# Patient Record
Sex: Male | Born: 1940 | Race: Black or African American | Hispanic: No | Marital: Married | Smoking: Current every day smoker
Health system: Southern US, Community
[De-identification: ages and names within clinical notes are randomized; demographics above are authoritative.]

## PROBLEM LIST (undated history)

## (undated) DIAGNOSIS — N289 Disorder of kidney and ureter, unspecified: Secondary | ICD-10-CM

## (undated) DIAGNOSIS — I10 Essential (primary) hypertension: Secondary | ICD-10-CM

## (undated) DIAGNOSIS — Z923 Personal history of irradiation: Secondary | ICD-10-CM

## (undated) DIAGNOSIS — C3490 Malignant neoplasm of unspecified part of unspecified bronchus or lung: Principal | ICD-10-CM

## (undated) DIAGNOSIS — Z9221 Personal history of antineoplastic chemotherapy: Secondary | ICD-10-CM

## (undated) HISTORY — DX: Malignant neoplasm of unspecified part of unspecified bronchus or lung: C34.90

## (undated) HISTORY — DX: Essential (primary) hypertension: I10

---

## 1959-06-14 HISTORY — PX: WRIST SURGERY: SHX841

## 1998-04-07 ENCOUNTER — Other Ambulatory Visit: Admission: RE | Admit: 1998-04-07 | Discharge: 1998-04-07 | Payer: Self-pay | Admitting: Gastroenterology

## 2004-07-07 ENCOUNTER — Ambulatory Visit: Payer: Self-pay | Admitting: Gastroenterology

## 2004-07-21 ENCOUNTER — Ambulatory Visit: Payer: Self-pay | Admitting: Gastroenterology

## 2005-07-08 ENCOUNTER — Encounter: Admission: RE | Admit: 2005-07-08 | Discharge: 2005-07-08 | Payer: Self-pay | Admitting: Emergency Medicine

## 2006-08-20 ENCOUNTER — Ambulatory Visit: Payer: Self-pay | Admitting: Cardiology

## 2006-08-30 ENCOUNTER — Encounter: Payer: Self-pay | Admitting: Cardiology

## 2006-08-30 ENCOUNTER — Ambulatory Visit (HOSPITAL_COMMUNITY): Admission: RE | Admit: 2006-08-30 | Discharge: 2006-08-30 | Payer: Self-pay | Admitting: Emergency Medicine

## 2009-03-21 ENCOUNTER — Emergency Department (HOSPITAL_COMMUNITY): Admission: EM | Admit: 2009-03-21 | Discharge: 2009-03-21 | Payer: Self-pay | Admitting: Emergency Medicine

## 2009-06-25 ENCOUNTER — Encounter (INDEPENDENT_AMBULATORY_CARE_PROVIDER_SITE_OTHER): Payer: Self-pay | Admitting: *Deleted

## 2010-02-04 ENCOUNTER — Telehealth: Payer: Self-pay | Admitting: Gastroenterology

## 2010-05-12 ENCOUNTER — Encounter (INDEPENDENT_AMBULATORY_CARE_PROVIDER_SITE_OTHER): Payer: Self-pay | Admitting: *Deleted

## 2010-05-14 ENCOUNTER — Encounter (INDEPENDENT_AMBULATORY_CARE_PROVIDER_SITE_OTHER): Payer: Self-pay | Admitting: *Deleted

## 2010-05-17 ENCOUNTER — Ambulatory Visit: Payer: Self-pay | Admitting: Gastroenterology

## 2010-05-26 ENCOUNTER — Ambulatory Visit: Payer: Self-pay | Admitting: Gastroenterology

## 2010-05-27 ENCOUNTER — Encounter: Payer: Self-pay | Admitting: Gastroenterology

## 2010-07-13 NOTE — Letter (Signed)
Summary: Colonoscopy Letter  [facility]  [address] [place], [place] [address]   Phone: [phone]  Fax: [phone]      June 25, 2009 MRN: [id number]   [name] [address] [place], [place]  [address]   Dear [name],   According to your medical record, it is time for you to schedule a Colonoscopy. The American Cancer Society recommends this procedure as a method to detect early colon cancer. Patients with a family history of colon cancer, or a personal history of colon polyps or inflammatory bowel disease are at increased risk.  This letter has beeen generated based on the recommendations made at the time of your procedure. If you feel that in your particular situation this may no longer apply, please contact our office.  Please call our office at [phone] to schedule this appointment or to update your records at your earliest convenience.  Thank you for cooperating with [place] to provide you with the very best care possible.   Sincerely,  Judie Petit T. Russella Dar, M.D.  [facility] Gastroenterology Division [phone]

## 2010-07-13 NOTE — Letter (Signed)
Summary: Pre Visit Letter Revised  [facility]  [address] [place], [place] [address]   Phone: [phone]  Fax: [phone]        05/12/2010 MRN: [id number] [name] [address] [place], [place]  [address]             Procedure Date:  05/26/2010  Welcome to the Gastroenterology Division at [facility].    You are scheduled to see a nurse for your pre-procedure visit on 05/17/2010 at 3:30PM on the 3rd floor at [facility], [address]  We ask that you try to arrive at our office 15 minutes prior to your appointment time to allow for check-in.  Please take a minute to review the attached form.  If you answer "Yes" to one or more of the questions on the first page, we ask that you call the person listed at your earliest opportunity.  If you answer "No" to all of the questions, please complete the rest of the form and bring it to your appointment.    Your nurse visit will consist of discussing your medical and surgical history, your immediate family medical history, and your medications.   If you are unable to list all of your medications on the form, please bring the medication bottles to your appointment and we will list them.  We will need to be aware of both prescribed and over the counter drugs.  We will need to know exact dosage information as well.    Please be prepared to read and sign documents such as consent forms, a financial agreement, and acknowledgement forms.  If necessary, and with your consent, a friend or relative is welcome to sit-in on the nurse visit with you.  Please bring your insurance card so that we may make a copy of it.  If your insurance requires a referral to see a specialist, please bring your referral form from your primary care physician.  No co-pay is required for this nurse visit.     If you cannot keep your appointment, please call [phone] to cancel or reschedule prior to your appointment date.  This allows [place]  the opportunity to schedule an appointment for another patient in need of care.    Thank you for choosing [facility] for your medical needs.  We appreciate the opportunity to care for you.  Please visit [place] at our website  to learn more about our practice.  Sincerely, The Gastroenterology Division

## 2010-07-13 NOTE — Letter (Signed)
Summary: [facility] Instructions  [facility]  [address] [place], [place] [address]   Phone: [phone]  Fax: [phone]       [name]    [birth year]    MRN: [id number]        Procedure Day Dorna Bloom:  [facility]  05/26/10     Arrival Time:  10:30AM     Procedure Time:  11:30AM     Location of Procedure:                    _ X_   Endoscopy Center (4th Floor)                      PREPARATION FOR COLONOSCOPY WITH MOVIPREP   Starting 5 days prior to your procedure 05/21/10 do not eat nuts, seeds, popcorn, corn, beans, peas,  salads, or any raw vegetables.  Do not take any fiber supplements (e.g. Metamucil, Citrucel, and Benefiber).  THE DAY BEFORE YOUR PROCEDURE         DATE: 05/25/10  DAY: TUESDAY  1.  Drink clear liquids the entire day-NO SOLID FOOD  2.  Do not drink anything colored red or purple.  Avoid juices with pulp.  No orange juice.  3.  Drink at least 64 oz. (8 glasses) of fluid/clear liquids during the day to prevent dehydration and help the prep work efficiently.  CLEAR LIQUIDS INCLUDE: Water Jello Ice Popsicles Tea (sugar ok, no milk/cream) Powdered fruit flavored drinks Coffee (sugar ok, no milk/cream) Gatorade Juice: apple, white grape, white cranberry  Lemonade Clear bullion, consomm, broth Carbonated beverages (any kind) Strained chicken noodle soup Hard Candy                             4.  In the morning, mix first dose of MoviPrep solution:    Empty 1 Pouch A and 1 Pouch B into the disposable container    Add lukewarm drinking water to the top line of the container. Mix to dissolve    Refrigerate (mixed solution should be used within 24 hrs)  5.  Begin drinking the prep at 5:00 p.m. The MoviPrep container is divided by 4 marks.   Every 15 minutes drink the solution down to the next mark (approximately 8 oz) until the full liter is complete.   6.  Follow completed prep with 16 oz of clear liquid of your choice  (Nothing red or purple).  Continue to drink clear liquids until bedtime.  7.  Before going to bed, mix second dose of MoviPrep solution:    Empty 1 Pouch A and 1 Pouch B into the disposable container    Add lukewarm drinking water to the top line of the container. Mix to dissolve    Refrigerate  THE DAY OF YOUR PROCEDURE      DATE: 05/26/10   DAY: WEDNESDAY  Beginning at 6:30AM (5 hours before procedure):         1. Every 15 minutes, drink the solution down to the next mark (approx 8 oz) until the full liter is complete.  2. Follow completed prep with 16 oz. of clear liquid of your choice.    3. You may drink clear liquids until 9:30AM (2 HOURS BEFORE PROCEDURE).   MEDICATION INSTRUCTIONS  Unless otherwise instructed, you should take regular prescription medications with a small sip of water   as early as possible the morning of  your procedure.   Additional medication instructions: _Hold HCTZ am of procedure         OTHER INSTRUCTIONS  You will need a responsible adult at least 71 years of age to accompany you and drive you home.   This person must remain in the waiting room during your procedure.  Wear loose fitting clothing that is easily removed.  Leave jewelry and other valuables at home.  However, you may wish to bring a book to read or  an iPod/MP3 player to listen to music as you wait for your procedure to start.  Remove all body piercing jewelry and leave at home.  Total time from sign-in until discharge is approximately 2-3 hours.  You should go home directly after your procedure and rest.  You can resume normal activities the  day after your procedure.  The day of your procedure you should not:   Drive   Make legal decisions   Operate machinery   Drink alcohol   Return to work  You will receive specific instructions about eating, activities and medications before you leave.    The above instructions have been reviewed and explained to me by    ___Suzanne Yetta Flock, RN____________________    I fully understand and can verbalize these instructions _____________________________ Date _________

## 2010-07-13 NOTE — Miscellaneous (Signed)
Summary: DIR COL...AS.  Clinical Lists Changes  Medications: Added new medication of MOVIPREP 100 GM  SOLR (PEG-KCL-NACL-NASULF-NA ASC-C) As per prep instructions. - Signed Rx of MOVIPREP 100 GM  SOLR (PEG-KCL-NACL-NASULF-NA ASC-C) As per prep instructions.;  #1 x 0;  Signed;  Entered by: Clide Cliff RN;  Authorized by: Meryl Dare MD [facility];  Method used: Electronically to [facility]  [phone]*, [address] [place], [place]  [address], Ph: [phone] or [phone], Fax: [phone] Observations: Added new observation of ALLERGY REV: Done (05/17/2010 15:28)    Prescriptions: MOVIPREP 100 GM  SOLR (PEG-KCL-NACL-NASULF-NA ASC-C) As per prep instructions.  #1 x 0   Entered by:   Clide Cliff RN   Authorized by:   Meryl Dare MD [facility]   Signed by:   Clide Cliff RN on 05/17/2010   Method used:   [facility] to        [phone] (retail)       [address] [place], [place]  [address]       Ph: [phone] or [phone]       Fax: [phone]   RxID:   [id number]

## 2010-07-13 NOTE — Progress Notes (Signed)
Summary: Schedule Colonoscopy  Phone Note Outgoing Call Call back at [facility] Phone [phone]   Call placed by: Harlow Mares CMA Duncan Dull),  February 04, 2010 2:56 PM Call placed to: Patient Summary of Call: Patients number is disconnected, we will mail them a letter to remind them they are due for their procedure and they need to call back and schedule.   Initial call taken by: Harlow Mares CMA Duncan Dull),  February 04, 2010 2:56 PM

## 2010-07-15 NOTE — Procedures (Signed)
Summary: Colonoscopy  Patient: [name] Note: All result statuses are Final unless otherwise noted.  Tests: (1) Colonoscopy (COL)   COL Colonoscopy           DONE (C)     [facility]     [address]     [place], [place]  [address]           COLONOSCOPY PROCEDURE REPORT     PATIENT:  [name], [name]  MR#:  [id number]     BIRTHDATE:  May 04, 1941, 68 yrs. old  GENDER:  male     ENDOSCOPIST:  Judie Petit T. Russella Dar, MD, [facility]           PROCEDURE DATE:  05/26/2010     PROCEDURE:  Colonoscopy with biopsy     ASA CLASS:  Class II     INDICATIONS:  1) surveillance and high-risk screening 2) history     of adenomatous colon polyps, 1996      colon cancer: sister in mid 55s.     MEDICATIONS:   Fentanyl 50 mcg IV, Versed 6 mg IV     DESCRIPTION OF PROCEDURE:   After the risks benefits and     alternatives of the procedure were thoroughly explained, informed     consent was obtained.  Digital rectal exam was performed and     revealed no abnormalities.   The LB [id number]-[id number] endoscope     was introduced through the anus and advanced to the cecum, which     was identified by both the appendix and ileocecal valve, without     limitations.  The quality of the prep was good, using MoviPrep.     The instrument was then slowly withdrawn as the colon was fully     examined.     <<PROCEDUREIMAGES>>     FINDINGS:  A sessile polyp was found at the hepatic flexure. It     was 4 mm in size. The polyp was removed using cold biopsy forceps.     A sessile polyp was found in the sigmoid colon. It was 3 mm in     size. The polyp was removed using cold biopsy forceps.  A normal     appearing cecum, ileocecal valve, and appendiceal orifice were     identified. The ascending, transverse, splenic flexure, descending     colon, and rectum appeared unremarkable.  Retroflexed views in the     rectum revealed internal hemorrhoids, small. The time to cecum =     2  minutes. The scope was then  withdrawn (time =  8.67  min) from     the patient and the procedure completed.     COMPLICATIONS:  None           ENDOSCOPIC IMPRESSION:     1) 4 mm sessile polyp at the hepatic flexure     2) 3 mm sessile polyp in the sigmoid colon     3) Internal hemorrhoids           RECOMMENDATIONS:     1) Await pathology results     2) Repeat Colonoscopy in 5 years pending pathology review.           Venita Lick. Russella Dar, MD, [facility]           CC: Antony Haste, MD           n.     REVISED:  05/26/2010 11:24 AM     eSIGNED:  Venita Lick. Ellianne Gowen at 05/26/2010 11:24 AM           [name], [id number]  Note: An exclamation mark (!) indicates a result that was not dispersed into the flowsheet. Document Creation Date: 05/26/2010 11:25 AM _______________________________________________________________________  (1) Order result status: Final Collection or observation date-time: 05/26/2010 11:09 Requested date-time:  Receipt date-time:  Reported date-time:  Referring Physician:   Ordering Physician: Claudette Head [phone]) Specimen Source:  Source: Launa Grill Order Number: [id number] Lab site:   Appended Document: Colonoscopy     Procedures Next Due Date:    Colonoscopy: 05/2015

## 2010-07-15 NOTE — Letter (Signed)
Summary: Patient Notice- Polyp Results  [facility]  [address] [place], [place] [address]   Phone: [phone]  Fax: [phone]        May 27, 2010 MRN: [id number]    [name] [address] [place], [place]  [address]    Dear [name],  I am pleased to inform you that the colon polyp(s) removed during your recent colonoscopy was (were) found to be benign (no cancer detected) upon pathologic examination.  I recommend you have a repeat colonoscopy examination in 5 years to look for recurrent polyps, as having colon polyps increases your risk for having recurrent polyps or even colon cancer in the future.  Should you develop new or worsening symptoms of abdominal pain, bowel habit changes or bleeding from the rectum or bowels, please schedule an evaluation with either your primary care physician or with me.  Continue treatment plan as outlined the day of your exam.  Please call us if you are having persistent problems or have questions about your condition that have not been fully answered at this time.  Sincerely,  Meryl Dare MD [facility]  This letter has been electronically signed by your physician.  Appended Document: Patient Notice- Polyp Results Letter Mailed

## 2010-09-16 LAB — GLUCOSE, CAPILLARY: Glucose-Capillary: 105 mg/dL — ABNORMAL HIGH (ref 70–99)

## 2010-09-16 LAB — POCT I-STAT, CHEM 8
Hemoglobin: 19.4 g/dL — ABNORMAL HIGH (ref 13.0–17.0)
Potassium: 4.7 mEq/L (ref 3.5–5.1)
Sodium: 135 mEq/L (ref 135–145)

## 2012-01-13 ENCOUNTER — Telehealth: Payer: Self-pay | Admitting: *Deleted

## 2012-01-13 NOTE — Telephone Encounter (Signed)
Called pt regarding referral.  Unable to leave message

## 2012-01-16 ENCOUNTER — Other Ambulatory Visit: Payer: Self-pay | Admitting: Emergency Medicine

## 2012-01-16 DIAGNOSIS — R918 Other nonspecific abnormal finding of lung field: Secondary | ICD-10-CM | POA: Insufficient documentation

## 2012-01-16 DIAGNOSIS — R222 Localized swelling, mass and lump, trunk: Secondary | ICD-10-CM

## 2012-01-17 ENCOUNTER — Encounter: Payer: Self-pay | Admitting: Emergency Medicine

## 2012-01-17 ENCOUNTER — Ambulatory Visit (INDEPENDENT_AMBULATORY_CARE_PROVIDER_SITE_OTHER): Payer: Medicare Other | Admitting: Emergency Medicine

## 2012-01-17 ENCOUNTER — Other Ambulatory Visit (INDEPENDENT_AMBULATORY_CARE_PROVIDER_SITE_OTHER): Payer: Medicare Other

## 2012-01-17 VITALS — BP 126/80 | HR 69 | Temp 98.9°F | Ht 73.0 in | Wt 215.0 lb

## 2012-01-17 DIAGNOSIS — Z01810 Encounter for preprocedural cardiovascular examination: Secondary | ICD-10-CM | POA: Insufficient documentation

## 2012-01-17 DIAGNOSIS — Z01818 Encounter for other preprocedural examination: Secondary | ICD-10-CM | POA: Insufficient documentation

## 2012-01-17 DIAGNOSIS — T39095A Adverse effect of salicylates, initial encounter: Secondary | ICD-10-CM | POA: Insufficient documentation

## 2012-01-17 DIAGNOSIS — Z0189 Encounter for other specified special examinations: Secondary | ICD-10-CM | POA: Insufficient documentation

## 2012-01-17 DIAGNOSIS — R222 Localized swelling, mass and lump, trunk: Secondary | ICD-10-CM

## 2012-01-17 LAB — BASIC METABOLIC PANEL
Calcium: 9.8 mg/dL (ref 8.4–10.5)
Creatinine, Ser: 1.3 mg/dL (ref 0.4–1.5)
GFR: 71.76 mL/min (ref >60.00)

## 2012-01-17 LAB — CBC
HCT: 48.3 % (ref 39.0–52.0)
Hemoglobin: 16.5 g/dL (ref 13.0–17.0)
MCV: 97.1 fl (ref 78.0–100.0)
RBC: 4.98 Mil/uL (ref 4.22–5.81)

## 2012-01-17 MED ORDER — MUPIROCIN 2 % EX OINT: TOPICAL_OINTMENT | Freq: Two times a day (BID) | CUTANEOUS | Status: DC

## 2012-01-17 NOTE — Assessment & Plan Note (Signed)
RLL mass in smoker, high risk for primary lung CA. Plan will be for FOB + EBUS tomorrow 8/7 at 8:30 - check labs now - he will be contacted by Short Stay to get details on arrival time

## 2012-01-17 NOTE — Progress Notes (Addendum)
PCP is Dr Cyndia Bent.  Pt does not see a cardiologist and has not had a 2D Echo or stress test.  Additional history that could not be entered: hx of blood in urine, had a UTI was treated and still had blood in urine. While being worked up for blood in urine an area was seen on his lung- so a CT of chest was done.  Pt has had a colonoscopy and had a fracture wrist repaired in surgery.

## 2012-01-17 NOTE — Patient Instructions (Addendum)
We will perform bronchoscopy with biopsies of your lung on Wednesday 01/18/12.  You will be contacted by Short Stay at [facility] to discuss the timing and your medications.

## 2012-01-17 NOTE — Progress Notes (Signed)
Subjective:     Patient ID: [name], male   DOB: 12/30/1940, 71 y.o.   MRN: [id number]  HPI 71 yo man smoker, was being evaluated by Dr Grapey for acute-on-chronic hematuria. He was having difficulty urinating, so this prompted cystoscopy and CT scan of the abdomen. The scan identified a RLL lesion which prompted CT scan of the chest - showed RLL mass with mediastinal LAD. Presents now for further eval.   Review of Systems  Constitutional: Negative for fever, chills, diaphoresis, activity change, appetite change, fatigue and unexpected weight change.  HENT: Positive for congestion, rhinorrhea and postnasal drip.   Respiratory: Positive for cough, shortness of breath and wheezing. Negative for choking and stridor.   Genitourinary: Positive for hematuria.    Past Medical History  Diagnosis Date  . Hypertension   . ALLERGIC RHINITIS      Family History  Problem Relation Age of Onset  . Hypertension Sister   . Hypertension Brother   . Hypertension Mother   . Coronary artery disease Mother      History   Social History  . Marital Status: Married    Spouse Name: N/A    Number of Children: N/A  . Years of Education: N/A   Occupational History  . Not on file.   Social History Main Topics  . Smoking status: Current Everyday Smoker -- 0.5 packs/day for 50 years    Types: Cigarettes  . Smokeless tobacco: Never Used  . Alcohol Use: No  . Drug Use: Not on file  . Sexually Active: Not on file   Other Topics Concern  . Not on file   Social History Narrative  . No narrative on file     No Known Allergies   Current Outpatient Prescriptions on File Prior to Visit  Medication Sig Dispense Refill  . doxazosin (CARDURA) 8 MG tablet Take 8 mg by mouth at bedtime.      . olmesartan-hydrochlorothiazide (BENICAR HCT) 40-12.5 MG per tablet Take 1 tablet by mouth daily.      . amLODipine (NORVASC) 5 MG tablet Take 5 mg by mouth daily.            Objective:   Physical Exam    Constitutional: He is oriented to person, place, and time. He appears well-developed and well-nourished. No distress.  HENT:  Head: Normocephalic and atraumatic.  Mouth/Throat: Oropharynx is clear and moist. No oropharyngeal exudate.       M2 airway  Eyes: Pupils are equal, round, and reactive to light. Right eye exhibits no discharge. Left eye exhibits no discharge. No scleral icterus.  Neck: Normal range of motion. No tracheal deviation present. No thyromegaly present.  Cardiovascular: Normal rate, regular rhythm and normal heart sounds.  Exam reveals no gallop and no friction rub.   No murmur heard. Pulmonary/Chest: Effort normal and breath sounds normal. No stridor. No respiratory distress. He has no wheezes. He has no rales. He exhibits no tenderness.  Abdominal: Soft. He exhibits no distension. There is no tenderness. There is no guarding.  Musculoskeletal: Normal range of motion. He exhibits no edema.  Neurological: He is alert and oriented to person, place, and time.  Skin: No rash noted. He is not diaphoretic. No erythema.  Psychiatric: He has a normal mood and affect.       Assessment:     Chest mass RLL mass in smoker, high risk for primary lung CA. Plan will be for FOB + EBUS tomorrow 8/7 at 8:30 - check   labs now - he will be contacted by Short Stay to get details on arrival time       

## 2012-01-18 ENCOUNTER — Encounter (HOSPITAL_COMMUNITY): Payer: Self-pay | Admitting: *Deleted

## 2012-01-18 ENCOUNTER — Ambulatory Visit (HOSPITAL_COMMUNITY): Payer: Medicare Other | Admitting: Anesthesiology

## 2012-01-18 ENCOUNTER — Ambulatory Visit (HOSPITAL_COMMUNITY)
Admission: RE | Admit: 2012-01-18 | Discharge: 2012-01-18 | Disposition: A | Discharge status: Home / Self Care | Payer: Medicare Other | Source: Ambulatory Visit | Attending: Emergency Medicine | Admitting: Emergency Medicine

## 2012-01-18 ENCOUNTER — Ambulatory Visit (HOSPITAL_COMMUNITY): Payer: Medicare Other

## 2012-01-18 ENCOUNTER — Encounter (HOSPITAL_COMMUNITY)
Admission: RE | Disposition: A | Discharge status: Home / Self Care | Payer: Self-pay | Source: Ambulatory Visit | Attending: Emergency Medicine

## 2012-01-18 ENCOUNTER — Encounter (HOSPITAL_COMMUNITY): Payer: Self-pay | Admitting: Anesthesiology

## 2012-01-18 DIAGNOSIS — R918 Other nonspecific abnormal finding of lung field: Secondary | ICD-10-CM

## 2012-01-18 DIAGNOSIS — R222 Localized swelling, mass and lump, trunk: Secondary | ICD-10-CM

## 2012-01-18 DIAGNOSIS — F411 Generalized anxiety disorder: Secondary | ICD-10-CM | POA: Insufficient documentation

## 2012-01-18 DIAGNOSIS — C3490 Malignant neoplasm of unspecified part of unspecified bronchus or lung: Secondary | ICD-10-CM | POA: Insufficient documentation

## 2012-01-18 DIAGNOSIS — F17200 Nicotine dependence, unspecified, uncomplicated: Secondary | ICD-10-CM | POA: Insufficient documentation

## 2012-01-18 DIAGNOSIS — I10 Essential (primary) hypertension: Secondary | ICD-10-CM | POA: Insufficient documentation

## 2012-01-18 LAB — COMPREHENSIVE METABOLIC PANEL
ALT: 20 U/L (ref 0–53)
Alkaline Phosphatase: 61 U/L (ref 39–117)
CO2: 25 mEq/L (ref 19–32)
Calcium: 10.2 mg/dL (ref 8.4–10.5)
GFR calc Af Amer: 58 mL/min — ABNORMAL LOW (ref >90)
GFR calc non Af Amer: 50 mL/min — ABNORMAL LOW (ref >90)
Glucose, Bld: 124 mg/dL — ABNORMAL HIGH (ref 70–99)
Potassium: 4.1 mEq/L (ref 3.5–5.1)
Sodium: 140 mEq/L (ref 135–145)

## 2012-01-18 LAB — CBC
Hemoglobin: 18 g/dL — ABNORMAL HIGH (ref 13.0–17.0)
MCH: 33.9 pg (ref 26.0–34.0)
RBC: 5.31 MIL/uL (ref 4.22–5.81)

## 2012-01-18 SURGERY — BRONCHOSCOPY, WITH EBUS
Anesthesia: General | Laterality: Right

## 2012-01-18 MED ORDER — ONDANSETRON HCL 4 MG/2ML IJ SOLN
INTRAMUSCULAR | Status: DC | PRN
  Administered 2012-01-18: 4 mg via INTRAVENOUS

## 2012-01-18 MED ORDER — LIDOCAINE HCL 4 % MT SOLN
OROMUCOSAL | Status: DC | PRN
  Administered 2012-01-18: 4 mL via TOPICAL

## 2012-01-18 MED ORDER — 0.9 % SODIUM CHLORIDE (POUR BTL) OPTIME
TOPICAL | Status: DC | PRN
  Administered 2012-01-18: 1000 mL

## 2012-01-18 MED ORDER — FENTANYL CITRATE 0.05 MG/ML IJ SOLN
INTRAMUSCULAR | Status: DC | PRN
  Administered 2012-01-18: 50 ug via INTRAVENOUS
  Administered 2012-01-18: 100 ug via INTRAVENOUS

## 2012-01-18 MED ORDER — PROPOFOL 10 MG/ML IV EMUL
INTRAVENOUS | Status: DC | PRN
  Administered 2012-01-18: 180 mg via INTRAVENOUS
  Administered 2012-01-18 (×2): 60 mg via INTRAVENOUS

## 2012-01-18 MED ORDER — NEOSTIGMINE METHYLSULFATE 1 MG/ML IJ SOLN
INTRAMUSCULAR | Status: DC | PRN
  Administered 2012-01-18: 5 mg via INTRAVENOUS

## 2012-01-18 MED ORDER — MIDAZOLAM HCL 5 MG/5ML IJ SOLN
INTRAMUSCULAR | Status: DC | PRN
  Administered 2012-01-18: 2 mg via INTRAVENOUS

## 2012-01-18 MED ORDER — HYDROMORPHONE HCL PF 1 MG/ML IJ SOLN: 0.2500 mg | INTRAMUSCULAR | Status: DC | PRN

## 2012-01-18 MED ORDER — ROCURONIUM BROMIDE 100 MG/10ML IV SOLN
INTRAVENOUS | Status: DC | PRN
  Administered 2012-01-18: 50 mg via INTRAVENOUS

## 2012-01-18 MED ORDER — LACTATED RINGERS IV SOLN
INTRAVENOUS | Status: DC | PRN
  Administered 2012-01-18: 08:00:00 via INTRAVENOUS

## 2012-01-18 MED ORDER — LIDOCAINE HCL (CARDIAC) 20 MG/ML IV SOLN
INTRAVENOUS | Status: DC | PRN
  Administered 2012-01-18: 100 mg via INTRAVENOUS

## 2012-01-18 MED ORDER — PHENYLEPHRINE HCL 10 MG/ML IJ SOLN
INTRAMUSCULAR | Status: DC | PRN
  Administered 2012-01-18: 80 ug via INTRAVENOUS

## 2012-01-18 MED ORDER — GLYCOPYRROLATE 0.2 MG/ML IJ SOLN
INTRAMUSCULAR | Status: DC | PRN
  Administered 2012-01-18: .6 mg via INTRAVENOUS

## 2012-01-18 SURGICAL SUPPLY — 23 items
BRUSH CYTOL CELLEBRITY 1.5X140 (MISCELLANEOUS) IMPLANT
CANISTER SUCTION 2500CC (MISCELLANEOUS) ×2 IMPLANT
CLOTH BEACON ORANGE TIMEOUT ST (SAFETY) ×2 IMPLANT
CONT SPEC 4OZ CLIKSEAL STRL BL (MISCELLANEOUS) ×2 IMPLANT
COVER TABLE BACK 60X90 (DRAPES) ×2 IMPLANT
FORCEPS BIOP RJ4 1.8 (CUTTING FORCEPS) IMPLANT
GLOVE SURG SIGNA 7.5 PF LTX (GLOVE) ×2 IMPLANT
KIT ROOM TURNOVER OR (KITS) ×2 IMPLANT
MARKER SKIN DUAL TIP RULER LAB (MISCELLANEOUS) ×2 IMPLANT
NDL BIOPSY TRANSBRONCH 21G (NEEDLE) IMPLANT
NEEDLE BIOPSY TRANSBRONCH 21G (NEEDLE) IMPLANT
NEEDLE SYS SONOTIP II EBUSTBNA (NEEDLE) IMPLANT
NS IRRIG 1000ML POUR BTL (IV SOLUTION) ×2 IMPLANT
OIL SILICONE PENTAX (PARTS (SERVICE/REPAIRS)) IMPLANT
PAD ARMBOARD 7.5X6 YLW CONV (MISCELLANEOUS) ×4 IMPLANT
SPONGE GAUZE 4X4 12PLY (GAUZE/BANDAGES/DRESSINGS) IMPLANT
STOPCOCK 4 WAY LG BORE MALE ST (IV SETS) ×1 IMPLANT
SYR 20CC LL (SYRINGE) ×2 IMPLANT
SYR 20ML ECCENTRIC (SYRINGE) ×4 IMPLANT
SYR 5ML LUER SLIP (SYRINGE) ×2 IMPLANT
TOWEL OR 17X24 6PK STRL BLUE (TOWEL DISPOSABLE) ×2 IMPLANT
TRAP SPECIMEN MUCOUS 40CC (MISCELLANEOUS) ×2 IMPLANT
TUBE CONNECTING 12X1/4 (SUCTIONS) ×2 IMPLANT

## 2012-01-18 NOTE — Transfer of Care (Signed)
Immediate Anesthesia Transfer of Care Note  Patient: [name]  Procedure(s) Performed: Procedure(s) (LRB): VIDEO BRONCHOSCOPY WITH ENDOBRONCHIAL ULTRASOUND (Right)  Patient Location: PACU  Anesthesia Type: General  Level of Consciousness: awake and sedated  Airway & Oxygen Therapy: Patient Spontanous Breathing and Patient connected to face mask oxygen  Post-op Assessment: Report given to PACU RN, Post -op Vital signs reviewed and stable, Patient moving all extremities and Patient moving all extremities X 4  Post vital signs: Reviewed and stable  Complications: No apparent anesthesia complications

## 2012-01-18 NOTE — Interval H&P Note (Signed)
PCCM Interval Hx:  Mr Klemens presents today for FOB + EBUS of RLL lesion. He feels well, is NPO. Review of labs this am shows some renal insufficiency. 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 He denies any CP, dyspnea or other cardiac sx. Apparently a TTE was performed in 2008 that showed intact LV fxn - ordered for a murmur. I believe he can undergo anesthesia.   Filed Vitals:   01/18/12 0719  BP: 164/90  Pulse: 66  Temp: 98.1 F (36.7 C)  Resp: 18   BMET    Component Value Date/Time   NA 140 01/18/2012 0711   K 4.1 01/18/2012 0711   CL 106 01/18/2012 0711   CO2 25 01/18/2012 0711   GLUCOSE 124* 01/18/2012 0711   BUN 22 01/18/2012 0711   CREATININE 1.38* 01/18/2012 0711   CALCIUM 10.2 01/18/2012 0711   GFRNONAA 50* 01/18/2012 0711   GFRAA 58* 01/18/2012 0711   CBC    Component Value Date/Time   WBC 5.2 01/18/2012 0711   RBC 5.31 01/18/2012 0711   HGB 18.0* 01/18/2012 0711   HCT 49.6 01/18/2012 0711   PLT 131* 01/18/2012 0711   MCV 93.4 01/18/2012 0711   MCH 33.9 01/18/2012 0711   MCHC 36.3* 01/18/2012 0711   RDW 12.6 01/18/2012 0711   Levy Pupa, MD, PhD 01/18/2012, 8:25 AM [facility] [phone] or if no answer [phone]

## 2012-01-18 NOTE — Preoperative (Signed)
Beta Blockers   Reason not to administer Beta Blockers:Not Applicable 

## 2012-01-18 NOTE — Progress Notes (Signed)
Notified Dr.Crews of pt's ekg results. Pt. Denies sob or chest pain. States his pcp did ekg on last check up about 1-1 1/2 months ago. Dr.Crews states they will evaluate pt. In holding.

## 2012-01-18 NOTE — Anesthesia Preprocedure Evaluation (Addendum)
Anesthesia Evaluation  Patient identified by MRN, date of birth, ID band Patient awake    Reviewed: Allergy & Precautions, H&P , NPO status , Patient's Chart, lab work & pertinent test results  History of Anesthesia Complications Negative for: history of anesthetic complications  Airway Mallampati: II TM Distance: >3 FB Neck ROM: Full    Dental  (+) Edentulous Upper   Pulmonary Current Smoker,  .5 ppd breath sounds clear to auscultation  Pulmonary exam normal       Cardiovascular hypertension, Rhythm:Irregular Rate:Normal     Neuro/Psych Anxiety negative neurological ROS     GI/Hepatic negative GI ROS, Neg liver ROS,   Endo/Other  negative endocrine ROS  Renal/GU negative Renal ROS     Musculoskeletal negative musculoskeletal ROS (+)   Abdominal   Peds  Hematology negative hematology ROS (+)   Anesthesia Other Findings   Reproductive/Obstetrics                          Anesthesia Physical Anesthesia Plan  ASA: III  Anesthesia Plan: General   Post-op Pain Management:    Induction: Intravenous  Airway Management Planned: Oral ETT  Additional Equipment:   Intra-op Plan:   Post-operative Plan: Extubation in OR  Informed Consent: I have reviewed the patients History and Physical, chart, labs and discussed the procedure including the risks, benefits and alternatives for the proposed anesthesia with the patient or authorized representative who has indicated his/her understanding and acceptance.   Dental advisory given  Plan Discussed with: CRNA, Anesthesiologist and Surgeon  Anesthesia Plan Comments:        Anesthesia Quick Evaluation

## 2012-01-18 NOTE — Op Note (Addendum)
Video Bronchoscopy with Endobronchial Ultrasound Procedure Note  Date of Operation: 01/18/2012  Pre-op Diagnosis: RLL mass  Post-op Diagnosis: Small Cell Lung Cancer  Surgeon: Levy Pupa  Assistants: none  Anesthesia: General endotracheal anesthesia  Operation: Flexible video fiberoptic bronchoscopy with endobronchial ultrasound and biopsies.  Estimated Blood Loss: 10cc  Complications: None apparent  Indications and History: [name] is a 71 y.o. male with hematuria who underwent CT scan of the abd/pelvis that revealed probable RLL mass. A CT scan of the chest was performed that confirmed a RLL mass as well as R hilar LAD. FOB + EBUS was recommended for biopsy.  The risks, benefits, complications, treatment options and expected outcomes were discussed with the patient.  The possibilities of pneumothorax, pneumonia, reaction to medication, pulmonary aspiration, perforation of a viscus, bleeding, failure to diagnose a condition and creating a complication requiring transfusion or operation were discussed with the patient who freely signed the consent.    Description of Procedure: The patient was examined in the preoperative area and history and data from the preprocedure consultation were reviewed. It was deemed appropriate to proceed.  The patient was taken to OR10 at [facility], identified as [name] and the procedure verified as Flexible Video Fiberoptic Bronchoscopy.  A Time Out was held and the above information confirmed. After being taken to the operating room general anesthesia was initiated and the patient  was orally intubated. The video fiberoptic bronchoscope was introduced via the endotracheal tube and a general inspection was performed which showed normal airways with the exception of some thickening of the central RLL carina. Endobronchial biopsies were performed on the RLL carina to be sent for pathology. The standard scope was then withdrawn and the endobronchial ultrasound  was used to identify and characterize the hilar and bronchial lymph nodes. Inspection showed Significant enlargement of Station 7, Station 10R and 11R nodes corresponding with his CT scan findings. We were also able to localize his RLL mass.  Using real-time ultrasound guidance Wang needle biopsies were take from the RLL mass and from Station 11R, 10R and 7 nodes and were sent for cytology. Preliminary data suggested possible Small Cell Lung Cancer, but the final path interpretation is pending. The patient tolerated the procedure well without apparent complications. There was no significant blood loss. The bronchoscope was withdrawn. Anesthesia was reversed and the patient was taken to the PACU for recovery.   Samples: 1. Wang needle biopsies from RLL mass 2. Wang needle biopsies from 10R node 3. Wang needle biopsies from 11R node 4. Wang needle biopsies from 7 node 5. RLL carina endobronchial biopsies.   Plans:  The patient will be discharged from the PACU to home when recovered from anesthesia. We will review the cytology and pathology results with the patient when they become available. Outpatient followup will be with Dr Delton Coombes and with [facility].   Levy Pupa, MD, PhD 01/18/2012, 10:13 AM [facility] [phone] or if no answer [phone]  Addendum:   Pathology results available: Cytology from EBUS Wang needle biopsies show Small Cell Lung Cancer.   Levy Pupa, MD, PhD 01/25/2012, 4:02 PM [facility] [phone] or if no answer [phone]

## 2012-01-18 NOTE — Anesthesia Postprocedure Evaluation (Signed)
  Anesthesia Post-op Note  Patient: [name]  Procedure(s) Performed: Procedure(s) (LRB): VIDEO BRONCHOSCOPY WITH ENDOBRONCHIAL ULTRASOUND (Right)  Patient Location: PACU  Anesthesia Type: General  Level of Consciousness: awake  Airway and Oxygen Therapy: Patient Spontanous Breathing  Post-op Pain: mild  Post-op Assessment: Post-op Vital signs reviewed  Post-op Vital Signs: Reviewed  Complications: No apparent anesthesia complications

## 2012-01-18 NOTE — H&P (View-Only) (Signed)
Subjective:     Patient ID: [name], male   DOB: July 17, 1940, 71 y.o.   MRN: [id number]  HPI 71 yo man smoker, was being evaluated by Dr Isabel Caprice for acute-on-chronic hematuria. He was having difficulty urinating, so this prompted cystoscopy and CT scan of the abdomen. The scan identified a RLL lesion which prompted CT scan of the chest - showed RLL mass with mediastinal LAD. Presents now for further eval.   Review of Systems  Constitutional: Negative for fever, chills, diaphoresis, activity change, appetite change, fatigue and unexpected weight change.  HENT: Positive for congestion, rhinorrhea and postnasal drip.   Respiratory: Positive for cough, shortness of breath and wheezing. Negative for choking and stridor.   Genitourinary: Positive for hematuria.    Past Medical History  Diagnosis Date  . Hypertension   . ALLERGIC RHINITIS      Family History  Problem Relation Age of Onset  . Hypertension Sister   . Hypertension Brother   . Hypertension Mother   . Coronary artery disease Mother      History   Social History  . Marital Status: Married    Spouse Name: N/A    Number of Children: N/A  . Years of Education: N/A   Occupational History  . Not on file.   Social History Main Topics  . Smoking status: Current Everyday Smoker -- 0.5 packs/day for 50 years    Types: Cigarettes  . Smokeless tobacco: Never Used  . Alcohol Use: No  . Drug Use: Not on file  . Sexually Active: Not on file   Other Topics Concern  . Not on file   Social History Narrative  . No narrative on file     No Known Allergies   Current Outpatient Prescriptions on File Prior to Visit  Medication Sig Dispense Refill  . doxazosin (CARDURA) 8 MG tablet Take 8 mg by mouth at bedtime.      Marland Kitchen olmesartan-hydrochlorothiazide (BENICAR HCT) 40-12.5 MG per tablet Take 1 tablet by mouth daily.      Marland Kitchen amLODipine (NORVASC) 5 MG tablet Take 5 mg by mouth daily.            Objective:   Physical Exam    Constitutional: He is oriented to person, place, and time. He appears well-developed and well-nourished. No distress.  HENT:  Head: Normocephalic and atraumatic.  Mouth/Throat: Oropharynx is clear and moist. No oropharyngeal exudate.       M2 airway  Eyes: Pupils are equal, round, and reactive to light. Right eye exhibits no discharge. Left eye exhibits no discharge. No scleral icterus.  Neck: Normal range of motion. No tracheal deviation present. No thyromegaly present.  Cardiovascular: Normal rate, regular rhythm and normal heart sounds.  Exam reveals no gallop and no friction rub.   No murmur heard. Pulmonary/Chest: Effort normal and breath sounds normal. No stridor. No respiratory distress. He has no wheezes. He has no rales. He exhibits no tenderness.  Abdominal: Soft. He exhibits no distension. There is no tenderness. There is no guarding.  Musculoskeletal: Normal range of motion. He exhibits no edema.  Neurological: He is alert and oriented to person, place, and time.  Skin: No rash noted. He is not diaphoretic. No erythema.  Psychiatric: He has a normal mood and affect.       Assessment:     Chest mass RLL mass in smoker, high risk for primary lung CA. Plan will be for FOB + EBUS tomorrow 8/7 at 8:30 - check  labs now - he will be contacted by Short Stay to get details on arrival time

## 2012-01-19 ENCOUNTER — Telehealth: Payer: Self-pay | Admitting: Internal Medicine

## 2012-01-19 NOTE — Telephone Encounter (Signed)
pt aware of new pt appt     aom °

## 2012-01-20 ENCOUNTER — Telehealth: Payer: Self-pay | Admitting: Emergency Medicine

## 2012-01-20 ENCOUNTER — Telehealth: Payer: Self-pay | Admitting: Internal Medicine

## 2012-01-20 NOTE — Telephone Encounter (Signed)
Spoke with the patient and his wife, gave dx of small cell lung CA. He has an appt with Dr Arbutus Ped next Monday at 1:00pm to discuss therapy.

## 2012-01-20 NOTE — Telephone Encounter (Signed)
Referred by Dr. Isabel Caprice Dx- Lung Ca. NP packet mailed out.

## 2012-01-23 ENCOUNTER — Telehealth: Payer: Self-pay | Admitting: Internal Medicine

## 2012-01-23 ENCOUNTER — Ambulatory Visit (HOSPITAL_BASED_OUTPATIENT_CLINIC_OR_DEPARTMENT_OTHER): Payer: Medicare Other | Admitting: Internal Medicine

## 2012-01-23 ENCOUNTER — Telehealth: Payer: Self-pay | Admitting: Medical Oncology

## 2012-01-23 ENCOUNTER — Ambulatory Visit: Payer: Medicare Other

## 2012-01-23 ENCOUNTER — Encounter: Payer: Self-pay | Admitting: Internal Medicine

## 2012-01-23 ENCOUNTER — Other Ambulatory Visit (HOSPITAL_BASED_OUTPATIENT_CLINIC_OR_DEPARTMENT_OTHER): Payer: Medicare Other | Admitting: Lab

## 2012-01-23 VITALS — BP 159/97 | HR 42 | Temp 96.9°F | Resp 20 | Ht 73.0 in | Wt 253.9 lb

## 2012-01-23 DIAGNOSIS — C3490 Malignant neoplasm of unspecified part of unspecified bronchus or lung: Secondary | ICD-10-CM

## 2012-01-23 DIAGNOSIS — C7A1 Malignant poorly differentiated neuroendocrine tumors: Secondary | ICD-10-CM

## 2012-01-23 HISTORY — DX: Malignant neoplasm of unspecified part of unspecified bronchus or lung: C34.90

## 2012-01-23 LAB — CBC WITH DIFFERENTIAL/PLATELET
Basophils Absolute: 0 10*3/uL (ref 0.0–0.1)
EOS%: 4.2 % (ref 0.0–7.0)
HCT: 48.2 % (ref 38.4–49.9)
HGB: 16.5 g/dL (ref 13.0–17.1)
MCH: 33.2 pg (ref 27.2–33.4)
MCV: 97.1 fL (ref 79.3–98.0)
MONO%: 10.9 % (ref 0.0–14.0)
NEUT%: 53.9 % (ref 39.0–75.0)

## 2012-01-23 LAB — COMPREHENSIVE METABOLIC PANEL
AST: 20 U/L (ref 0–37)
Alkaline Phosphatase: 50 U/L (ref 39–117)
BUN: 19 mg/dL (ref 6–23)
Creatinine, Ser: 1.45 mg/dL — ABNORMAL HIGH (ref 0.50–1.35)

## 2012-01-23 NOTE — Telephone Encounter (Signed)
Pt understood that Dr Donnald Garre was going to call in nausea med. -[facility] [address] . I will have Dr Donnald Garre nurse f/u in am

## 2012-01-23 NOTE — Progress Notes (Signed)
[facility] [phone]   [phone]  CONSULT NOTE  REASON FOR CONSULTATION:  71 years old [place] American male diagnosed with lung cancer. HPI [name] is a 71 y.o. male was past medical history significant for hypertension and allergic rhinitis as well as long history of smoking. The patient has been complaining of microscopic hematuria for long time. He was recently evaluated by Dr. Isabel Caprice and underwent cystoscopy as well as CT scan of the abdomen and pelvis which was performed on 12/08/2011. It showed no evidence of urinary tract neoplasm, urolithiasis or hydronephrosis was mildly enlarged prostate. It also showed incomplete visualization of the central right lower lobe mass versus infrahilar lymphadenopathy with probable postobstructive pneumonitis in the peripheral right lower lobe in addition to an 8 mm noncalcified pulmonary nodule in the left lower lobe suspicious for malignancy. This was followed by CT scan of the chest on 01/10/2012 and it showed right infrahilar mass measuring 2.3 x 3.2 CM. There was right hilar and subcarinal adenopathy with a findings suspicious for primary bronchogenic neoplasm over metastatic disease. There was also airspace disease/groundglass attenuation in the anterior right lower lobe suspicious for endobronchial spread of tumor which is favored over postobstructive pneumonia. The patient was referred to Dr. Delton Coombes and on 01/18/2012 he underwent flexible video fiberoptic bronchoscopy with endobronchial ultrasound and biopsies. The final pathology showed malignant cells, consistent with high grade, poorly differentiated neuroendocrine carcinoma, small cell type. Dr. Delton Coombes kindly referred the patient to me today for further evaluation and recommendation regarding treatment of his condition. When seen today the patient is doing fine except for shortness breath with exertion as well as low back pain and cough productive of whitish  sputum. He denied having any significant chest pain or hemoptysis. He has no significant weight loss or night sweats or lack of appetite no significant visual changes or headache.  He has no family history of lung cancer. The patient is married and has 3 children. He was accompanied by his wife and her 2 caregiver. The patient has a history of smoking half a pack per day for around 55 years and unfortunately continues to smoke, no history of alcohol or drug abuse. @SFHPI @  Past Medical History  Diagnosis Date  . Hypertension   . ALLERGIC RHINITIS   . Malignant neoplasm of bronchus and lung, unspecified site 01/23/2012    Past Surgical History  Procedure Date  . Wrist surgery 1961    right wrist    Family History  Problem Relation Age of Onset  . Hypertension Sister   . Hypertension Brother   . Hypertension Mother   . Coronary artery disease Mother     Social History History  Substance Use Topics  . Smoking status: Current Everyday Smoker -- 0.5 packs/day for 50 years    Types: Cigarettes  . Smokeless tobacco: Never Used  . Alcohol Use: No    No Known Allergies  Current Outpatient Prescriptions  Medication Sig Dispense Refill  . olmesartan-hydrochlorothiazide (BENICAR HCT) 40-12.5 MG per tablet Take 1 tablet by mouth daily.        Review of Systems  A comprehensive review of systems was negative except for: Constitutional: positive for fatigue Respiratory: positive for cough and dyspnea on exertion Musculoskeletal: positive for back pain  Physical Exam  AVW:UJWJX, healthy, no distress, well nourished and well developed SKIN: skin color, texture, turgor are normal HEAD: Normocephalic, No masses, lesions, tenderness or abnormalities EYES: normal EARS: External ears normal OROPHARYNX:no  exudate and no erythema  NECK: supple, no adenopathy LYMPH:  no palpable lymphadenopathy, no hepatosplenomegaly LUNGS: clear to auscultation  HEART: regular rate & rhythm, no  murmurs and no gallops ABDOMEN:abdomen soft, non-tender, normal bowel sounds and no masses or organomegaly BACK: Back symmetric, no curvature. EXTREMITIES:no joint deformities, effusion, or inflammation, no edema, no skin discoloration, no clubbing, no cyanosis  NEURO: alert & oriented x 3 with fluent speech, no focal motor/sensory deficits  PERFORMANCE STATUS: ECOG 1  LABORATORY DATA: Lab Results  Component Value Date   WBC 4.1 01/23/2012   HGB 16.5 01/23/2012   HCT 48.2 01/23/2012   MCV 97.1 01/23/2012   PLT 111* 01/23/2012      Chemistry      Component Value Date/Time   NA 140 01/18/2012 0711   K 4.1 01/18/2012 0711   CL 106 01/18/2012 0711   CO2 25 01/18/2012 0711   BUN 22 01/18/2012 0711   CREATININE 1.38* 01/18/2012 0711      Component Value Date/Time   CALCIUM 10.2 01/18/2012 0711   ALKPHOS 61 01/18/2012 0711   AST 23 01/18/2012 0711   ALT 20 01/18/2012 0711   BILITOT 0.3 01/18/2012 0711       RADIOGRAPHIC STUDIES: Dg Chest 2 View  01/18/2012  *RADIOLOGY REPORT*  Clinical Data: Patient for VATS.  Right chest mass.  CHEST - 2 VIEW  Comparison: CT chest 01/10/2012.  Findings: The lungs are emphysematous.  Right infrahilar mass seen on the comparison CT scan is not well demonstrated today.  The patient has new right middle lobe airspace disease.  Left lung is clear.  Heart size is normal.  IMPRESSION:  1.  New right middle lobe airspace disease is likely due to postobstructive phenomenon with pneumonia lymphangitic tumor spread less likely.  2.  Emphysema.  Original Report Authenticated By: Bernadene Bell. D'ALESSIO, M.D.    ASSESSMENT: This is a very pleasant 71 years old African American male recently diagnosed with extensive stage small cell lung cancer presenting with large infrahilar mass as well as the [facility] lymphadenopathy and bilateral pulmonary nodules.  PLAN: I have a lengthy discussion with the patient and his wife today about his disease stage, prognosis and treatment options. I  recommended for the patient the following: #1 complete the staging workup by ordering a PET scan as well as MRI of the brain to rule out brain metastasis. #2 I discussed with the patient treatment with systemic chemotherapy in the form of carboplatin for AUC of 5 on day 1 and etoposide 100 mg/M2 on days 1, 2 and 3 with Neulasta support on day 4. I discussed with the patient adverse effect of this treatment including but not limited to alopecia, myelosuppression, peripheral neuropathy, nausea and vomiting, liver or renal dysfunction. The patient would like to proceed with treatment as planned. He would have a chemotherapy education class this week. I expect the patient to start the first cycle of this treatment early next week. He would come back for followup visit in 2 weeks for evaluation and management any adverse effect of his treatment. He was advised to call me immediately if he has any concerning symptoms in the interval.  All questions were answered. The patient knows to call the clinic with any problems, questions or concerns. We can certainly see the patient much sooner if necessary.  Thank you so much for allowing me to participate in the care of [name]. I will continue to follow up the patient with you and assist  in his care.  I spent 30 minutes counseling the patient face to face. The total time spent in the appointment was 60 minutes.  Karthikeya Funke K. 01/23/2012, 3:16 PM

## 2012-01-23 NOTE — Progress Notes (Signed)
Patient came in today as a new patient with his wife and he has two insurances,the patient said that he should be [place] as far as financial assistance.

## 2012-01-23 NOTE — Telephone Encounter (Signed)
gv pt appt schedule for August/September including pet/mri for 8/16.

## 2012-01-24 ENCOUNTER — Other Ambulatory Visit: Payer: Medicare Other

## 2012-01-24 ENCOUNTER — Other Ambulatory Visit: Payer: Self-pay | Admitting: *Deleted

## 2012-01-24 ENCOUNTER — Encounter: Payer: Self-pay | Admitting: *Deleted

## 2012-01-24 DIAGNOSIS — R110 Nausea: Secondary | ICD-10-CM

## 2012-01-24 MED ORDER — PROCHLORPERAZINE MALEATE 10 MG PO TABS: 10.0000 mg | ORAL_TABLET | Freq: Four times a day (QID) | ORAL | Status: DC | PRN

## 2012-01-27 ENCOUNTER — Ambulatory Visit (HOSPITAL_COMMUNITY)
Admission: RE | Admit: 2012-01-27 | Discharge: 2012-01-27 | Disposition: A | Discharge status: Home / Self Care | Payer: Medicare Other | Source: Ambulatory Visit | Attending: Internal Medicine | Admitting: Internal Medicine

## 2012-01-27 ENCOUNTER — Encounter (HOSPITAL_COMMUNITY)
Admission: RE | Admit: 2012-01-27 | Discharge: 2012-01-27 | Disposition: A | Discharge status: Home / Self Care | Payer: Medicare Other | Source: Ambulatory Visit | Attending: Internal Medicine | Admitting: Internal Medicine

## 2012-01-27 DIAGNOSIS — Q283 Other malformations of cerebral vessels: Secondary | ICD-10-CM | POA: Insufficient documentation

## 2012-01-27 DIAGNOSIS — H0520 Unspecified exophthalmos: Secondary | ICD-10-CM | POA: Insufficient documentation

## 2012-01-27 DIAGNOSIS — R599 Enlarged lymph nodes, unspecified: Secondary | ICD-10-CM | POA: Insufficient documentation

## 2012-01-27 DIAGNOSIS — J984 Other disorders of lung: Secondary | ICD-10-CM | POA: Insufficient documentation

## 2012-01-27 DIAGNOSIS — C3490 Malignant neoplasm of unspecified part of unspecified bronchus or lung: Secondary | ICD-10-CM | POA: Insufficient documentation

## 2012-01-27 DIAGNOSIS — Z8673 Personal history of transient ischemic attack (TIA), and cerebral infarction without residual deficits: Secondary | ICD-10-CM | POA: Insufficient documentation

## 2012-01-27 DIAGNOSIS — R222 Localized swelling, mass and lump, trunk: Secondary | ICD-10-CM | POA: Insufficient documentation

## 2012-01-27 DIAGNOSIS — J438 Other emphysema: Secondary | ICD-10-CM | POA: Insufficient documentation

## 2012-01-27 MED ORDER — GADOBENATE DIMEGLUMINE 529 MG/ML IV SOLN: 20.0000 mL | Freq: Once | INTRAVENOUS | Status: AC | PRN

## 2012-01-27 MED ORDER — FLUDEOXYGLUCOSE F - 18 (FDG) INJECTION
17.9000 | Freq: Once | INTRAVENOUS | Status: AC | PRN
  Administered 2012-01-27: 17.9 via INTRAVENOUS

## 2012-01-30 ENCOUNTER — Telehealth: Payer: Self-pay | Admitting: Medical Oncology

## 2012-01-30 NOTE — Telephone Encounter (Signed)
Wife request result of MRI and pet scan.

## 2012-01-30 NOTE — Telephone Encounter (Signed)
Per Dr Donnald Garre I told pt wife that his Brain MRI was negative for Metastatic disease.  She asked about PET scan and I told her I did not have that information -she requested that t Dr Donnald Garre call her back with the result.

## 2012-01-31 ENCOUNTER — Ambulatory Visit (HOSPITAL_BASED_OUTPATIENT_CLINIC_OR_DEPARTMENT_OTHER): Payer: Medicare Other

## 2012-01-31 ENCOUNTER — Other Ambulatory Visit (HOSPITAL_BASED_OUTPATIENT_CLINIC_OR_DEPARTMENT_OTHER): Payer: Medicare Other

## 2012-01-31 ENCOUNTER — Other Ambulatory Visit: Payer: Self-pay | Admitting: Internal Medicine

## 2012-01-31 VITALS — BP 105/68 | HR 80 | Temp 97.7°F

## 2012-01-31 DIAGNOSIS — C3490 Malignant neoplasm of unspecified part of unspecified bronchus or lung: Secondary | ICD-10-CM

## 2012-01-31 DIAGNOSIS — Z5111 Encounter for antineoplastic chemotherapy: Secondary | ICD-10-CM

## 2012-01-31 DIAGNOSIS — C7A1 Malignant poorly differentiated neuroendocrine tumors: Secondary | ICD-10-CM

## 2012-01-31 LAB — COMPREHENSIVE METABOLIC PANEL
CO2: 25 mEq/L (ref 19–32)
Calcium: 10 mg/dL (ref 8.4–10.5)
Chloride: 107 mEq/L (ref 96–112)
Glucose, Bld: 114 mg/dL — ABNORMAL HIGH (ref 70–99)
Sodium: 139 mEq/L (ref 135–145)
Total Bilirubin: 0.6 mg/dL (ref 0.3–1.2)
Total Protein: 6.6 g/dL (ref 6.0–8.3)

## 2012-01-31 LAB — CBC WITH DIFFERENTIAL/PLATELET
Eosinophils Absolute: 0.2 10*3/uL (ref 0.0–0.5)
HCT: 46.8 % (ref 38.4–49.9)
LYMPH%: 32.1 % (ref 14.0–49.0)
MONO#: 0.3 10*3/uL (ref 0.1–0.9)
NEUT#: 2.6 10*3/uL (ref 1.5–6.5)
NEUT%: 57.7 % (ref 39.0–75.0)
Platelets: 117 10*3/uL — ABNORMAL LOW (ref 140–400)
RBC: 5.15 10*6/uL (ref 4.20–5.82)
WBC: 4.4 10*3/uL (ref 4.0–10.3)

## 2012-01-31 MED ORDER — ONDANSETRON 16 MG/50ML IVPB (CHCC)
16.0000 mg | Freq: Once | INTRAVENOUS | Status: AC
  Administered 2012-01-31: 16 mg via INTRAVENOUS

## 2012-01-31 MED ORDER — SODIUM CHLORIDE 0.9 % IV SOLN
505.5000 mg | Freq: Once | INTRAVENOUS | Status: AC
  Administered 2012-01-31: 510 mg via INTRAVENOUS
  Filled 2012-01-31: qty 51

## 2012-01-31 MED ORDER — SODIUM CHLORIDE 0.9 % IV SOLN
100.0000 mg/m2 | Freq: Once | INTRAVENOUS | Status: AC
  Administered 2012-01-31: 240 mg via INTRAVENOUS
  Filled 2012-01-31: qty 12

## 2012-01-31 MED ORDER — SODIUM CHLORIDE 0.9 % IV SOLN
Freq: Once | INTRAVENOUS | Status: AC
Start: 2012-01-31 — End: 2012-01-31
  Administered 2012-01-31: 14:00:00 via INTRAVENOUS

## 2012-01-31 MED ORDER — DEXAMETHASONE SODIUM PHOSPHATE 4 MG/ML IJ SOLN
20.0000 mg | Freq: Once | INTRAMUSCULAR | Status: AC
  Administered 2012-01-31: 20 mg via INTRAVENOUS

## 2012-01-31 NOTE — Patient Instructions (Addendum)
[facility] Discharge Instructions for Patients Receiving Chemotherapy  Today you received the following chemotherapy agents Carboplatin and Etoposide.  To help prevent nausea and vomiting after your treatment, we encourage you to take your nausea medication as ordered per MD.    If you develop nausea and vomiting that is not controlled by your nausea medication, call the clinic. If it is after clinic hours your family physician or the after hours number for the clinic or go to the Emergency Department.   BELOW ARE SYMPTOMS THAT SHOULD BE REPORTED IMMEDIATELY:  *FEVER GREATER THAN 100.5 F  *CHILLS WITH OR WITHOUT FEVER  NAUSEA AND VOMITING THAT IS NOT CONTROLLED WITH YOUR NAUSEA MEDICATION  *UNUSUAL SHORTNESS OF BREATH  *UNUSUAL BRUISING OR BLEEDING  TENDERNESS IN MOUTH AND THROAT WITH OR WITHOUT PRESENCE OF ULCERS  *URINARY PROBLEMS  *BOWEL PROBLEMS  UNUSUAL RASH Items with * indicate a potential emergency and should be followed up as soon as possible.  One of the nurses will contact you 24 hours after your treatment. Please let the nurse know about any problems that you may have experienced. Feel free to call the clinic you have any questions or concerns. The clinic phone number is [phone].   I have been informed and understand all the instructions given to me. I know to contact the clinic, my physician, or go to the Emergency Department if any problems should occur. I do not have any questions at this time, but understand that I may call the clinic during office hours   should I have any questions or need assistance in obtaining follow up care.    __________________________________________  _____________  __________ Signature of Patient or Authorized Representative            Date                   Time    __________________________________________ Nurse's Signature    

## 2012-02-01 ENCOUNTER — Ambulatory Visit (HOSPITAL_BASED_OUTPATIENT_CLINIC_OR_DEPARTMENT_OTHER): Payer: Medicare Other

## 2012-02-01 VITALS — BP 145/77 | HR 81 | Temp 98.4°F | Resp 20

## 2012-02-01 DIAGNOSIS — C3490 Malignant neoplasm of unspecified part of unspecified bronchus or lung: Secondary | ICD-10-CM

## 2012-02-01 DIAGNOSIS — C7A1 Malignant poorly differentiated neuroendocrine tumors: Secondary | ICD-10-CM

## 2012-02-01 DIAGNOSIS — Z5111 Encounter for antineoplastic chemotherapy: Secondary | ICD-10-CM

## 2012-02-01 MED ORDER — SODIUM CHLORIDE 0.9 % IV SOLN
Freq: Once | INTRAVENOUS | Status: AC
  Administered 2012-02-01: 16:00:00 via INTRAVENOUS

## 2012-02-01 MED ORDER — SODIUM CHLORIDE 0.9 % IV SOLN
100.0000 mg/m2 | Freq: Once | INTRAVENOUS | Status: AC
  Administered 2012-02-01: 240 mg via INTRAVENOUS
  Filled 2012-02-01: qty 12

## 2012-02-01 MED ORDER — PROCHLORPERAZINE MALEATE 10 MG PO TABS
10.0000 mg | ORAL_TABLET | Freq: Once | ORAL | Status: AC
  Administered 2012-02-01: 10 mg via ORAL

## 2012-02-01 NOTE — Patient Instructions (Addendum)
[facility] Discharge Instructions for Patients Receiving Chemotherapy  Today you received the following chemotherapy agents Etoposide.  To help prevent nausea and vomiting after your treatment, we encourage you to take your nausea medication as prescribed by Dr Arbutus Ped.  If you develop nausea and vomiting that is not controlled by your nausea medication, call the clinic. If it is after clinic hours your family physician or the after hours number for the clinic or go to the Emergency Department.   BELOW ARE SYMPTOMS THAT SHOULD BE REPORTED IMMEDIATELY:  *FEVER GREATER THAN 100.5 F  *CHILLS WITH OR WITHOUT FEVER  NAUSEA AND VOMITING THAT IS NOT CONTROLLED WITH YOUR NAUSEA MEDICATION  *UNUSUAL SHORTNESS OF BREATH  *UNUSUAL BRUISING OR BLEEDING  TENDERNESS IN MOUTH AND THROAT WITH OR WITHOUT PRESENCE OF ULCERS  *URINARY PROBLEMS  *BOWEL PROBLEMS  UNUSUAL RASH Items with * indicate a potential emergency and should be followed up as soon as possible.  Feel free to call the clinic you have any questions or concerns. The clinic phone number is [phone].

## 2012-02-02 ENCOUNTER — Ambulatory Visit (HOSPITAL_BASED_OUTPATIENT_CLINIC_OR_DEPARTMENT_OTHER): Payer: Medicare Other

## 2012-02-02 VITALS — BP 126/73 | HR 80 | Temp 98.0°F | Resp 20

## 2012-02-02 DIAGNOSIS — C7A1 Malignant poorly differentiated neuroendocrine tumors: Secondary | ICD-10-CM

## 2012-02-02 DIAGNOSIS — C3490 Malignant neoplasm of unspecified part of unspecified bronchus or lung: Secondary | ICD-10-CM

## 2012-02-02 DIAGNOSIS — Z5111 Encounter for antineoplastic chemotherapy: Secondary | ICD-10-CM

## 2012-02-02 MED ORDER — PROCHLORPERAZINE MALEATE 10 MG PO TABS
10.0000 mg | ORAL_TABLET | Freq: Once | ORAL | Status: AC
  Administered 2012-02-02: 10 mg via ORAL

## 2012-02-02 MED ORDER — SODIUM CHLORIDE 0.9 % IV SOLN
Freq: Once | INTRAVENOUS | Status: AC
  Administered 2012-02-02: 16:00:00 via INTRAVENOUS

## 2012-02-02 MED ORDER — SODIUM CHLORIDE 0.9 % IV SOLN
100.0000 mg/m2 | Freq: Once | INTRAVENOUS | Status: AC
  Administered 2012-02-02: 240 mg via INTRAVENOUS
  Filled 2012-02-02: qty 12

## 2012-02-02 NOTE — Patient Instructions (Signed)
[facility] Discharge Instructions for Patients Receiving Chemotherapy  Today you received the following chemotherapy agents Etoposide.  To help prevent nausea and vomiting after your treatment, we encourage you to take your nausea medication.   If you develop nausea and vomiting that is not controlled by your nausea medication, call the clinic. If it is after clinic hours your family physician or the after hours number for the clinic or go to the Emergency Department.   BELOW ARE SYMPTOMS THAT SHOULD BE REPORTED IMMEDIATELY:  *FEVER GREATER THAN 100.5 F  *CHILLS WITH OR WITHOUT FEVER  NAUSEA AND VOMITING THAT IS NOT CONTROLLED WITH YOUR NAUSEA MEDICATION  *UNUSUAL SHORTNESS OF BREATH  *UNUSUAL BRUISING OR BLEEDING  TENDERNESS IN MOUTH AND THROAT WITH OR WITHOUT PRESENCE OF ULCERS  *URINARY PROBLEMS  *BOWEL PROBLEMS  UNUSUAL RASH Items with * indicate a potential emergency and should be followed up as soon as possible.  One of the nurses will contact you 24 hours after your treatment. Please let the nurse know about any problems that you may have experienced. Feel free to call the clinic you have any questions or concerns. The clinic phone number is [phone].   I have been informed and understand all the instructions given to me. I know to contact the clinic, my physician, or go to the Emergency Department if any problems should occur. I do not have any questions at this time, but understand that I may call the clinic during office hours   should I have any questions or need assistance in obtaining follow up care.    __________________________________________  _____________  __________ Signature of Patient or Authorized Representative            Date                   Time    __________________________________________ Nurse's Signature    

## 2012-02-03 ENCOUNTER — Ambulatory Visit (HOSPITAL_BASED_OUTPATIENT_CLINIC_OR_DEPARTMENT_OTHER): Payer: Medicare Other

## 2012-02-03 ENCOUNTER — Telehealth: Payer: Self-pay | Admitting: *Deleted

## 2012-02-03 VITALS — BP 134/83 | HR 44 | Temp 98.3°F

## 2012-02-03 DIAGNOSIS — C3490 Malignant neoplasm of unspecified part of unspecified bronchus or lung: Secondary | ICD-10-CM

## 2012-02-03 DIAGNOSIS — C7A1 Malignant poorly differentiated neuroendocrine tumors: Secondary | ICD-10-CM

## 2012-02-03 MED ORDER — PEGFILGRASTIM INJECTION 6 MG/0.6ML
6.0000 mg | Freq: Once | SUBCUTANEOUS | Status: AC
  Administered 2012-02-03: 6 mg via SUBCUTANEOUS
  Filled 2012-02-03: qty 0.6

## 2012-02-03 NOTE — Telephone Encounter (Signed)
Patient here for Neulasta injection following 1st [facility] chemo treatment.  States doing well   No nausea, vomiting, or diarrhea.  Drinking lots of fluids and eating well.  Pt knows to call if having any problems, questions, or concerns.   Patient repeated information via teach back.

## 2012-02-07 ENCOUNTER — Telehealth: Payer: Self-pay | Admitting: Internal Medicine

## 2012-02-07 ENCOUNTER — Ambulatory Visit (HOSPITAL_BASED_OUTPATIENT_CLINIC_OR_DEPARTMENT_OTHER): Payer: Medicare Other | Admitting: Physician Assistant

## 2012-02-07 ENCOUNTER — Other Ambulatory Visit (HOSPITAL_BASED_OUTPATIENT_CLINIC_OR_DEPARTMENT_OTHER): Payer: Medicare Other | Admitting: Lab

## 2012-02-07 VITALS — BP 109/66 | HR 87 | Temp 97.2°F | Resp 20 | Ht 73.0 in | Wt 247.9 lb

## 2012-02-07 DIAGNOSIS — C3410 Malignant neoplasm of upper lobe, unspecified bronchus or lung: Secondary | ICD-10-CM

## 2012-02-07 DIAGNOSIS — C3490 Malignant neoplasm of unspecified part of unspecified bronchus or lung: Secondary | ICD-10-CM

## 2012-02-07 LAB — CBC WITH DIFFERENTIAL/PLATELET
BASO%: 0.4 % (ref 0.0–2.0)
Basophils Absolute: 0 10*3/uL (ref 0.0–0.1)
EOS%: 2 % (ref 0.0–7.0)
Eosinophils Absolute: 0.1 10*3/uL (ref 0.0–0.5)
HCT: 41.9 % (ref 38.4–49.9)
HGB: 14.7 g/dL (ref 13.0–17.1)
LYMPH%: 28.1 % (ref 14.0–49.0)
MCH: 32.8 pg (ref 27.2–33.4)
MCHC: 35.1 g/dL (ref 32.0–36.0)
MCV: 93.5 fL (ref 79.3–98.0)
MONO#: 0 10*3/uL — ABNORMAL LOW (ref 0.1–0.9)
MONO%: 0.4 % (ref 0.0–14.0)
NEUT#: 3.1 10*3/uL (ref 1.5–6.5)
NEUT%: 69.1 % (ref 39.0–75.0)
Platelets: 85 10*3/uL — ABNORMAL LOW (ref 140–400)
RBC: 4.48 10*6/uL (ref 4.20–5.82)
RDW: 12.6 % (ref 11.0–14.6)
WBC: 4.5 10*3/uL (ref 4.0–10.3)
lymph#: 1.3 10*3/uL (ref 0.9–3.3)
nRBC: 0 % (ref 0–0)

## 2012-02-07 LAB — COMPREHENSIVE METABOLIC PANEL (CC13)
ALT: 15 U/L (ref 0–55)
Albumin: 3.5 g/dL (ref 3.5–5.0)
CO2: 29 mEq/L (ref 22–29)
Calcium: 10.2 mg/dL (ref 8.4–10.4)
Chloride: 102 mEq/L (ref 98–107)
Potassium: 4.2 mEq/L (ref 3.5–5.1)
Sodium: 139 mEq/L (ref 136–145)
Total Protein: 6.3 g/dL — ABNORMAL LOW (ref 6.4–8.3)

## 2012-02-07 MED ORDER — HYDROCODONE-ACETAMINOPHEN 5-500 MG PO TABS: ORAL_TABLET | ORAL | Status: DC

## 2012-02-07 NOTE — Telephone Encounter (Signed)
appts made and printed for pt aom °

## 2012-02-07 NOTE — Patient Instructions (Addendum)
Continue weekly labs as scheduled Follow up prior to cycle #2 of your chemotherapy on 02/21/12

## 2012-02-14 ENCOUNTER — Other Ambulatory Visit (HOSPITAL_BASED_OUTPATIENT_CLINIC_OR_DEPARTMENT_OTHER): Payer: Medicare Other

## 2012-02-14 DIAGNOSIS — C3490 Malignant neoplasm of unspecified part of unspecified bronchus or lung: Secondary | ICD-10-CM

## 2012-02-14 LAB — CBC WITH DIFFERENTIAL/PLATELET
BASO%: 0.3 % (ref 0.0–2.0)
LYMPH%: 18.8 % (ref 14.0–49.0)
MCHC: 35.3 g/dL (ref 32.0–36.0)
MONO#: 0.8 10*3/uL (ref 0.1–0.9)
Platelets: 62 10*3/uL — ABNORMAL LOW (ref 140–400)
RBC: 4.64 10*6/uL (ref 4.20–5.82)
RDW: 12.9 % (ref 11.0–14.6)
WBC: 11 10*3/uL — ABNORMAL HIGH (ref 4.0–10.3)
lymph#: 2.1 10*3/uL (ref 0.9–3.3)
nRBC: 0 % (ref 0–0)

## 2012-02-14 LAB — COMPREHENSIVE METABOLIC PANEL (CC13)
ALT: 20 U/L (ref 0–55)
AST: 19 U/L (ref 5–34)
Calcium: 10.2 mg/dL (ref 8.4–10.4)
Chloride: 106 mEq/L (ref 98–107)
Creatinine: 1.5 mg/dL — ABNORMAL HIGH (ref 0.7–1.3)

## 2012-02-15 NOTE — Progress Notes (Signed)
No images are attached to the encounter. No scans are attached to the encounter. No scans are attached to the encounter. [facility] OFFICE PROGRESS NOTE  [name], MD [phone] [place] [address]  DIAGNOSIS: Extensive stage small cell lung cancer  PRIOR THERAPY: None  CURRENT THERAPY: Systemic chemotherapy in the form of carboplatin for an AUC of 5 given on day 1 and etoposide 100 mg per meter squared given on days 1, 2 and 3 with Neulasta support given on day 4. Status post 1 cycle.  INTERVAL HISTORY: [name] 71 y.o. male returns for a scheduled regular symptom management visit for followup of his extensive stage small cell lung cancer. Overall he tolerated his first cycle of chemotherapy relatively well. He reports having "little trouble getting up and around" on Saturday but by Sunday he was fine and able to go to church. He denied having any significant pain, denied fever, chills, nausea, vomiting, diarrhea or constipation. He currently takes 3 stool softeners daily and has started that habit prior to beginning chemotherapy. He does have some occasional back pain that is more right-sided than left relieved with a half a pain pill. Further investigation reveals he is actually taking BC powders and Advil.   MEDICAL HISTORY: Past Medical History  Diagnosis Date  . Hypertension   . ALLERGIC RHINITIS   . Malignant neoplasm of bronchus and lung, unspecified site 01/23/2012    ALLERGIES:   has no known allergies.  MEDICATIONS:  Current Outpatient Prescriptions  Medication Sig Dispense Refill  . HYDROcodone-acetaminophen (VICODIN) 5-500 MG per tablet 1/2 to 1 tablet by mouth every 6 hours as needed for pain  30 tablet  0  . olmesartan-hydrochlorothiazide (BENICAR HCT) 40-12.5 MG per tablet Take 1 tablet by mouth daily.      . prochlorperazine (COMPAZINE) 10 MG tablet Take 1 tablet (10 mg total) by mouth every 6 (six) hours as needed.  30 tablet  0     SURGICAL HISTORY:  Past Surgical History  Procedure Date  . Wrist surgery 1961    right wrist    REVIEW OF SYSTEMS:  Pertinent items are noted in HPI.   PHYSICAL EXAMINATION: General appearance: alert, cooperative, appears stated age and no distress Head: Normocephalic, without obvious abnormality, atraumatic Neck: no adenopathy, no carotid bruit, no JVD, supple, symmetrical, trachea midline and thyroid not enlarged, symmetric, no tenderness/mass/nodules Lymph nodes: Cervical, supraclavicular, and axillary nodes normal. Resp: clear to auscultation bilaterally Back: symmetric, no curvature. ROM normal. No CVA tenderness. Cardio: regular rate and rhythm, S1, S2 normal, no murmur, click, rub or gallop GI: soft, non-tender; bowel sounds normal; no masses,  no organomegaly Extremities: extremities normal, atraumatic, no cyanosis or edema Neurologic: Alert and oriented X 3, normal strength and tone. Normal symmetric reflexes. Normal coordination and gait  ECOG PERFORMANCE STATUS: 0 - Asymptomatic  Blood pressure 109/66, pulse 87, temperature 97.2 F (36.2 C), temperature source Oral, resp. rate 20, height 6\' 1"  (1.854 m), weight 247 lb 14.4 oz (112.447 kg).  LABORATORY DATA: Lab Results  Component Value Date   WBC 11.0* 02/14/2012   HGB 15.3 02/14/2012   HCT 43.3 02/14/2012   MCV 93.3 02/14/2012   PLT 62* 02/14/2012      Chemistry      Component Value Date/Time   NA 144 02/14/2012 1353   NA 139 01/31/2012 1308   K 4.1 02/14/2012 1353   K 4.0 01/31/2012 1308   CL 106 02/14/2012 1353  CL 107 01/31/2012 1308   CO2 26 02/14/2012 1353   CO2 25 01/31/2012 1308   BUN 23.0 02/14/2012 1353   BUN 17 01/31/2012 1308   CREATININE 1.5* 02/14/2012 1353   CREATININE 1.34 01/31/2012 1308      Component Value Date/Time   CALCIUM 10.2 02/14/2012 1353   CALCIUM 10.0 01/31/2012 1308   ALKPHOS 104 02/14/2012 1353   ALKPHOS 55 01/31/2012 1308   AST 19 02/14/2012 1353   AST 20 01/31/2012 1308   ALT 20 02/14/2012 1353    ALT 17 01/31/2012 1308   BILITOT 0.30 02/14/2012 1353   BILITOT 0.6 01/31/2012 1308       RADIOGRAPHIC STUDIES:  Dg Chest 2 View  01/18/2012  *RADIOLOGY REPORT*  Clinical Data: Patient for VATS.  Right chest mass.  CHEST - 2 VIEW  Comparison: CT chest 01/10/2012.  Findings: The lungs are emphysematous.  Right infrahilar mass seen on the comparison CT scan is not well demonstrated today.  The patient has new right middle lobe airspace disease.  Left lung is clear.  Heart size is normal.  IMPRESSION:  1.  New right middle lobe airspace disease is likely due to postobstructive phenomenon with pneumonia lymphangitic tumor spread less likely.  2.  Emphysema.  Original Report Authenticated By: Bernadene Bell. Maricela Curet, M.D.   Mr Laqueta Jean Wo Contrast  01/27/2012  *RADIOLOGY REPORT*  Clinical Data: Staging new diagnosis of lung cancer.  MRI HEAD WITHOUT AND WITH CONTRAST  Technique:  Multiplanar, multiecho pulse sequences of the brain and surrounding structures were obtained according to standard protocol without and with intravenous contrast  Contrast:  20 ml MultiHance.  Comparison: None.  Findings: No acute infarct.  No intracranial hemorrhage.  Mild global atrophy without hydrocephalus.  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  No other intracranial parenchymal enhancing lesion to suggest the presence of metastatic disease.  Remote tiny left thalamic infarct.  Mild small vessel disease type changes.  Major intracranial vascular structures are patent.  Exophthalmos.  Minimal to mild paranasal sinus mucosal thickening most notable inferior aspect of the left maxillary sinus and posterior left ethmoid sinus air cell.  Transverse ligament hypertrophy.  Mild spinal stenosis upper cervical spine.  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  No other intracranial parenchymal enhancing lesion to suggest the presence of metastatic disease.  Remote tiny left thalamic infarct.   Mild small vessel disease type changes.  Please see above.  Original Report Authenticated By: Fuller Canada, M.D.   Nm Pet Image Initial (pi) Skull Base To Thigh  01/27/2012  *RADIOLOGY REPORT*  Clinical Data: Initial treatment strategy for lung cancer.  NUCLEAR MEDICINE PET SKULL BASE TO THIGH  Fasting Blood Glucose:  131  Technique:  17.9 mCi F-18 FDG was injected intravenously. CT data was obtained and used for attenuation correction and anatomic localization only.  (This was not acquired as a diagnostic CT examination.) Additional exam technical data entered on technologist worksheet.  Comparison:  01/10/2012; 12/08/2011  Findings:  Neck: No hypermetabolic lymph nodes in the neck.  Chest:  Right lower lobe mass standard uptake value is 8.6. Adjacent peripheral airspace opacity in the right lower lobe has a maximum standard uptake value of 10.0.  Right lower paratracheal lymph node maximum standard uptake value is 6.5.  Subcarinal lymph node maximum standard uptake value is 6.5.  A pleural-based density along the margin the right middle lobe has a maximum standard uptake value of 5.0. Several hypermetabolic foci are  observed along the right anterior and lateral hemidiaphragm, one with a maximum standard uptake value of 7.7 and another with a maximum standard uptake value also 7.7, concerning for malignancy along the hemidiaphragmatic margin.  Several low density lesions to the right of the ascending aorta, and adjacent to the right atrium along the pericardium do not have associated hypermetabolic activity.  Paraseptal emphysema noted.  An 8 mm nodule along the left hemidiaphragm does not have definitive associated hypermetabolic activity.  Abdomen/Pelvis:  Faint adrenal activity is not significantly above background at this time.  Bilateral renal cysts noted.  Paraspinal muscular activity is thought to be physiologic.  Overall no hypermetabolic activity is observed in the abdomen or pelvis to favor  intra-abdominal metastatic disease.  [place]:  No focal hypermetabolic activity to suggest skeletal metastasis.  IMPRESSION:  1.  Hypermetabolic right lower lobe mass, with adjacent peripheral hypermetabolic activity which may represent lymphangitic spread of tumor.  There also hypermetabolic foci along the right pleural surface/hemidiaphragm, most compatible with pleural metastatic disease.  No pleural effusion is observed.  Hypermetabolic right paratracheal and subcarinal adenopathy noted. 2.  Low density structures adjacent to the right atrium and ascending aorta, probably represent small pericardial cysts given the lack of hypermetabolic activity, but merit observation. 3.  No findings of metastatic disease to the neck, abdomen, or pelvis. 4.  The 8 mm nodule at the left lung base is not definitively hypermetabolic.  Original Report Authenticated By: Dellia Cloud, M.D.     ASSESSMENT/PLAN: Patient is a very pleasant 71 year old African American male recently diagnosed with extensive stage small cell lung cancer. He status post 1 cycle of systemic chemotherapy with carboplatin for an AUC of 5 given on day 1 and etoposide at 100 mg router squared given on days 1, 2 and 3 with Neulasta support given on day 4. Patient was discussed with Dr. Arbutus Ped. He'll continue with weekly labs consisting of a CBC differential and C. met. He will return in 2 weeks prior to cycle #2 also with a repeat CBC differential and C. met. We've asked him to discontinue taking BC powders and Advil for pain and he was given a prescription for Vicodin 5 500, one half to one whole tablet by mouth every 6 hours as needed for pain a total of 30 tablets with no refill.    Laural Benes, Andreanna Mikolajczak E, PA-C     All questions were answered. The patient knows to call the clinic with any problems, questions or concerns. We can certainly see the patient much sooner if necessary.  I spent 20 minutes counseling the patient face to face. The  total time spent in the appointment was 30 minutes.

## 2012-02-21 ENCOUNTER — Other Ambulatory Visit (HOSPITAL_BASED_OUTPATIENT_CLINIC_OR_DEPARTMENT_OTHER): Payer: Medicare Other | Admitting: Lab

## 2012-02-21 ENCOUNTER — Ambulatory Visit (HOSPITAL_BASED_OUTPATIENT_CLINIC_OR_DEPARTMENT_OTHER): Payer: Medicare Other | Admitting: Lab

## 2012-02-21 ENCOUNTER — Ambulatory Visit (HOSPITAL_BASED_OUTPATIENT_CLINIC_OR_DEPARTMENT_OTHER): Payer: Medicare Other | Admitting: Physician Assistant

## 2012-02-21 ENCOUNTER — Telehealth: Payer: Self-pay | Admitting: Internal Medicine

## 2012-02-21 ENCOUNTER — Ambulatory Visit (HOSPITAL_BASED_OUTPATIENT_CLINIC_OR_DEPARTMENT_OTHER): Payer: Medicare Other

## 2012-02-21 VITALS — BP 111/72 | HR 52 | Temp 97.7°F | Resp 18 | Ht 73.0 in | Wt 249.9 lb

## 2012-02-21 DIAGNOSIS — R350 Frequency of micturition: Secondary | ICD-10-CM

## 2012-02-21 DIAGNOSIS — C3490 Malignant neoplasm of unspecified part of unspecified bronchus or lung: Secondary | ICD-10-CM

## 2012-02-21 DIAGNOSIS — C3410 Malignant neoplasm of upper lobe, unspecified bronchus or lung: Secondary | ICD-10-CM

## 2012-02-21 DIAGNOSIS — Z5111 Encounter for antineoplastic chemotherapy: Secondary | ICD-10-CM

## 2012-02-21 LAB — COMPREHENSIVE METABOLIC PANEL (CC13)
Albumin: 3.6 g/dL (ref 3.5–5.0)
BUN: 23 mg/dL (ref 7.0–26.0)
Calcium: 10 mg/dL (ref 8.4–10.4)
Chloride: 108 mEq/L — ABNORMAL HIGH (ref 98–107)
Glucose: 93 mg/dl (ref 70–99)
Potassium: 4.2 mEq/L (ref 3.5–5.1)

## 2012-02-21 LAB — CBC WITH DIFFERENTIAL/PLATELET
Basophils Absolute: 0 10*3/uL (ref 0.0–0.1)
EOS%: 0.2 % (ref 0.0–7.0)
HCT: 41.5 % (ref 38.4–49.9)
HGB: 14.9 g/dL (ref 13.0–17.1)
MCH: 33 pg (ref 27.2–33.4)
NEUT%: 71.6 % (ref 39.0–75.0)
lymph#: 1.7 10*3/uL (ref 0.9–3.3)

## 2012-02-21 LAB — URINALYSIS, MICROSCOPIC - CHCC
Bilirubin (Urine): NEGATIVE
Glucose: NEGATIVE g/dL
Ketones: NEGATIVE mg/dL

## 2012-02-21 MED ORDER — SODIUM CHLORIDE 0.9 % IV SOLN
Freq: Once | INTRAVENOUS | Status: AC
  Administered 2012-02-21: 14:00:00 via INTRAVENOUS

## 2012-02-21 MED ORDER — DEXAMETHASONE SODIUM PHOSPHATE 4 MG/ML IJ SOLN
20.0000 mg | Freq: Once | INTRAMUSCULAR | Status: AC
  Administered 2012-02-21: 20 mg via INTRAVENOUS

## 2012-02-21 MED ORDER — SODIUM CHLORIDE 0.9 % IV SOLN
493.0000 mg | Freq: Once | INTRAVENOUS | Status: AC
  Administered 2012-02-21: 490 mg via INTRAVENOUS
  Filled 2012-02-21: qty 49

## 2012-02-21 MED ORDER — SODIUM CHLORIDE 0.9 % IV SOLN
100.0000 mg/m2 | Freq: Once | INTRAVENOUS | Status: AC
  Administered 2012-02-21: 240 mg via INTRAVENOUS
  Filled 2012-02-21: qty 12

## 2012-02-21 MED ORDER — ONDANSETRON 16 MG/50ML IVPB (CHCC)
16.0000 mg | Freq: Once | INTRAVENOUS | Status: AC
  Administered 2012-02-21: 16 mg via INTRAVENOUS

## 2012-02-21 NOTE — Patient Instructions (Addendum)
Go back to the lab today to give a urine specimen to be evaluated for a possible urinary tract infection Follow up with Dr. Arbutus Ped in 3 weeks with a restaging CT scan of your chest abdomen and pelvis to re-evaluate your disease.

## 2012-02-21 NOTE — Patient Instructions (Addendum)
[facility] Discharge Instructions for Patients Receiving Chemotherapy  Today you received the following chemotherapy agents: carboplatin, etoposide  To help prevent nausea and vomiting after your treatment, we encourage you to take your nausea medication.  Take it as often as prescribed.     If you develop nausea and vomiting that is not controlled by your nausea medication, call the clinic. If it is after clinic hours your family physician or the after hours number for the clinic or go to the Emergency Department.   BELOW ARE SYMPTOMS THAT SHOULD BE REPORTED IMMEDIATELY:  *FEVER GREATER THAN 100.5 F  *CHILLS WITH OR WITHOUT FEVER  NAUSEA AND VOMITING THAT IS NOT CONTROLLED WITH YOUR NAUSEA MEDICATION  *UNUSUAL SHORTNESS OF BREATH  *UNUSUAL BRUISING OR BLEEDING  TENDERNESS IN MOUTH AND THROAT WITH OR WITHOUT PRESENCE OF ULCERS  *URINARY PROBLEMS  *BOWEL PROBLEMS  UNUSUAL RASH Items with * indicate a potential emergency and should be followed up as soon as possible.  Feel free to call the clinic you have any questions or concerns. The clinic phone number is [phone].   I have been informed and understand all the instructions given to me. I know to contact the clinic, my physician, or go to the Emergency Department if any problems should occur. I do not have any questions at this time, but understand that I may call the clinic during office hours   should I have any questions or need assistance in obtaining follow up care.    __________________________________________  _____________  __________ Signature of Patient or Authorized Representative            Date                   Time    __________________________________________ Nurse's Signature    

## 2012-02-21 NOTE — Progress Notes (Signed)
Pt discharged to home with no complaints.  

## 2012-02-21 NOTE — Telephone Encounter (Signed)
gv pt appt schedule for September/October including ct scan.

## 2012-02-22 ENCOUNTER — Ambulatory Visit (HOSPITAL_BASED_OUTPATIENT_CLINIC_OR_DEPARTMENT_OTHER): Payer: Medicare Other

## 2012-02-22 VITALS — BP 115/74 | HR 62 | Temp 97.2°F | Resp 16

## 2012-02-22 DIAGNOSIS — Z5111 Encounter for antineoplastic chemotherapy: Secondary | ICD-10-CM

## 2012-02-22 DIAGNOSIS — C3490 Malignant neoplasm of unspecified part of unspecified bronchus or lung: Secondary | ICD-10-CM

## 2012-02-22 DIAGNOSIS — C7A1 Malignant poorly differentiated neuroendocrine tumors: Secondary | ICD-10-CM

## 2012-02-22 MED ORDER — PROCHLORPERAZINE MALEATE 10 MG PO TABS
10.0000 mg | ORAL_TABLET | Freq: Once | ORAL | Status: AC
  Administered 2012-02-22: 10 mg via ORAL

## 2012-02-22 MED ORDER — SODIUM CHLORIDE 0.9 % IV SOLN
100.0000 mg/m2 | Freq: Once | INTRAVENOUS | Status: AC
  Administered 2012-02-22: 240 mg via INTRAVENOUS
  Filled 2012-02-22: qty 12

## 2012-02-22 MED ORDER — SODIUM CHLORIDE 0.9 % IV SOLN
Freq: Once | INTRAVENOUS | Status: AC
  Administered 2012-02-22: 15:00:00 via INTRAVENOUS

## 2012-02-22 NOTE — Progress Notes (Signed)
No images are attached to the encounter. No scans are attached to the encounter. No scans are attached to the encounter. [facility] OFFICE PROGRESS NOTE  [name], MD [phone] [place] [address]  DIAGNOSIS: Extensive stage small cell lung cancer  PRIOR THERAPY: None  CURRENT THERAPY: Systemic chemotherapy in the form of carboplatin for an AUC of 5 given on day 1 and etoposide 100 mg per meter squared given on days 1, 2 and 3 with Neulasta support given on day 4. Status post 1 cycle.  INTERVAL HISTORY: [name] 71 y.o. male returns for a scheduled regular symptom management visit for followup of his extensive stage small cell lung cancer. Overall he tolerated his first cycle of chemotherapy relatively well. He complains of mid back pain that is well-controlled with only a half tablet of Vicodin daily. He has noticed some numbness and tingling from the knee to the hip area on both lower extremities. He states that the sensation tends to be worse if these up on his feet for a long time. He has not suffered any near falls or falls. He has noticed some change in his urine flow of the past couple of days. He states that it was somewhat slow yesterday with some urinary frequency at night. He reports having a urinary tract infection about 3 months ago. He presents to proceed with cycle #2 of his systemic chemotherapy with carboplatin and etoposide with Neulasta support.  He denied having any significant pain, denied fever, chills, nausea, vomiting, diarrhea or constipation.   MEDICAL HISTORY: Past Medical History  Diagnosis Date  . Hypertension   . ALLERGIC RHINITIS   . Malignant neoplasm of bronchus and lung, unspecified site 01/23/2012    ALLERGIES:   has no known allergies.  MEDICATIONS:  Current Outpatient Prescriptions  Medication Sig Dispense Refill  . HYDROcodone-acetaminophen (VICODIN) 5-500 MG per tablet 1/2 to 1 tablet by mouth every 6 hours as  needed for pain  30 tablet  0  . olmesartan-hydrochlorothiazide (BENICAR HCT) 40-12.5 MG per tablet Take 1 tablet by mouth daily.      . prochlorperazine (COMPAZINE) 10 MG tablet Take 1 tablet (10 mg total) by mouth every 6 (six) hours as needed.  30 tablet  0   No current facility-administered medications for this visit.   Facility-Administered Medications Ordered in Other Visits  Medication Dose Route Frequency Provider Last Rate Last Dose  . 0.9 %  sodium chloride infusion   Intravenous Once Conni Slipper, PA      . CARBOplatin (PARAPLATIN) 490 mg in sodium chloride 0.9 % 250 mL chemo infusion  490 mg Intravenous Once Conni Slipper, PA   490 mg at 02/21/12 1433  . dexamethasone (DECADRON) injection 20 mg  20 mg Intravenous Once Conni Slipper, PA   20 mg at 02/21/12 1354  . etoposide (VEPESID) 240 mg in sodium chloride 0.9 % 600 mL chemo infusion  100 mg/m2 (Treatment Plan Actual) Intravenous Once Conni Slipper, PA   240 mg at 02/21/12 1510  . ondansetron (ZOFRAN) IVPB 16 mg  16 mg Intravenous Once Conni Slipper, PA   16 mg at 02/21/12 1354    SURGICAL HISTORY:  Past Surgical History  Procedure Date  . Wrist surgery 1961    right wrist    REVIEW OF SYSTEMS:  Pertinent items are noted in HPI.   PHYSICAL EXAMINATION: General appearance: alert, cooperative, appears stated age and no distress Head: Normocephalic,  without obvious abnormality, atraumatic Neck: no adenopathy, no carotid bruit, no JVD, supple, symmetrical, trachea midline and thyroid not enlarged, symmetric, no tenderness/mass/nodules Lymph nodes: Cervical, supraclavicular, and axillary nodes normal. Resp: clear to auscultation bilaterally Back: symmetric, no curvature. ROM normal. No CVA tenderness. Cardio: regular rate and rhythm, S1, S2 normal, no murmur, click, rub or gallop GI: soft, non-tender; bowel sounds normal; no masses,  no organomegaly Extremities: extremities normal, atraumatic, no cyanosis or  edema Neurologic: Alert and oriented X 3, normal strength and tone. Normal symmetric reflexes. Normal coordination and gait  ECOG PERFORMANCE STATUS: 0 - Asymptomatic  Blood pressure 111/72, pulse 52, temperature 97.7 F (36.5 C), temperature source Oral, resp. rate 18, height 6\' 1"  (1.854 m), weight 249 lb 14.4 oz (113.354 kg).  LABORATORY DATA: Lab Results  Component Value Date   WBC 8.1 02/21/2012   HGB 14.9 02/21/2012   HCT 41.5 02/21/2012   MCV 91.8 02/21/2012   PLT 176 02/21/2012      Chemistry      Component Value Date/Time   NA 138 02/21/2012 1136   NA 139 01/31/2012 1308   K 4.2 02/21/2012 1136   K 4.0 01/31/2012 1308   CL 108* 02/21/2012 1136   CL 107 01/31/2012 1308   CO2 22 02/21/2012 1136   CO2 25 01/31/2012 1308   BUN 23.0 02/21/2012 1136   BUN 17 01/31/2012 1308   CREATININE 1.2 02/21/2012 1136   CREATININE 1.34 01/31/2012 1308      Component Value Date/Time   CALCIUM 10.0 02/21/2012 1136   CALCIUM 10.0 01/31/2012 1308   ALKPHOS 80 02/21/2012 1136   ALKPHOS 55 01/31/2012 1308   AST 17 02/21/2012 1136   AST 20 01/31/2012 1308   ALT 18 02/21/2012 1136   ALT 17 01/31/2012 1308   BILITOT 0.30 02/21/2012 1136   BILITOT 0.6 01/31/2012 1308       RADIOGRAPHIC STUDIES:  Dg Chest 2 View  01/18/2012  *RADIOLOGY REPORT*  Clinical Data: Patient for VATS.  Right chest mass.  CHEST - 2 VIEW  Comparison: CT chest 01/10/2012.  Findings: The lungs are emphysematous.  Right infrahilar mass seen on the comparison CT scan is not well demonstrated today.  The patient has new right middle lobe airspace disease.  Left lung is clear.  Heart size is normal.  IMPRESSION:  1.  New right middle lobe airspace disease is likely due to postobstructive phenomenon with pneumonia lymphangitic tumor spread less likely.  2.  Emphysema.  Original Report Authenticated By: Bernadene Bell. Maricela Curet, M.D.   Mr Laqueta Jean Wo Contrast  01/27/2012  *RADIOLOGY REPORT*  Clinical Data: Staging new diagnosis of lung cancer.  MRI  HEAD WITHOUT AND WITH CONTRAST  Technique:  Multiplanar, multiecho pulse sequences of the brain and surrounding structures were obtained according to standard protocol without and with intravenous contrast  Contrast:  20 ml MultiHance.  Comparison: None.  Findings: No acute infarct.  No intracranial hemorrhage.  Mild global atrophy without hydrocephalus.  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  No other intracranial parenchymal enhancing lesion to suggest the presence of metastatic disease.  Remote tiny left thalamic infarct.  Mild small vessel disease type changes.  Major intracranial vascular structures are patent.  Exophthalmos.  Minimal to mild paranasal sinus mucosal thickening most notable inferior aspect of the left maxillary sinus and posterior left ethmoid sinus air cell.  Transverse ligament hypertrophy.  Mild spinal stenosis upper cervical spine.  IMPRESSION: Developmental venous anomaly left frontal opercular  region.  No other intracranial parenchymal enhancing lesion to suggest the presence of metastatic disease.  Remote tiny left thalamic infarct.  Mild small vessel disease type changes.  Please see above.  Original Report Authenticated By: Fuller Canada, M.D.   Nm Pet Image Initial (pi) Skull Base To Thigh  01/27/2012  *RADIOLOGY REPORT*  Clinical Data: Initial treatment strategy for lung cancer.  NUCLEAR MEDICINE PET SKULL BASE TO THIGH  Fasting Blood Glucose:  131  Technique:  17.9 mCi F-18 FDG was injected intravenously. CT data was obtained and used for attenuation correction and anatomic localization only.  (This was not acquired as a diagnostic CT examination.) Additional exam technical data entered on technologist worksheet.  Comparison:  01/10/2012; 12/08/2011  Findings:  Neck: No hypermetabolic lymph nodes in the neck.  Chest:  Right lower lobe mass standard uptake value is 8.6. Adjacent peripheral airspace opacity in the right lower lobe has a maximum standard  uptake value of 10.0.  Right lower paratracheal lymph node maximum standard uptake value is 6.5.  Subcarinal lymph node maximum standard uptake value is 6.5.  A pleural-based density along the margin the right middle lobe has a maximum standard uptake value of 5.0. Several hypermetabolic foci are observed along the right anterior and lateral hemidiaphragm, one with a maximum standard uptake value of 7.7 and another with a maximum standard uptake value also 7.7, concerning for malignancy along the hemidiaphragmatic margin.  Several low density lesions to the right of the ascending aorta, and adjacent to the right atrium along the pericardium do not have associated hypermetabolic activity.  Paraseptal emphysema noted.  An 8 mm nodule along the left hemidiaphragm does not have definitive associated hypermetabolic activity.  Abdomen/Pelvis:  Faint adrenal activity is not significantly above background at this time.  Bilateral renal cysts noted.  Paraspinal muscular activity is thought to be physiologic.  Overall no hypermetabolic activity is observed in the abdomen or pelvis to favor intra-abdominal metastatic disease.  [place]:  No focal hypermetabolic activity to suggest skeletal metastasis.  IMPRESSION:  1.  Hypermetabolic right lower lobe mass, with adjacent peripheral hypermetabolic activity which may represent lymphangitic spread of tumor.  There also hypermetabolic foci along the right pleural surface/hemidiaphragm, most compatible with pleural metastatic disease.  No pleural effusion is observed.  Hypermetabolic right paratracheal and subcarinal adenopathy noted. 2.  Low density structures adjacent to the right atrium and ascending aorta, probably represent small pericardial cysts given the lack of hypermetabolic activity, but merit observation. 3.  No findings of metastatic disease to the neck, abdomen, or pelvis. 4.  The 8 mm nodule at the left lung base is not definitively hypermetabolic.  Original Report  Authenticated By: Dellia Cloud, M.D.     ASSESSMENT/PLAN: Patient is a very pleasant 71 year old African American male recently diagnosed with extensive stage small cell lung cancer. He status post 1 cycle of systemic chemotherapy with carboplatin for an AUC of 5 given on day 1 and etoposide at 100 mg router squared given on days 1, 2 and 3 with Neulasta support given on day 4. Patient was discussed with Dr. Arbutus Ped. He'll continue with weekly labs consisting of a CBC differential and C. met. He'll proceed with cycle #2 of his systemic chemotherapy with carboplatin and etoposide with Neulasta support. He'll followup with Dr. Arbutus Ped in 3 weeks with repeat CBC differential, C. met and CT of the chest abdomen and pelvis with contrast to reevaluate his disease. To assess his urinary symptomatology we will obtain  a clean catch urinalysis and urine C&S today. Should infection be present we'll Institute appropriate antibiotic therapy. We'll we will also closely monitor his complaints of mid back pain and numbness and tingling from the hip to the knees bilaterally as this may warrant imaging to look for possible metastatic disease in the spine.   [name], Remee Charley E, PA-C     All questions were answered. The patient knows to call the clinic with any problems, questions or concerns. We can certainly see the patient much sooner if necessary.  I spent 20 minutes counseling the patient face to face. The total time spent in the appointment was 30 minutes.

## 2012-02-22 NOTE — Patient Instructions (Signed)
[facility] Discharge Instructions for Patients Receiving Chemotherapy  Today you received the following chemotherapy agents Etoposide.  To help prevent nausea and vomiting after your treatment, we encourage you to take your nausea medication.   If you develop nausea and vomiting that is not controlled by your nausea medication, call the clinic. If it is after clinic hours your family physician or the after hours number for the clinic or go to the Emergency Department.   BELOW ARE SYMPTOMS THAT SHOULD BE REPORTED IMMEDIATELY:  *FEVER GREATER THAN 100.5 F  *CHILLS WITH OR WITHOUT FEVER  NAUSEA AND VOMITING THAT IS NOT CONTROLLED WITH YOUR NAUSEA MEDICATION  *UNUSUAL SHORTNESS OF BREATH  *UNUSUAL BRUISING OR BLEEDING  TENDERNESS IN MOUTH AND THROAT WITH OR WITHOUT PRESENCE OF ULCERS  *URINARY PROBLEMS  *BOWEL PROBLEMS  UNUSUAL RASH Items with * indicate a potential emergency and should be followed up as soon as possible.  One of the nurses will contact you 24 hours after your treatment. Please let the nurse know about any problems that you may have experienced. Feel free to call the clinic you have any questions or concerns. The clinic phone number is [phone].   I have been informed and understand all the instructions given to me. I know to contact the clinic, my physician, or go to the Emergency Department if any problems should occur. I do not have any questions at this time, but understand that I may call the clinic during office hours   should I have any questions or need assistance in obtaining follow up care.    __________________________________________  _____________  __________ Signature of Patient or Authorized Representative            Date                   Time    __________________________________________ Nurse's Signature    

## 2012-02-23 ENCOUNTER — Ambulatory Visit (HOSPITAL_BASED_OUTPATIENT_CLINIC_OR_DEPARTMENT_OTHER): Payer: Medicare Other

## 2012-02-23 DIAGNOSIS — C3490 Malignant neoplasm of unspecified part of unspecified bronchus or lung: Secondary | ICD-10-CM

## 2012-02-23 DIAGNOSIS — C7A1 Malignant poorly differentiated neuroendocrine tumors: Secondary | ICD-10-CM

## 2012-02-23 DIAGNOSIS — Z5111 Encounter for antineoplastic chemotherapy: Secondary | ICD-10-CM

## 2012-02-23 LAB — URINE CULTURE

## 2012-02-23 MED ORDER — PROCHLORPERAZINE MALEATE 10 MG PO TABS
10.0000 mg | ORAL_TABLET | Freq: Once | ORAL | Status: AC
  Administered 2012-02-23: 10 mg via ORAL

## 2012-02-23 MED ORDER — SODIUM CHLORIDE 0.9 % IV SOLN
100.0000 mg/m2 | Freq: Once | INTRAVENOUS | Status: AC
  Administered 2012-02-23: 240 mg via INTRAVENOUS
  Filled 2012-02-23: qty 12

## 2012-02-23 MED ORDER — SODIUM CHLORIDE 0.9 % IV SOLN
Freq: Once | INTRAVENOUS | Status: AC
  Administered 2012-02-23: 14:00:00 via INTRAVENOUS

## 2012-02-23 NOTE — Patient Instructions (Addendum)
[facility] Discharge Instructions for Patients Receiving Chemotherapy  Today you received the following chemotherapy [id number]  To help prevent nausea and vomiting after your treatment, we encourage you to take your nausea medication as directed   If you develop nausea and vomiting that is not controlled by your nausea medication, call the clinic. If it is after clinic hours your family physician or the after hours number for the clinic or go to the Emergency Department.   BELOW ARE SYMPTOMS THAT SHOULD BE REPORTED IMMEDIATELY:  *FEVER GREATER THAN 100.5 F  *CHILLS WITH OR WITHOUT FEVER  NAUSEA AND VOMITING THAT IS NOT CONTROLLED WITH YOUR NAUSEA MEDICATION  *UNUSUAL SHORTNESS OF BREATH  *UNUSUAL BRUISING OR BLEEDING  TENDERNESS IN MOUTH AND THROAT WITH OR WITHOUT PRESENCE OF ULCERS  *URINARY PROBLEMS  *BOWEL PROBLEMS  UNUSUAL RASH Items with * indicate a potential emergency and should be followed up as soon as possible.  One of the nurses will contact you 24 hours after your treatment. Please let the nurse know about any problems that you may have experienced. Feel free to call the clinic you have any questions or concerns. The clinic phone number is [phone].   I have been informed and understand all the instructions given to me. I know to contact the clinic, my physician, or go to the Emergency Department if any problems should occur. I do not have any questions at this time, but understand that I may call the clinic during office hours   should I have any questions or need assistance in obtaining follow up care.    __________________________________________  _____________  __________ Signature of Patient or Authorized Representative            Date                   Time    __________________________________________ Nurse's Signature

## 2012-02-24 ENCOUNTER — Ambulatory Visit (HOSPITAL_BASED_OUTPATIENT_CLINIC_OR_DEPARTMENT_OTHER): Payer: Medicare Other

## 2012-02-24 VITALS — BP 123/65 | HR 49 | Temp 98.4°F

## 2012-02-24 DIAGNOSIS — C3490 Malignant neoplasm of unspecified part of unspecified bronchus or lung: Secondary | ICD-10-CM

## 2012-02-24 DIAGNOSIS — C7A1 Malignant poorly differentiated neuroendocrine tumors: Secondary | ICD-10-CM

## 2012-02-24 MED ORDER — PEGFILGRASTIM INJECTION 6 MG/0.6ML
6.0000 mg | Freq: Once | SUBCUTANEOUS | Status: AC
  Administered 2012-02-24: 6 mg via SUBCUTANEOUS
  Filled 2012-02-24: qty 0.6

## 2012-02-28 ENCOUNTER — Other Ambulatory Visit (HOSPITAL_BASED_OUTPATIENT_CLINIC_OR_DEPARTMENT_OTHER): Payer: Medicare Other | Admitting: Lab

## 2012-02-28 DIAGNOSIS — C3490 Malignant neoplasm of unspecified part of unspecified bronchus or lung: Secondary | ICD-10-CM

## 2012-02-28 LAB — CBC WITH DIFFERENTIAL/PLATELET
BASO%: 0.7 % (ref 0.0–2.0)
EOS%: 0.2 % (ref 0.0–7.0)
MCH: 34.1 pg — ABNORMAL HIGH (ref 27.2–33.4)
MCHC: 35.1 g/dL (ref 32.0–36.0)
RBC: 3.81 10*6/uL — ABNORMAL LOW (ref 4.20–5.82)
RDW: 12.6 % (ref 11.0–14.6)
lymph#: 1.1 10*3/uL (ref 0.9–3.3)

## 2012-02-28 LAB — COMPREHENSIVE METABOLIC PANEL (CC13)
ALT: 13 U/L (ref 0–55)
AST: 14 U/L (ref 5–34)
Calcium: 10 mg/dL (ref 8.4–10.4)
Chloride: 106 mEq/L (ref 98–107)
Creatinine: 1.3 mg/dL (ref 0.7–1.3)
Potassium: 4.1 mEq/L (ref 3.5–5.1)
Sodium: 140 mEq/L (ref 136–145)

## 2012-03-06 ENCOUNTER — Other Ambulatory Visit (HOSPITAL_BASED_OUTPATIENT_CLINIC_OR_DEPARTMENT_OTHER): Payer: Medicare Other

## 2012-03-06 DIAGNOSIS — C3490 Malignant neoplasm of unspecified part of unspecified bronchus or lung: Secondary | ICD-10-CM

## 2012-03-06 LAB — COMPREHENSIVE METABOLIC PANEL (CC13)
Albumin: 3.5 g/dL (ref 3.5–5.0)
BUN: 11 mg/dL (ref 7.0–26.0)
CO2: 25 mEq/L (ref 22–29)
Calcium: 10.3 mg/dL (ref 8.4–10.4)
Chloride: 106 mEq/L (ref 98–107)
Creatinine: 1.3 mg/dL (ref 0.7–1.3)
Potassium: 3.9 mEq/L (ref 3.5–5.1)

## 2012-03-06 LAB — CBC WITH DIFFERENTIAL/PLATELET
BASO%: 0.2 % (ref 0.0–2.0)
EOS%: 0.1 % (ref 0.0–7.0)
HCT: 37.9 % — ABNORMAL LOW (ref 38.4–49.9)
LYMPH%: 12.6 % — ABNORMAL LOW (ref 14.0–49.0)
MCH: 33.6 pg — ABNORMAL HIGH (ref 27.2–33.4)
MCHC: 34.4 g/dL (ref 32.0–36.0)
NEUT%: 82.9 % — ABNORMAL HIGH (ref 39.0–75.0)
lymph#: 2 10*3/uL (ref 0.9–3.3)

## 2012-03-09 ENCOUNTER — Ambulatory Visit (HOSPITAL_COMMUNITY)
Admission: RE | Admit: 2012-03-09 | Discharge: 2012-03-09 | Disposition: A | Discharge status: Home / Self Care | Payer: Medicare Other | Source: Ambulatory Visit | Attending: Physician Assistant | Admitting: Physician Assistant

## 2012-03-09 DIAGNOSIS — I517 Cardiomegaly: Secondary | ICD-10-CM | POA: Insufficient documentation

## 2012-03-09 DIAGNOSIS — C3490 Malignant neoplasm of unspecified part of unspecified bronchus or lung: Secondary | ICD-10-CM

## 2012-03-09 DIAGNOSIS — R05 Cough: Secondary | ICD-10-CM | POA: Insufficient documentation

## 2012-03-09 DIAGNOSIS — N400 Enlarged prostate without lower urinary tract symptoms: Secondary | ICD-10-CM | POA: Insufficient documentation

## 2012-03-09 DIAGNOSIS — Q619 Cystic kidney disease, unspecified: Secondary | ICD-10-CM | POA: Insufficient documentation

## 2012-03-09 DIAGNOSIS — R059 Cough, unspecified: Secondary | ICD-10-CM | POA: Insufficient documentation

## 2012-03-09 DIAGNOSIS — N62 Hypertrophy of breast: Secondary | ICD-10-CM | POA: Insufficient documentation

## 2012-03-09 MED ORDER — IOHEXOL 300 MG/ML  SOLN
100.0000 mL | Freq: Once | INTRAMUSCULAR | Status: AC | PRN
  Administered 2012-03-09: 100 mL via INTRAVENOUS

## 2012-03-13 ENCOUNTER — Telehealth: Payer: Self-pay | Admitting: Internal Medicine

## 2012-03-13 ENCOUNTER — Ambulatory Visit (HOSPITAL_BASED_OUTPATIENT_CLINIC_OR_DEPARTMENT_OTHER): Payer: Medicare Other | Admitting: Internal Medicine

## 2012-03-13 ENCOUNTER — Ambulatory Visit (HOSPITAL_BASED_OUTPATIENT_CLINIC_OR_DEPARTMENT_OTHER): Payer: Medicare Other

## 2012-03-13 ENCOUNTER — Other Ambulatory Visit (HOSPITAL_BASED_OUTPATIENT_CLINIC_OR_DEPARTMENT_OTHER): Payer: Medicare Other | Admitting: Lab

## 2012-03-13 VITALS — BP 101/69 | HR 85 | Temp 98.5°F | Resp 18 | Ht 73.0 in | Wt 252.2 lb

## 2012-03-13 DIAGNOSIS — I10 Essential (primary) hypertension: Secondary | ICD-10-CM

## 2012-03-13 DIAGNOSIS — C7A1 Malignant poorly differentiated neuroendocrine tumors: Secondary | ICD-10-CM

## 2012-03-13 DIAGNOSIS — C3490 Malignant neoplasm of unspecified part of unspecified bronchus or lung: Secondary | ICD-10-CM

## 2012-03-13 DIAGNOSIS — Z5111 Encounter for antineoplastic chemotherapy: Secondary | ICD-10-CM

## 2012-03-13 LAB — CBC WITH DIFFERENTIAL/PLATELET
BASO%: 0.3 % (ref 0.0–2.0)
Basophils Absolute: 0 10*3/uL (ref 0.0–0.1)
EOS%: 0.4 % (ref 0.0–7.0)
HCT: 38.7 % (ref 38.4–49.9)
HGB: 13.3 g/dL (ref 13.0–17.1)
MCHC: 34.4 g/dL (ref 32.0–36.0)
MONO#: 0.7 10*3/uL (ref 0.1–0.9)
NEUT%: 69.3 % (ref 39.0–75.0)
RDW: 14.8 % — ABNORMAL HIGH (ref 11.0–14.6)
WBC: 7.2 10*3/uL (ref 4.0–10.3)
lymph#: 1.5 10*3/uL (ref 0.9–3.3)

## 2012-03-13 LAB — COMPREHENSIVE METABOLIC PANEL (CC13)
AST: 19 U/L (ref 5–34)
Albumin: 3.6 g/dL (ref 3.5–5.0)
Alkaline Phosphatase: 91 U/L (ref 40–150)
BUN: 20 mg/dL (ref 7.0–26.0)
Calcium: 10.4 mg/dL (ref 8.4–10.4)
Chloride: 108 mEq/L — ABNORMAL HIGH (ref 98–107)
Potassium: 4.4 mEq/L (ref 3.5–5.1)
Sodium: 137 mEq/L (ref 136–145)
Total Protein: 6.7 g/dL (ref 6.4–8.3)

## 2012-03-13 MED ORDER — ONDANSETRON 16 MG/50ML IVPB (CHCC)
16.0000 mg | Freq: Once | INTRAVENOUS | Status: AC
  Administered 2012-03-13: 16 mg via INTRAVENOUS

## 2012-03-13 MED ORDER — POTASSIUM CHLORIDE 2 MEQ/ML IV SOLN
Freq: Once | INTRAVENOUS | Status: AC
  Administered 2012-03-13: 12:00:00 via INTRAVENOUS
  Filled 2012-03-13: qty 10

## 2012-03-13 MED ORDER — IRINOTECAN HCL CHEMO INJECTION 100 MG/5ML
66.0000 mg/m2 | Freq: Once | INTRAVENOUS | Status: AC
  Administered 2012-03-13: 160 mg via INTRAVENOUS
  Filled 2012-03-13: qty 8

## 2012-03-13 MED ORDER — SODIUM CHLORIDE 0.9 % IJ SOLN
10.0000 mL | INTRAMUSCULAR | Status: DC | PRN
  Filled 2012-03-13: qty 10

## 2012-03-13 MED ORDER — DEXAMETHASONE SODIUM PHOSPHATE 4 MG/ML IJ SOLN
20.0000 mg | Freq: Once | INTRAMUSCULAR | Status: AC
  Administered 2012-03-13: 20 mg via INTRAVENOUS

## 2012-03-13 MED ORDER — HEPARIN SOD (PORK) LOCK FLUSH 100 UNIT/ML IV SOLN
500.0000 [IU] | Freq: Once | INTRAVENOUS | Status: DC | PRN
  Filled 2012-03-13: qty 5

## 2012-03-13 MED ORDER — ATROPINE SULFATE 0.4 MG/ML IJ SOLN
0.5000 mg | Freq: Once | INTRAMUSCULAR | Status: DC | PRN
  Filled 2012-03-13: qty 1.25

## 2012-03-13 MED ORDER — SODIUM CHLORIDE 0.9 % IV SOLN
Freq: Once | INTRAVENOUS | Status: AC
  Administered 2012-03-13: 11:00:00 via INTRAVENOUS

## 2012-03-13 MED ORDER — SODIUM CHLORIDE 0.9 % IV SOLN
29.0000 mg/m2 | Freq: Once | INTRAVENOUS | Status: AC
  Administered 2012-03-13: 70 mg via INTRAVENOUS
  Filled 2012-03-13: qty 70

## 2012-03-13 NOTE — Progress Notes (Signed)
Pt urinated 300 cc prior to 1330.  TKF Output after therapy started 200 and count 1.  Pt tolerated treatment well.  Discharged to home.

## 2012-03-13 NOTE — Patient Instructions (Addendum)
[facility] Discharge Instructions for Patients Receiving Chemotherapy  Today you received the following chemotherapy agents: cisplatin, irinotecan.    To help prevent nausea and vomiting after your treatment, we encourage you to take your nausea medication.   Take it as often as prescribed.     If you develop nausea and vomiting that is not controlled by your nausea medication, call the clinic. If it is after clinic hours your family physician or the after hours number for the clinic or go to the Emergency Department.   BELOW ARE SYMPTOMS THAT SHOULD BE REPORTED IMMEDIATELY:  *FEVER GREATER THAN 100.5 F  *CHILLS WITH OR WITHOUT FEVER  NAUSEA AND VOMITING THAT IS NOT CONTROLLED WITH YOUR NAUSEA MEDICATION  *UNUSUAL SHORTNESS OF BREATH  *UNUSUAL BRUISING OR BLEEDING  TENDERNESS IN MOUTH AND THROAT WITH OR WITHOUT PRESENCE OF ULCERS  *URINARY PROBLEMS  *BOWEL PROBLEMS  UNUSUAL RASH Items with * indicate a potential emergency and should be followed up as soon as possible.  One of the nurses will contact you 24 hours after your treatment. Please let the nurse know about any problems that you may have experienced. Feel free to call the clinic you have any questions or concerns. The clinic phone number is [phone].   I have been informed and understand all the instructions given to me. I know to contact the clinic, my physician, or go to the Emergency Department if any problems should occur. I do not have any questions at this time, but understand that I may call the clinic during office hours   should I have any questions or need assistance in obtaining follow up care.    __________________________________________  _____________  __________ Signature of Patient or Authorized Representative            Date                   Time    __________________________________________ Nurse's Signature   Cisplatin injection What is this medicine? CISPLATIN (SIS  pla tin) is a chemotherapy drug. It targets fast dividing cells, like cancer cells, and causes these cells to die. This medicine is used to treat many types of cancer like bladder, ovarian, and testicular cancers. This medicine may be used for other purposes; ask your health care provider or pharmacist if you have questions. What should I tell my health care provider before I take this medicine? They need to know if you have any of these conditions: -blood disorders -hearing problems -kidney disease -recent or ongoing radiation therapy -an unusual or allergic reaction to cisplatin, carboplatin, other chemotherapy, other medicines, foods, dyes, or preservatives -pregnant or trying to get pregnant -breast-feeding How should I use this medicine? This drug is given as an infusion into a vein. It is administered in a hospital or clinic by a specially trained health care professional. Talk to your pediatrician regarding the use of this medicine in children. Special care may be needed. Overdosage: If you think you have taken too much of this medicine contact a poison control center or emergency room at once. NOTE: This medicine is only for you. Do not share this medicine with others. What if I miss a dose? It is important not to miss a dose. Call your doctor or health care professional if you are unable to keep an appointment. What may interact with this medicine? -dofetilide -foscarnet -medicines for seizures -medicines to increase blood counts like filgrastim, pegfilgrastim, sargramostim -probenecid -pyridoxine used with altretamine -rituximab -  some antibiotics like amikacin, gentamicin, neomycin, polymyxin B, streptomycin, tobramycin -sulfinpyrazone -vaccines -zalcitabine Talk to your doctor or health care professional before taking any of these medicines: -acetaminophen -aspirin -ibuprofen -ketoprofen -naproxen This list may not describe all possible interactions. Give your health  care provider a list of all the medicines, herbs, non-prescription drugs, or dietary supplements you use. Also tell them if you smoke, drink alcohol, or use illegal drugs. Some items may interact with your medicine. What should I watch for while using this medicine? Your condition will be monitored carefully while you are receiving this medicine. You will need important blood work done while you are taking this medicine. This drug may make you feel generally unwell. This is not uncommon, as chemotherapy can affect healthy cells as well as cancer cells. Report any side effects. Continue your course of treatment even though you feel ill unless your doctor tells you to stop. In some cases, you may be given additional medicines to help with side effects. Follow all directions for their use. Call your doctor or health care professional for advice if you get a fever, chills or sore throat, or other symptoms of a cold or flu. Do not treat yourself. This drug decreases your body's ability to fight infections. Try to avoid being around people who are sick. This medicine may increase your risk to bruise or bleed. Call your doctor or health care professional if you notice any unusual bleeding. Be careful brushing and flossing your teeth or using a toothpick because you may get an infection or bleed more easily. If you have any dental work done, tell your dentist you are receiving this medicine. Avoid taking products that contain aspirin, acetaminophen, ibuprofen, naproxen, or ketoprofen unless instructed by your doctor. These medicines may hide a fever. Do not become pregnant while taking this medicine. Women should inform their doctor if they wish to become pregnant or think they might be pregnant. There is a potential for serious side effects to an unborn child. Talk to your health care professional or pharmacist for more information. Do not breast-feed an infant while taking this medicine. Drink fluids as directed  while you are taking this medicine. This will help protect your kidneys. Call your doctor or health care professional if you get diarrhea. Do not treat yourself. What side effects may I notice from receiving this medicine? Side effects that you should report to your doctor or health care professional as soon as possible: -allergic reactions like skin rash, itching or hives, swelling of the face, lips, or tongue -signs of infection - fever or chills, cough, sore throat, pain or difficulty passing urine -signs of decreased platelets or bleeding - bruising, pinpoint red spots on the skin, black, tarry stools, nosebleeds -signs of decreased red blood cells - unusually weak or tired, fainting spells, lightheadedness -breathing problems -changes in hearing -gout pain -low blood counts - This drug may decrease the number of white blood cells, red blood cells and platelets. You may be at increased risk for infections and bleeding. -nausea and vomiting -pain, swelling, redness or irritation at the injection site -pain, tingling, numbness in the hands or feet -problems with balance, movement -trouble passing urine or change in the amount of urine Side effects that usually do not require medical attention (report to your doctor or health care professional if they continue or are bothersome): -changes in vision -loss of appetite -metallic taste in the mouth or changes in taste This list may not describe all possible side  effects. Call your doctor for medical advice about side effects. You may report side effects to FDA at 1-800-FDA-1088. Where should I keep my medicine? This drug is given in a hospital or clinic and will not be stored at home. NOTE: This sheet is a summary. It may not cover all possible information. If you have questions about this medicine, talk to your doctor, pharmacist, or health care provider.  2012, [employer]. (09/04/2007 2:40:54 PM)Irinotecan injection What is this  medicine? IRINOTECAN (ir in oh TEE kan ) is a chemotherapy drug. It is used to treat colon and rectal cancer. This medicine may be used for other purposes; ask your health care provider or pharmacist if you have questions. What should I tell my health care provider before I take this medicine? They need to know if you have any of these conditions: -blood disorders -dehydration -diarrhea -infection (especially a virus infection such as chickenpox, cold sores, or herpes) -liver disease -low blood counts, like low white cell, platelet, or red cell counts -recent or ongoing radiation therapy -an unusual or allergic reaction to irinotecan, sorbitol, other chemotherapy, other medicines, foods, dyes, or preservatives -pregnant or trying to get pregnant -breast-feeding How should I use this medicine? This drug is given as an infusion into a vein. It is administered in a hospital or clinic by a specially trained health care professional. Talk to your pediatrician regarding the use of this medicine in children. Special care may be needed. Overdosage: If you think you have taken too much of this medicine contact a poison control center or emergency room at once. NOTE: This medicine is only for you. Do not share this medicine with others. What if I miss a dose? It is important not to miss your dose. Call your doctor or health care professional if you are unable to keep an appointment. What may interact with this medicine? Do not take this medicine with any of the following medications: -atazanavir -ketoconazole -St. John's Wort This medicine may also interact with the following medications: -dexamethasone -diuretics -laxatives -medicines for seizures like carbamazepine, mephobarbital, phenobarbital, phenytoin, primidone -medicines to increase blood counts like filgrastim, pegfilgrastim, sargramostim -prochlorperazine -vaccines This list may not describe all possible interactions. Give your  health care provider a list of all the medicines, herbs, non-prescription drugs, or dietary supplements you use. Also tell them if you smoke, drink alcohol, or use illegal drugs. Some items may interact with your medicine. What should I watch for while using this medicine? Your condition will be monitored carefully while you are receiving this medicine. You will need important blood work done while you are taking this medicine. This drug may make you feel generally unwell. This is not uncommon, as chemotherapy can affect healthy cells as well as cancer cells. Report any side effects. Continue your course of treatment even though you feel ill unless your doctor tells you to stop. In some cases, you may be given additional medicines to help with side effects. Follow all directions for their use. You may get drowsy or dizzy. Do not drive, use machinery, or do anything that needs mental alertness until you know how this medicine affects you. Do not stand or sit up quickly, especially if you are an older patient. This reduces the risk of dizzy or fainting spells. Call your doctor or health care professional for advice if you get a fever, chills or sore throat, or other symptoms of a cold or flu. Do not treat yourself. This drug decreases your body's  ability to fight infections. Try to avoid being around people who are sick. This medicine may increase your risk to bruise or bleed. Call your doctor or health care professional if you notice any unusual bleeding. Be careful brushing and flossing your teeth or using a toothpick because you may get an infection or bleed more easily. If you have any dental work done, tell your dentist you are receiving this medicine. Avoid taking products that contain aspirin, acetaminophen, ibuprofen, naproxen, or ketoprofen unless instructed by your doctor. These medicines may hide a fever. Do not become pregnant while taking this medicine. Women should inform their doctor if they  wish to become pregnant or think they might be pregnant. There is a potential for serious side effects to an unborn child. Talk to your health care professional or pharmacist for more information. Do not breast-feed an infant while taking this medicine. What side effects may I notice from receiving this medicine? Side effects that you should report to your doctor or health care professional as soon as possible: -allergic reactions like skin rash, itching or hives, swelling of the face, lips, or tongue -low blood counts - this medicine may decrease the number of white blood cells, red blood cells and platelets. You may be at increased risk for infections and bleeding. -signs of infection - fever or chills, cough, sore throat, pain or difficulty passing urine -signs of decreased platelets or bleeding - bruising, pinpoint red spots on the skin, black, tarry stools, blood in the urine -signs of decreased red blood cells - unusually weak or tired, fainting spells, lightheadedness -breathing problems -chest pain -diarrhea -feeling faint or lightheaded, falls -flushing, runny nose, sweating during infusion -mouth sores or pain -pain, swelling, redness or irritation where injected -pain, swelling, warmth in the leg -pain, tingling, numbness in the hands or feet -problems with balance, talking, walking -stomach cramps, pain -trouble passing urine or change in the amount of urine -vomiting as to be unable to hold down drinks or food -yellowing of the eyes or skin Side effects that usually do not require medical attention (report to your doctor or health care professional if they continue or are bothersome): -constipation -hair loss -headache -loss of appetite -nausea, vomiting -stomach upset This list may not describe all possible side effects. Call your doctor for medical advice about side effects. You may report side effects to FDA at 1-800-FDA-1088. Where should I keep my medicine? This drug  is given in a hospital or clinic and will not be stored at home. NOTE: This sheet is a summary. It may not cover all possible information. If you have questions about this medicine, talk to your doctor, pharmacist, or health care provider.  2012, [employer]. (10/16/2007 4:29:12 PM)

## 2012-03-13 NOTE — Telephone Encounter (Signed)
gv and printed appt for pt. °

## 2012-03-13 NOTE — Progress Notes (Signed)
[facility] [phone]   Fax:([phone]) [phone]  OFFICE PROGRESS NOTE  [name], MD [phone] [place] [address]  DIAGNOSIS: Extensive stage small cell lung cancer diagnosed in August of 2013.  PRIOR THERAPY: Systemic chemotherapy in the form of carboplatin for an AUC of 5 given on day 1 and etoposide 100 mg per meter squared given on days 1, 2 and 3 with Neulasta support given on day 4. Status post 2 cycle, discontinued today secondary to disease progression   CURRENT THERAPY: The patient will start today the first cycle of systemic chemotherapy with cisplatin 60 mg/M2 and irinotecan 65 mg/M2 on days 1 and 8 every 3 weeks.    INTERVAL HISTORY: [name] 71 y.o. male returns to the clinic today for followup visit. The patient is doing fine today with no specific complaints. He tolerated the previous 2 cycles of systemic chemotherapy with carboplatin and etoposide fairly well with no significant adverse effects except for mild fatigue. He denied having any significant weight loss or night sweats. He has no nausea or vomiting. No fever or chills. The patient denied having any significant chest pain but continues to have shortness breath with exertion, no cough or hemoptysis. He has repeat CT scan of the chest, abdomen and pelvis performed recently and he is here today for evaluation and discussion of his scan results.  MEDICAL HISTORY: Past Medical History  Diagnosis Date  . Hypertension   . ALLERGIC RHINITIS   . Malignant neoplasm of bronchus and lung, unspecified site 01/23/2012    ALLERGIES:   has no known allergies.  MEDICATIONS:  Current Outpatient Prescriptions  Medication Sig Dispense Refill  . HYDROcodone-acetaminophen (VICODIN) 5-500 MG per tablet 1/2 to 1 tablet by mouth every 6 hours as needed for pain  30 tablet  0  . olmesartan-hydrochlorothiazide (BENICAR HCT) 40-12.5 MG per tablet Take 1 tablet by mouth daily.      .  prochlorperazine (COMPAZINE) 10 MG tablet Take 1 tablet (10 mg total) by mouth every 6 (six) hours as needed.  30 tablet  0    SURGICAL HISTORY:  Past Surgical History  Procedure Date  . Wrist surgery 1961    right wrist    REVIEW OF SYSTEMS:  A comprehensive review of systems was negative except for: Constitutional: positive for fatigue   PHYSICAL EXAMINATION: General appearance: alert, cooperative and no distress Head: Normocephalic, without obvious abnormality, atraumatic Neck: no adenopathy Lymph nodes: Cervical, supraclavicular, and axillary nodes normal. Resp: clear to auscultation bilaterally Cardio: regular rate and rhythm, S1, S2 normal, no murmur, click, rub or gallop GI: soft, non-tender; bowel sounds normal; no masses,  no organomegaly Extremities: extremities normal, atraumatic, no cyanosis or edema Neurologic: Alert and oriented X 3, normal strength and tone. Normal symmetric reflexes. Normal coordination and gait  ECOG PERFORMANCE STATUS: 1 - Symptomatic but completely ambulatory  Blood pressure 101/69, pulse 85, temperature 98.5 F (36.9 C), temperature source Oral, resp. rate 18, height 6\' 1"  (1.854 m), weight 252 lb 3.2 oz (114.397 kg).  LABORATORY DATA: Lab Results  Component Value Date   WBC 7.2 03/13/2012   HGB 13.3 03/13/2012   HCT 38.7 03/13/2012   MCV 95.8 03/13/2012   PLT 120* 03/13/2012      Chemistry      Component Value Date/Time   NA 141 03/06/2012 1413   NA 139 01/31/2012 1308   K 3.9 03/06/2012 1413   K 4.0 01/31/2012 1308  CL 106 03/06/2012 1413   CL 107 01/31/2012 1308   CO2 25 03/06/2012 1413   CO2 25 01/31/2012 1308   BUN 11.0 03/06/2012 1413   BUN 17 01/31/2012 1308   CREATININE 1.3 03/06/2012 1413   CREATININE 1.34 01/31/2012 1308      Component Value Date/Time   CALCIUM 10.3 03/06/2012 1413   CALCIUM 10.0 01/31/2012 1308   ALKPHOS 122 03/06/2012 1413   ALKPHOS 55 01/31/2012 1308   AST 21 03/06/2012 1413   AST 20 01/31/2012 1308   ALT 18  03/06/2012 1413   ALT 17 01/31/2012 1308   BILITOT 0.30 03/06/2012 1413   BILITOT 0.6 01/31/2012 1308       RADIOGRAPHIC STUDIES: Ct Chest W Contrast  03/09/2012  *RADIOLOGY REPORT*  Clinical Data:  Recently diagnosed lung cancer currently undergoing chemotherapy.  Cough.  Restaging scan.  CT CHEST, ABDOMEN AND PELVIS WITH CONTRAST  Technique:  Multidetector CT imaging of the chest, abdomen and pelvis was performed following the standard protocol during bolus administration of intravenous contrast.  Contrast: OMNIPAQUE IOHEXOL 300 MG/ML  SOLN  Comparison:  Chest abdomen and pelvis CT 01/10/2012.  PET CT 01/27/2012.  CT CHEST  Findings:  Mediastinum: Heart size is mildly enlarged. There is no significant pericardial fluid, thickening or pericardial calcification. There is atherosclerosis of the thoracic aorta, the great vessels of the mediastinum and the coronary arteries, including calcified atherosclerotic plaque in the left main, left anterior descending, left circumflex and right coronary arteries. Calcifications of the aortic valve. Infrahilar lymph nodes in the right lower lobe measuring 1.7 cm in short axis have increased slightly in size compared the prior examination.  However, previously noted subcarinal nodal mass is smaller, currently measuring 1.6 cm in short axis (previously 2.2 cm).  3.2 x 1.6 cm sessile low attenuation lesion immediately lateral to the superior cavoatrial junction and is similar to prior examinations, and favored to represent a pericardial cyst.  Esophagus is unremarkable in appearance.  Lungs/Pleura: Compared to the prior examination, the macrolobulated right lower lobe mass appears to have slightly increased in size, currently measuring 3.3 x 2.7 cm (previously 3.2 x 2.3 cm).  There are distal postobstructive changes in the periphery of the right lower lobe, with increasing peripheral nodularity in the right lower lobe, the main portion of the which currently measures up  to 2.9 x 1.7 cm on image 38 of series 2.  It is uncertain whether not this simply reflects postobstructive pneumonia, or whether not this is indicative of lymphangitic spread of disease with further right lower lobe metastases.  The 1.3 x 1.0 cm right lower lobe pulmonary nodule (image 44 of series 4) has slightly decreased in size.  9 mm left lower lobe nodule (image 46 of series 4) is unchanged in size. 9 mm pleural based nodule (image 30 of series 2) has slightly increased in size (previously 7 mm).  There are patchy areas of subpleural reticulation and some scattered areas of frank honeycombing in the periphery of the lungs bilaterally, similar to the prior study, suggesting either areas of post infectious/inflammatory scarring, or an underlying interstitial lung disease.  Trace right-sided pleural effusion layering dependently.  Musculoskeletal: There are no aggressive appearing lytic or blastic lesions noted in the visualized portions of the skeleton. Bilateral gynecomastia (right greater than left).  IMPRESSION:  1.  Today's study demonstrates a mixed response to therapy. Specifically, while there has been decrease in size of subcarinal lymphadenopathy, the right hilar adenopathy, primary right  lower lobe mass and other smaller pulmonary nodules in the lungs bilaterally have generally increased compared to the prior examination, as detailed above. Accordingly, the overall appearance is most suggestive of progression of disease. 2. Atherosclerosis, including left main and three-vessel coronary artery disease. 3.  Mild cardiomegaly is unchanged. 4.  Smoothly marginated low attenuation lesion adjacent to the superior cavoatrial junction is favored to represent a small pericardial cyst and is similar to priors. 5.  Gynecomastia.   CT ABDOMEN AND PELVIS  Findings:  Abdomen/Pelvis: The appearance of the liver, gallbladder, pancreas, spleen and bilateral adrenal glands is unremarkable.  Numerous nonenhancing  well-defined low attenuation lesions in the kidneys bilaterally are similar to prior examinations and compatible with simple cyst.  There are other subcentimeter low attenuation lesions in the kidneys bilaterally which are too small to definitively characterize, but are also similar, and are also favored to represent small cystic lesions.  Extensive atherosclerosis of the abdominal and pelvic vasculature, without definite aneurysm, dissection or major branch vessel occlusion.  Normal appendix.  No ascites or pneumoperitoneum and no pathologic distension of bowel. No definite pathologic lymphadenopathy identified within the abdomen or pelvis.  Prostate is mildly enlarged.  Urinary bladder is unremarkable in appearance.  Musculoskeletal: There are no aggressive appearing lytic or blastic lesions noted in the visualized portions of the skeleton.  IMPRESSION:  1.  No findings to suggest metastatic disease to the abdomen or pelvis on today's examination. 2.  Multiple bilateral renal cysts appear similar to prior examinations. 3.  Extensive atherosclerosis. 4.  Prostatomegaly.   Original Report Authenticated By: Florencia Reasons, M.D.      ASSESSMENT: This is a very pleasant 71 years old African American male with extensive stage small cell lung cancer status post 2 cycles of systemic chemotherapy with carboplatin and etoposide but unfortunately he continues to have some evidence for disease progression which is very unusual behavior for small cell lung cancer especially with the first line treatment.   PLAN: I discussed the scan results with the patient. I recommended for him to change the chemotherapy to cis-platinum 30 mg/M2 and irinotecan 65 mg/M2 on days 1 and 8 every 3 weeks. The patient would be treated with 2 cycles of this regimen. If he continues to have evidence for disease progression on the upcoming scan, I may consider rebiopsy of one of the lung lesions to reconfirm the diagnosis. I discussed with  the patient adverse effect of the new chemotherapy including but not limited to alopecia, myelosuppression, nausea and vomiting, peripheral neuropathy, liver or renal dysfunction. He would like to proceed with treatment as planned. He would come back for followup visit in 3 weeks for evaluation and management any adverse effect of his treatment. He was advised to call immediately if he has any concerning symptoms in the interval.  All questions were answered. The patient knows to call the clinic with any problems, questions or concerns. We can certainly see the patient much sooner if necessary.  I spent 15 minutes counseling the patient face to face. The total time spent in the appointment was 25 minutes.

## 2012-03-13 NOTE — Patient Instructions (Signed)
You have evidence for disease progression on his recent scan. We discussed changing the chemotherapy to cisplatin and irinotecan, first dose today. Followup in 3 weeks

## 2012-03-14 ENCOUNTER — Telehealth: Payer: Self-pay | Admitting: *Deleted

## 2012-03-14 ENCOUNTER — Ambulatory Visit: Payer: Medicare Other

## 2012-03-14 NOTE — Telephone Encounter (Signed)
Called for chemo follow up call for new Cisplat on 03/13/12. Patient not home, spoke with wife, she states that patient said this morning he was feeling fine. Instructed patient wife to have him call us back if he has any questions or concerns.

## 2012-03-15 ENCOUNTER — Ambulatory Visit: Payer: Medicare Other

## 2012-03-16 ENCOUNTER — Ambulatory Visit: Payer: Medicare Other

## 2012-03-20 ENCOUNTER — Other Ambulatory Visit (HOSPITAL_BASED_OUTPATIENT_CLINIC_OR_DEPARTMENT_OTHER): Payer: Medicare Other

## 2012-03-20 ENCOUNTER — Ambulatory Visit (HOSPITAL_BASED_OUTPATIENT_CLINIC_OR_DEPARTMENT_OTHER): Payer: Medicare Other

## 2012-03-20 ENCOUNTER — Other Ambulatory Visit: Payer: Medicare Other | Admitting: Lab

## 2012-03-20 VITALS — BP 93/61 | HR 83 | Temp 98.4°F | Resp 20

## 2012-03-20 DIAGNOSIS — C3490 Malignant neoplasm of unspecified part of unspecified bronchus or lung: Secondary | ICD-10-CM

## 2012-03-20 DIAGNOSIS — Z5111 Encounter for antineoplastic chemotherapy: Secondary | ICD-10-CM

## 2012-03-20 DIAGNOSIS — C7A1 Malignant poorly differentiated neuroendocrine tumors: Secondary | ICD-10-CM

## 2012-03-20 LAB — COMPREHENSIVE METABOLIC PANEL (CC13)
Albumin: 3.5 g/dL (ref 3.5–5.0)
Alkaline Phosphatase: 74 U/L (ref 40–150)
BUN: 34 mg/dL — ABNORMAL HIGH (ref 7.0–26.0)
CO2: 22 mEq/L (ref 22–29)
Calcium: 10.1 mg/dL (ref 8.4–10.4)
Chloride: 106 mEq/L (ref 98–107)
Glucose: 89 mg/dl (ref 70–99)
Potassium: 4.5 mEq/L (ref 3.5–5.1)
Sodium: 139 mEq/L (ref 136–145)
Total Protein: 6.5 g/dL (ref 6.4–8.3)

## 2012-03-20 LAB — CBC WITH DIFFERENTIAL/PLATELET
BASO%: 1.4 % (ref 0.0–2.0)
EOS%: 1.7 % (ref 0.0–7.0)
HCT: 36.1 % — ABNORMAL LOW (ref 38.4–49.9)
LYMPH%: 35.6 % (ref 14.0–49.0)
MCH: 32.6 pg (ref 27.2–33.4)
MCHC: 34.9 g/dL (ref 32.0–36.0)
MONO#: 0.4 10*3/uL (ref 0.1–0.9)
NEUT%: 49.6 % (ref 39.0–75.0)
Platelets: 176 10*3/uL (ref 140–400)

## 2012-03-20 MED ORDER — POTASSIUM CHLORIDE 2 MEQ/ML IV SOLN
Freq: Once | INTRAVENOUS | Status: AC
  Administered 2012-03-20: 09:00:00 via INTRAVENOUS
  Filled 2012-03-20: qty 10

## 2012-03-20 MED ORDER — ONDANSETRON 16 MG/50ML IVPB (CHCC)
16.0000 mg | Freq: Once | INTRAVENOUS | Status: AC
  Administered 2012-03-20: 16 mg via INTRAVENOUS

## 2012-03-20 MED ORDER — ATROPINE SULFATE 0.4 MG/ML IJ SOLN
0.5000 mg | Freq: Once | INTRAMUSCULAR | Status: DC | PRN
  Filled 2012-03-20: qty 1.25

## 2012-03-20 MED ORDER — SODIUM CHLORIDE 0.9 % IV SOLN
Freq: Once | INTRAVENOUS | Status: AC
  Administered 2012-03-20: 20 mL via INTRAVENOUS

## 2012-03-20 MED ORDER — DEXAMETHASONE SODIUM PHOSPHATE 4 MG/ML IJ SOLN
20.0000 mg | Freq: Once | INTRAMUSCULAR | Status: AC
  Administered 2012-03-20: 20 mg via INTRAVENOUS

## 2012-03-20 MED ORDER — SODIUM CHLORIDE 0.9 % IV SOLN
30.0000 mg/m2 | Freq: Once | INTRAVENOUS | Status: AC
  Administered 2012-03-20: 73 mg via INTRAVENOUS
  Filled 2012-03-20: qty 73

## 2012-03-20 MED ORDER — IRINOTECAN HCL CHEMO INJECTION 100 MG/5ML
65.0000 mg/m2 | Freq: Once | INTRAVENOUS | Status: AC
  Administered 2012-03-20: 158 mg via INTRAVENOUS
  Filled 2012-03-20: qty 7.9

## 2012-03-27 ENCOUNTER — Other Ambulatory Visit (HOSPITAL_BASED_OUTPATIENT_CLINIC_OR_DEPARTMENT_OTHER): Payer: 59

## 2012-03-27 ENCOUNTER — Telehealth: Payer: Self-pay | Admitting: *Deleted

## 2012-03-27 ENCOUNTER — Ambulatory Visit: Payer: 59 | Admitting: Physician Assistant

## 2012-03-27 ENCOUNTER — Other Ambulatory Visit: Payer: Medicare Other | Admitting: Lab

## 2012-03-27 ENCOUNTER — Ambulatory Visit (HOSPITAL_BASED_OUTPATIENT_CLINIC_OR_DEPARTMENT_OTHER): Payer: Medicare Other | Admitting: Physician Assistant

## 2012-03-27 ENCOUNTER — Telehealth: Payer: Self-pay | Admitting: Internal Medicine

## 2012-03-27 VITALS — BP 104/72 | HR 99 | Temp 98.1°F | Resp 20 | Ht 73.0 in | Wt 245.0 lb

## 2012-03-27 DIAGNOSIS — C7A1 Malignant poorly differentiated neuroendocrine tumors: Secondary | ICD-10-CM

## 2012-03-27 DIAGNOSIS — C3490 Malignant neoplasm of unspecified part of unspecified bronchus or lung: Secondary | ICD-10-CM

## 2012-03-27 LAB — CBC WITH DIFFERENTIAL/PLATELET
BASO%: 0.4 % (ref 0.0–2.0)
HCT: 34.3 % — ABNORMAL LOW (ref 38.4–49.9)
LYMPH%: 28.7 % (ref 14.0–49.0)
MCH: 34.5 pg — ABNORMAL HIGH (ref 27.2–33.4)
MCHC: 35.1 g/dL (ref 32.0–36.0)
MCV: 98.3 fL — ABNORMAL HIGH (ref 79.3–98.0)
MONO%: 6.5 % (ref 0.0–14.0)
NEUT%: 61.9 % (ref 39.0–75.0)
Platelets: 127 10*3/uL — ABNORMAL LOW (ref 140–400)
RBC: 3.49 10*6/uL — ABNORMAL LOW (ref 4.20–5.82)
WBC: 4.1 10*3/uL (ref 4.0–10.3)

## 2012-03-27 LAB — COMPREHENSIVE METABOLIC PANEL (CC13)
ALT: 19 U/L (ref 0–55)
Alkaline Phosphatase: 71 U/L (ref 40–150)
Creatinine: 1.7 mg/dL — ABNORMAL HIGH (ref 0.7–1.3)
Sodium: 139 mEq/L (ref 136–145)
Total Bilirubin: 0.4 mg/dL (ref 0.20–1.20)
Total Protein: 6.2 g/dL — ABNORMAL LOW (ref 6.4–8.3)

## 2012-03-27 MED ORDER — HYDROCODONE-ACETAMINOPHEN 5-500 MG PO TABS: ORAL_TABLET | ORAL | Status: DC

## 2012-03-27 NOTE — Telephone Encounter (Signed)
Per staff phone call and POF I have scheduled appts. JMW  

## 2012-03-27 NOTE — Telephone Encounter (Signed)
appts made and printed for pt aom °

## 2012-03-27 NOTE — Patient Instructions (Addendum)
Continue with your chemotherapy as scheduled Monitor your leg discomfort and notify is if it persists or worsens Follow up in 4 weeks prior to your next cycle of chemotherapy

## 2012-04-01 NOTE — Progress Notes (Signed)
[facility] Telephone:(336) [phone]   Fax:(336) [phone]  OFFICE PROGRESS NOTE  [name], MD [phone] [place] [address]  DIAGNOSIS: Extensive stage small cell lung cancer diagnosed in August of 2013.  PRIOR THERAPY: Systemic chemotherapy in the form of carboplatin for an AUC of 5 given on day 1 and etoposide 100 mg per meter squared given on days 1, 2 and 3 with Neulasta support given on day 4. Status post 2 cycle, discontinued today secondary to disease progression   CURRENT THERAPY:  Systemic chemotherapy with cisplatin 60 mg/M2 and irinotecan 65 mg/M2 on days 1 and 8 every 3 weeks. Status post 1 cycle.   INTERVAL HISTORY: [name] 71 y.o. male returns to the clinic today for followup visit. He complains of some indigestion that started after chemotherapy. He has ad a little diarrhea but not enough to take anything for. He reports left knee to hip numbness after he is up on his legs for awhile. It occurs in the right leg also but to a lesser extent. Seem to be concentrated in the thigh area and gets better with rest. He requests a refill for his pain medication. The patient is doing fine today with no specific complaints. He tolerated the previous 2 cycles of systemic chemotherapy with carboplatin and etoposide fairly well with no significant adverse effects except for mild fatigue. He denied having any significant weight loss or night sweats. He has no nausea or vomiting. No fever or chills. The patient denied having any significant chest pain but continues to have shortness breath with exertion, no cough or hemoptysis. He has repeat CT scan of the chest, abdomen and pelvis performed recently and he is here today for evaluation and discussion of his scan results.  MEDICAL HISTORY: Past Medical History  Diagnosis Date  . Hypertension   . ALLERGIC RHINITIS   . Malignant neoplasm of bronchus and lung, unspecified site 01/23/2012    ALLERGIES:    has no known allergies.  MEDICATIONS:  Current Outpatient Prescriptions  Medication Sig Dispense Refill  . HYDROcodone-acetaminophen (VICODIN) 5-500 MG per tablet 1/2 to 1 tablet by mouth every 6 hours as needed for pain  30 tablet  0  . olmesartan-hydrochlorothiazide (BENICAR HCT) 40-12.5 MG per tablet Take 1 tablet by mouth daily.      . prochlorperazine (COMPAZINE) 10 MG tablet Take 1 tablet (10 mg total) by mouth every 6 (six) hours as needed.  30 tablet  0    SURGICAL HISTORY:  Past Surgical History  Procedure Date  . Wrist surgery 1961    right wrist    REVIEW OF SYSTEMS:  A comprehensive review of systems was negative except for: Constitutional: positive for fatigue   PHYSICAL EXAMINATION: General appearance: alert, cooperative and no distress Head: Normocephalic, without obvious abnormality, atraumatic Neck: no adenopathy Lymph nodes: Cervical, supraclavicular, and axillary nodes normal. Resp: clear to auscultation bilaterally Cardio: regular rate and rhythm, S1, S2 normal, no murmur, click, rub or gallop GI: soft, non-tender; bowel sounds normal; no masses,  no organomegaly Extremities: extremities normal, atraumatic, no cyanosis or edema Neurologic: Alert and oriented X 3, normal strength and tone. Normal symmetric reflexes. Normal coordination and gait  ECOG PERFORMANCE STATUS: 1 - Symptomatic but completely ambulatory  Blood pressure 104/72, pulse 99, temperature 98.1 F (36.7 C), temperature source Oral, resp. rate 20, height 6\' 1"  (1.854 m), weight 245 lb (111.131 kg).  LABORATORY DATA: Lab Results  Component Value  Date   WBC 4.1 03/27/2012   HGB 12.0* 03/27/2012   HCT 34.3* 03/27/2012   MCV 98.3* 03/27/2012   PLT 127* 03/27/2012      Chemistry      Component Value Date/Time   NA 139 03/27/2012 1336   NA 139 01/31/2012 1308   K 4.2 03/27/2012 1336   K 4.0 01/31/2012 1308   CL 108* 03/27/2012 1336   CL 107 01/31/2012 1308   CO2 24 03/27/2012 1336   CO2  25 01/31/2012 1308   BUN 26.0 03/27/2012 1336   BUN 17 01/31/2012 1308   CREATININE 1.7* 03/27/2012 1336   CREATININE 1.34 01/31/2012 1308      Component Value Date/Time   CALCIUM 9.8 03/27/2012 1336   CALCIUM 10.0 01/31/2012 1308   ALKPHOS 71 03/27/2012 1336   ALKPHOS 55 01/31/2012 1308   AST 17 03/27/2012 1336   AST 20 01/31/2012 1308   ALT 19 03/27/2012 1336   ALT 17 01/31/2012 1308   BILITOT 0.40 03/27/2012 1336   BILITOT 0.6 01/31/2012 1308       RADIOGRAPHIC STUDIES: Ct Chest W Contrast  03/09/2012  *RADIOLOGY REPORT*  Clinical Data:  Recently diagnosed lung cancer currently undergoing chemotherapy.  Cough.  Restaging scan.  CT CHEST, ABDOMEN AND PELVIS WITH CONTRAST  Technique:  Multidetector CT imaging of the chest, abdomen and pelvis was performed following the standard protocol during bolus administration of intravenous contrast.  Contrast: OMNIPAQUE IOHEXOL 300 MG/ML  SOLN  Comparison:  Chest abdomen and pelvis CT 01/10/2012.  PET CT 01/27/2012.  CT CHEST  Findings:  Mediastinum: Heart size is mildly enlarged. There is no significant pericardial fluid, thickening or pericardial calcification. There is atherosclerosis of the thoracic aorta, the great vessels of the mediastinum and the coronary arteries, including calcified atherosclerotic plaque in the left Franklin, left anterior descending, left circumflex and right coronary arteries. Calcifications of the aortic valve. Infrahilar lymph nodes in the right lower lobe measuring 1.7 cm in short axis have increased slightly in size compared the prior examination.  However, previously noted subcarinal nodal mass is smaller, currently measuring 1.6 cm in short axis (previously 2.2 cm).  3.2 x 1.6 cm sessile low attenuation lesion immediately lateral to the superior cavoatrial junction and is similar to prior examinations, and favored to represent a pericardial cyst.  Esophagus is unremarkable in appearance.  Lungs/Pleura: Compared to the prior  examination, the macrolobulated right lower lobe mass appears to have slightly increased in size, currently measuring 3.3 x 2.7 cm (previously 3.2 x 2.3 cm).  There are distal postobstructive changes in the periphery of the right lower lobe, with increasing peripheral nodularity in the right lower lobe, the Franklin portion of the which currently measures up to 2.9 x 1.7 cm on image 38 of series 2.  It is uncertain whether not this simply reflects postobstructive pneumonia, or whether not this is indicative of lymphangitic spread of disease with further right lower lobe metastases.  The 1.3 x 1.0 cm right lower lobe pulmonary nodule (image 44 of series 4) has slightly decreased in size.  9 mm left lower lobe nodule (image 46 of series 4) is unchanged in size. 9 mm pleural based nodule (image 30 of series 2) has slightly increased in size (previously 7 mm).  There are patchy areas of subpleural reticulation and some scattered areas of frank honeycombing in the periphery of the lungs bilaterally, similar to the prior study, suggesting either areas of post infectious/inflammatory scarring, or an  underlying interstitial lung disease.  Trace right-sided pleural effusion layering dependently.  Musculoskeletal: There are no aggressive appearing lytic or blastic lesions noted in the visualized portions of the skeleton. Bilateral gynecomastia (right greater than left).  IMPRESSION:  1.  Today's study demonstrates a mixed response to therapy. Specifically, while there has been decrease in size of subcarinal lymphadenopathy, the right hilar adenopathy, primary right lower lobe mass and other smaller pulmonary nodules in the lungs bilaterally have generally increased compared to the prior examination, as detailed above. Accordingly, the overall appearance is most suggestive of progression of disease. 2. Atherosclerosis, including left Franklin and three-vessel coronary artery disease. 3.  Mild cardiomegaly is unchanged. 4.  Smoothly  marginated low attenuation lesion adjacent to the superior cavoatrial junction is favored to represent a small pericardial cyst and is similar to priors. 5.  Gynecomastia.   CT ABDOMEN AND PELVIS  Findings:  Abdomen/Pelvis: The appearance of the liver, gallbladder, pancreas, spleen and bilateral adrenal glands is unremarkable.  Numerous nonenhancing well-defined low attenuation lesions in the kidneys bilaterally are similar to prior examinations and compatible with simple cyst.  There are other subcentimeter low attenuation lesions in the kidneys bilaterally which are too small to definitively characterize, but are also similar, and are also favored to represent small cystic lesions.  Extensive atherosclerosis of the abdominal and pelvic vasculature, without definite aneurysm, dissection or major branch vessel occlusion.  Normal appendix.  No ascites or pneumoperitoneum and no pathologic distension of bowel. No definite pathologic lymphadenopathy identified within the abdomen or pelvis.  Prostate is mildly enlarged.  Urinary bladder is unremarkable in appearance.  Musculoskeletal: There are no aggressive appearing lytic or blastic lesions noted in the visualized portions of the skeleton.  IMPRESSION:  1.  No findings to suggest metastatic disease to the abdomen or pelvis on today's examination. 2.  Multiple bilateral renal cysts appear similar to prior examinations. 3.  Extensive atherosclerosis. 4.  Prostatomegaly.   Original Report Authenticated By: Florencia Reasons, M.D.      ASSESSMENT/PLAN: This is a very pleasant 71 years old African American male with extensive stage small cell lung cancer status post 2 cycles of systemic chemotherapy with carboplatin and etoposide but unfortunately he continues to have some evidence for disease progression which is very unusual behavior for small cell lung cancer especially with the first line treatment. He is now being treated with cisplatin and irinotecan, status  post 1 cycle. The patient was discussed with Dr. Arbutus Ped. He will follow up with his next cycle of chemotherapy. He was given a refill prescription for Vicodin.  Laural Benes, Mikala Podoll E, PA-C   All questions were answered. The patient knows to call the clinic with any problems, questions or concerns. We can certainly see the patient much sooner if necessary.  I spent 20 minutes counseling the patient face to face. The total time spent in the appointment was 30 minutes.

## 2012-04-03 ENCOUNTER — Other Ambulatory Visit (HOSPITAL_BASED_OUTPATIENT_CLINIC_OR_DEPARTMENT_OTHER): Payer: Medicare Other

## 2012-04-03 ENCOUNTER — Other Ambulatory Visit: Payer: Medicare Other | Admitting: Lab

## 2012-04-03 ENCOUNTER — Ambulatory Visit: Payer: Medicare Other

## 2012-04-03 DIAGNOSIS — C3490 Malignant neoplasm of unspecified part of unspecified bronchus or lung: Secondary | ICD-10-CM

## 2012-04-03 LAB — CBC WITH DIFFERENTIAL/PLATELET
Eosinophils Absolute: 0.1 10*3/uL (ref 0.0–0.5)
HCT: 35.2 % — ABNORMAL LOW (ref 38.4–49.9)
LYMPH%: 37.5 % (ref 14.0–49.0)
MCHC: 34.9 g/dL (ref 32.0–36.0)
MCV: 97.2 fL (ref 79.3–98.0)
MONO#: 0.1 10*3/uL (ref 0.1–0.9)
MONO%: 4.6 % (ref 0.0–14.0)
NEUT#: 1.4 10*3/uL — ABNORMAL LOW (ref 1.5–6.5)
NEUT%: 53.3 % (ref 39.0–75.0)
Platelets: 68 10*3/uL — ABNORMAL LOW (ref 140–400)
RBC: 3.62 10*6/uL — ABNORMAL LOW (ref 4.20–5.82)
WBC: 2.6 10*3/uL — ABNORMAL LOW (ref 4.0–10.3)
nRBC: 0 % (ref 0–0)

## 2012-04-04 ENCOUNTER — Ambulatory Visit: Payer: Medicare Other

## 2012-04-05 ENCOUNTER — Ambulatory Visit: Payer: Medicare Other

## 2012-04-06 ENCOUNTER — Ambulatory Visit: Payer: Medicare Other

## 2012-04-10 ENCOUNTER — Other Ambulatory Visit: Payer: Medicare Other | Admitting: Lab

## 2012-04-10 ENCOUNTER — Other Ambulatory Visit (HOSPITAL_BASED_OUTPATIENT_CLINIC_OR_DEPARTMENT_OTHER): Payer: Medicare Other | Admitting: Lab

## 2012-04-10 ENCOUNTER — Ambulatory Visit: Payer: Medicare Other

## 2012-04-10 DIAGNOSIS — C3490 Malignant neoplasm of unspecified part of unspecified bronchus or lung: Secondary | ICD-10-CM

## 2012-04-10 LAB — CBC WITH DIFFERENTIAL/PLATELET
BASO%: 1 % (ref 0.0–2.0)
Basophils Absolute: 0 10*3/uL (ref 0.0–0.1)
EOS%: 3.1 % (ref 0.0–7.0)
HCT: 37.8 % — ABNORMAL LOW (ref 38.4–49.9)
HGB: 13.2 g/dL (ref 13.0–17.1)
LYMPH%: 48.2 % (ref 14.0–49.0)
MCH: 34.4 pg — ABNORMAL HIGH (ref 27.2–33.4)
MCHC: 34.9 g/dL (ref 32.0–36.0)
MCV: 98.4 fL — ABNORMAL HIGH (ref 79.3–98.0)
MONO%: 9.2 % (ref 0.0–14.0)
NEUT%: 38.5 % — ABNORMAL LOW (ref 39.0–75.0)

## 2012-04-10 NOTE — Progress Notes (Signed)
Hold treatment today per Dr. Arbutus Ped. Neutropenic precautions and Thrombocytopenia  discussed with patient.

## 2012-04-10 NOTE — Patient Instructions (Addendum)
Neutropenia Neutropenia is a condition that occurs when the level of a certain type of white blood cell (neutrophil) in your body becomes lower than normal. Neutrophils are made in the bone marrow and fight infections. These cells protect against bacteria and viruses. The fewer neutrophils you have, and the longer your body remains without them, the greater your risk of getting a severe infection becomes. CAUSES  The cause of neutropenia may be hard to determine. However, it is usually due to 3 main problems:   Decreased production of neutrophils. This may be due to:  Certain medicines such as chemotherapy.  Genetic problems.  Cancer.  Radiation treatments.  Vitamin deficiency.  Some pesticides.  Increased destruction of neutrophils. This may be due to:  Overwhelming infections.  Hemolytic anemia. This is when the body destroys its own blood cells.  Chemotherapy.  Neutrophils moving to areas of the body where they cannot fight infections. This may be due to:  Dialysis procedures.  Conditions where the spleen becomes enlarged. Neutrophils are held in the spleen and are not available to the rest of the body.  Overwhelming infections. The neutrophils are held in the area of the infection and are not available to the rest of the body. SYMPTOMS  There are no specific symptoms of neutropenia. The lack of neutrophils can result in an infection, and an infection can cause various problems. DIAGNOSIS  Diagnosis is made by a blood test. A complete blood count is performed. The normal level of neutrophils in human blood differs with age and race. Infants have lower counts than older children and adults. African Americans have lower counts than Caucasians or Asians. The average adult level is 1500 cells/mm3 of blood. Neutrophil counts are interpreted as follows:  Greater than 1000 cells/mm3 gives normal protection against infection.  500 to 1000 cells/mm3 gives an increased risk for  infection.  200 to 500 cells/mm3 is a greater risk for severe infection.  Lower than 200 cells/mm3 is a marked risk of infection. This may require hospitalization and treatment with antibiotic medicines. TREATMENT  Treatment depends on the underlying cause, severity, and presence of infections or symptoms. It also depends on your health. Your caregiver will discuss the treatment plan with you. Mild cases are often easily treated and have a good outcome. Preventative measures may also be started to limit your risk of infections. Treatment can include:  Taking antibiotics.  Stopping medicines that are known to cause neutropenia.  Correcting nutritional deficiencies by eating green vegetables to supply folic acid and taking vitamin B supplements.  Stopping exposure to pesticides if your neutropenia is related to pesticide exposure.  Taking a blood growth factor called sargramostim, pegfilgrastim, or filgrastim if you are undergoing chemotherapy for cancer. This stimulates white blood cell production.  Removal of the spleen if you have Felty's syndrome and have repeated infections. HOME CARE INSTRUCTIONS   Follow your caregiver's instructions about when you need to have blood work done.  Wash your hands often. Make sure others who come in contact with you also wash their hands.  Wash raw fruits and vegetables before eating them. They can carry bacteria and fungi.  Avoid people with colds or spreadable (contagious) diseases (chickenpox, herpes zoster, influenza).  Avoid large crowds.  Avoid construction areas. The dust can release fungus into the air.  Be cautious around children in daycare or school environments.  Take care of your respiratory system by coughing and deep breathing.  Bathe daily.  Protect your skin from cuts and   burns.  Do not work in the garden or with flowers and plants.  Care for the mouth before and after meals by brushing with a soft toothbrush. If you have  mucositis, do not use mouthwash. Mouthwash contains alcohol and can dry out the mouth even more.  Clean the area between the genitals and the anus (perineal area) after urination and bowel movements. Women need to wipe from front to back.  Use a water soluble lubricant during sexual intercourse and practice good hygiene after. Do not have intercourse if you are severely neutropenic. Check with your caregiver for guidelines.  Exercise daily as tolerated.  Avoid people who were vaccinated with a live vaccine in the past 30 days. You should not receive live vaccines (polio, typhoid).  Do not provide direct care for pets. Avoid animal droppings. Do not clean litter boxes and bird cages.  Do not share food utensils.  Do not use tampons, enemas, or rectal suppositories unless directed by your caregiver.  Use an electric razor to remove hair.  Wash your hands after handling magazines, letters, and newspapers. SEEK IMMEDIATE MEDICAL CARE IF:   You have a fever.  You have chills or start to shake.  You feel nauseous or vomit.  You develop mouth sores.  You develop aches and pains.  You have redness and swelling around open wounds.  Your skin is warm to the touch.  You have pus coming from your wounds.  You develop swollen lymph nodes.  You feel weak or fatigued.  You develop red streaks on the skin. MAKE SURE YOU:  Understand these instructions.  Will watch your condition.  Will get help right away if you are not doing well or get worse. Document Released: 11/19/2001 Document Revised: 08/22/2011 Document Reviewed: 12/17/2010 [facility] Patient Information 2013 [place], [place].    Thrombocytopenia Thrombocytopenia is a condition in which there is an abnormally small number of platelets in your blood. Platelets are also called thrombocytes. Platelets are needed for blood clotting. CAUSES Thrombocytopenia is caused by:   Decreased production of platelets. This can be  caused by:  Aplastic anemia in which your bone marrow quits making blood cells.  Cancer in the bone marrow.  Use of certain medicines, including chemotherapy.  Infection in the bone marrow.  Heavy alcohol consumption.  Increased destruction of platelets. This can be caused by:  Certain immune diseases.  Use of certain drugs.  Certain blood clotting disorders.  Certain inherited disorders.  Certain bleeding disorders.  Pregnancy.  Having an enlarged spleen (hypersplenism). In hypersplenism, the spleen gathers up platelets from circulation. This means the platelets are not available to help with blood clotting. The spleen can enlarge due to cirrhosis or other conditions. SYMPTOMS  The symptoms of thrombocytopenia are side effects of poor blood clotting. Some of these are:  Abnormal bleeding.  Nosebleeds.  Heavy menstrual periods.  Blood in the urine or stools.  Purpura. This is a purplish discoloration in the skin produced by small bleeding vessels near the surface of the skin.  Bruising.  A rash that may be petechial. This looks like pinpoint, purplish-red spots on the skin and mucous membranes. It is caused by bleeding from small blood vessels (capillaries). DIAGNOSIS  Your caregiver will make this diagnosis based on your exam and blood tests. Sometimes, a bone marrow study is done to look for the original cells (megakaryocytes) that make platelets. TREATMENT  Treatment depends on the cause of the condition.  Medicines may be given to help protect  your platelets from being destroyed.  In some cases, a replacement (transfusion) of platelets may be required to stop or prevent bleeding.  Sometimes, the spleen must be surgically removed. HOME CARE INSTRUCTIONS   Check the skin and linings inside your mouth for bruising or bleeding as directed by your caregiver.  Check your sputum, urine, and stool for blood as directed by your caregiver.  Do not return to any  activities that could cause bumps or bruises until your caregiver says it is okay.  Take extra care not to cut yourself when shaving or when using scissors, needles, knives, and other tools.  Take extra care not to burn yourself when ironing or cooking.  Ask your caregiver if it is okay for you to drink alcohol.  Only take over-the-counter or prescription medicines as directed by your caregiver.  Notify all your caregivers, including dentists and eye doctors, about your condition. SEEK IMMEDIATE MEDICAL CARE IF:   You develop active bleeding from anywhere in your body.  You develop unexplained bruising or bleeding.  You have blood in your sputum, urine, or stool. MAKE SURE YOU:  Understand these instructions.  Will watch your condition.  Will get help right away if you are not doing well or get worse. Document Released: 05/30/2005 Document Revised: 08/22/2011 Document Reviewed: 04/01/2011 [facility] Patient Information 2013 [place], [place].

## 2012-04-17 ENCOUNTER — Other Ambulatory Visit (HOSPITAL_BASED_OUTPATIENT_CLINIC_OR_DEPARTMENT_OTHER): Payer: Medicare Other

## 2012-04-17 DIAGNOSIS — C3490 Malignant neoplasm of unspecified part of unspecified bronchus or lung: Secondary | ICD-10-CM

## 2012-04-17 LAB — COMPREHENSIVE METABOLIC PANEL (CC13)
AST: 21 U/L (ref 5–34)
BUN: 18 mg/dL (ref 7.0–26.0)
Calcium: 10.4 mg/dL (ref 8.4–10.4)
Chloride: 108 mEq/L — ABNORMAL HIGH (ref 98–107)
Creatinine: 1.5 mg/dL — ABNORMAL HIGH (ref 0.7–1.3)
Total Bilirubin: 0.35 mg/dL (ref 0.20–1.20)

## 2012-04-17 LAB — CBC WITH DIFFERENTIAL/PLATELET
Basophils Absolute: 0 10*3/uL (ref 0.0–0.1)
EOS%: 1.5 % (ref 0.0–7.0)
HCT: 39.4 % (ref 38.4–49.9)
HGB: 13.8 g/dL (ref 13.0–17.1)
LYMPH%: 35.5 % (ref 14.0–49.0)
MCH: 36.3 pg — ABNORMAL HIGH (ref 27.2–33.4)
MCV: 103.9 fL — ABNORMAL HIGH (ref 79.3–98.0)
NEUT%: 54.1 % (ref 39.0–75.0)
Platelets: 160 10*3/uL (ref 140–400)
lymph#: 1.1 10*3/uL (ref 0.9–3.3)

## 2012-04-24 ENCOUNTER — Telehealth: Payer: Self-pay | Admitting: Internal Medicine

## 2012-04-24 ENCOUNTER — Ambulatory Visit (HOSPITAL_BASED_OUTPATIENT_CLINIC_OR_DEPARTMENT_OTHER): Payer: Medicare Other

## 2012-04-24 ENCOUNTER — Ambulatory Visit (HOSPITAL_BASED_OUTPATIENT_CLINIC_OR_DEPARTMENT_OTHER): Payer: Medicare Other | Admitting: Physician Assistant

## 2012-04-24 ENCOUNTER — Encounter: Payer: Self-pay | Admitting: Physician Assistant

## 2012-04-24 ENCOUNTER — Other Ambulatory Visit (HOSPITAL_BASED_OUTPATIENT_CLINIC_OR_DEPARTMENT_OTHER): Payer: Medicare Other | Admitting: Lab

## 2012-04-24 VITALS — BP 114/75 | HR 85 | Temp 97.3°F | Resp 20 | Ht 73.0 in | Wt 251.0 lb

## 2012-04-24 DIAGNOSIS — C3490 Malignant neoplasm of unspecified part of unspecified bronchus or lung: Secondary | ICD-10-CM

## 2012-04-24 DIAGNOSIS — Z5111 Encounter for antineoplastic chemotherapy: Secondary | ICD-10-CM

## 2012-04-24 DIAGNOSIS — C7A1 Malignant poorly differentiated neuroendocrine tumors: Secondary | ICD-10-CM

## 2012-04-24 LAB — CBC WITH DIFFERENTIAL/PLATELET
Basophils Absolute: 0 10*3/uL (ref 0.0–0.1)
HCT: 40 % (ref 38.4–49.9)
HGB: 14 g/dL (ref 13.0–17.1)
MCH: 35.3 pg — ABNORMAL HIGH (ref 27.2–33.4)
MONO#: 0.5 10*3/uL (ref 0.1–0.9)
NEUT%: 55.1 % (ref 39.0–75.0)
WBC: 3.2 10*3/uL — ABNORMAL LOW (ref 4.0–10.3)
lymph#: 0.9 10*3/uL (ref 0.9–3.3)

## 2012-04-24 LAB — COMPREHENSIVE METABOLIC PANEL (CC13)
ALT: 20 U/L (ref 0–55)
CO2: 28 mEq/L (ref 22–29)
Calcium: 10.4 mg/dL (ref 8.4–10.4)
Chloride: 108 mEq/L — ABNORMAL HIGH (ref 98–107)
Potassium: 4 mEq/L (ref 3.5–5.1)
Sodium: 139 mEq/L (ref 136–145)
Total Protein: 6.9 g/dL (ref 6.4–8.3)

## 2012-04-24 LAB — MAGNESIUM (CC13): Magnesium: 1.9 mg/dl (ref 1.5–2.5)

## 2012-04-24 MED ORDER — ATROPINE SULFATE 1 MG/ML IJ SOLN: 0.5000 mg | Freq: Once | INTRAMUSCULAR | Status: DC | PRN

## 2012-04-24 MED ORDER — FOSAPREPITANT DIMEGLUMINE INJECTION 150 MG
150.0000 mg | Freq: Once | INTRAVENOUS | Status: AC
  Administered 2012-04-24: 150 mg via INTRAVENOUS
  Filled 2012-04-24: qty 5

## 2012-04-24 MED ORDER — IRINOTECAN HCL CHEMO INJECTION 100 MG/5ML
66.0000 mg/m2 | Freq: Once | INTRAVENOUS | Status: AC
  Administered 2012-04-24: 160 mg via INTRAVENOUS
  Filled 2012-04-24: qty 8

## 2012-04-24 MED ORDER — SODIUM CHLORIDE 0.9 % IV SOLN
30.0000 mg/m2 | Freq: Once | INTRAVENOUS | Status: AC
  Administered 2012-04-24: 73 mg via INTRAVENOUS
  Filled 2012-04-24: qty 73

## 2012-04-24 MED ORDER — PALONOSETRON HCL INJECTION 0.25 MG/5ML
0.2500 mg | Freq: Once | INTRAVENOUS | Status: AC
  Administered 2012-04-24: 0.25 mg via INTRAVENOUS

## 2012-04-24 MED ORDER — POTASSIUM CHLORIDE 2 MEQ/ML IV SOLN
Freq: Once | INTRAVENOUS | Status: AC
  Administered 2012-04-24: 12:00:00 via INTRAVENOUS
  Filled 2012-04-24: qty 10

## 2012-04-24 MED ORDER — SODIUM CHLORIDE 0.9 % IV SOLN
Freq: Once | INTRAVENOUS | Status: AC
  Administered 2012-04-24: 12:00:00 via INTRAVENOUS

## 2012-04-24 MED ORDER — DEXAMETHASONE SODIUM PHOSPHATE 4 MG/ML IJ SOLN
12.0000 mg | Freq: Once | INTRAMUSCULAR | Status: AC
  Administered 2012-04-24: 12 mg via INTRAVENOUS

## 2012-04-24 NOTE — Telephone Encounter (Signed)
appts made and printed for pt , Pt aware that tx will follow on 12/3 and 12/10 email to mw to add tx

## 2012-04-24 NOTE — Patient Instructions (Addendum)
Continue with weekly labs Follow up in 3 weeks prior to your next cycle of chemotherapy 

## 2012-05-01 ENCOUNTER — Other Ambulatory Visit (HOSPITAL_BASED_OUTPATIENT_CLINIC_OR_DEPARTMENT_OTHER): Payer: Medicare Other

## 2012-05-01 ENCOUNTER — Ambulatory Visit (HOSPITAL_BASED_OUTPATIENT_CLINIC_OR_DEPARTMENT_OTHER): Payer: Medicare Other

## 2012-05-01 VITALS — BP 104/66 | HR 77 | Temp 98.3°F | Resp 18

## 2012-05-01 DIAGNOSIS — C7A1 Malignant poorly differentiated neuroendocrine tumors: Secondary | ICD-10-CM

## 2012-05-01 DIAGNOSIS — C3490 Malignant neoplasm of unspecified part of unspecified bronchus or lung: Secondary | ICD-10-CM

## 2012-05-01 DIAGNOSIS — Z5111 Encounter for antineoplastic chemotherapy: Secondary | ICD-10-CM

## 2012-05-01 LAB — COMPREHENSIVE METABOLIC PANEL (CC13)
Albumin: 3.5 g/dL (ref 3.5–5.0)
BUN: 31 mg/dL — ABNORMAL HIGH (ref 7.0–26.0)
CO2: 24 mEq/L (ref 22–29)
Calcium: 10.3 mg/dL (ref 8.4–10.4)
Chloride: 109 mEq/L — ABNORMAL HIGH (ref 98–107)
Glucose: 164 mg/dl — ABNORMAL HIGH (ref 70–99)
Potassium: 4.5 mEq/L (ref 3.5–5.1)
Sodium: 140 mEq/L (ref 136–145)
Total Protein: 6.6 g/dL (ref 6.4–8.3)

## 2012-05-01 LAB — CBC WITH DIFFERENTIAL/PLATELET
Basophils Absolute: 0 10*3/uL (ref 0.0–0.1)
EOS%: 0.3 % (ref 0.0–7.0)
Eosinophils Absolute: 0 10*3/uL (ref 0.0–0.5)
HCT: 38.8 % (ref 38.4–49.9)
HGB: 13.5 g/dL (ref 13.0–17.1)
MCH: 35 pg — ABNORMAL HIGH (ref 27.2–33.4)
MONO#: 0.4 10*3/uL (ref 0.1–0.9)
NEUT#: 2.2 10*3/uL (ref 1.5–6.5)
NEUT%: 55.3 % (ref 39.0–75.0)
lymph#: 1.3 10*3/uL (ref 0.9–3.3)

## 2012-05-01 MED ORDER — SODIUM CHLORIDE 0.9 % IV SOLN
30.0000 mg/m2 | Freq: Once | INTRAVENOUS | Status: AC
  Administered 2012-05-01: 73 mg via INTRAVENOUS
  Filled 2012-05-01: qty 73

## 2012-05-01 MED ORDER — SODIUM CHLORIDE 0.9 % IV SOLN
150.0000 mg | Freq: Once | INTRAVENOUS | Status: AC
  Administered 2012-05-01: 150 mg via INTRAVENOUS
  Filled 2012-05-01: qty 5

## 2012-05-01 MED ORDER — SODIUM CHLORIDE 0.9 % IV SOLN
Freq: Once | INTRAVENOUS | Status: AC
  Administered 2012-05-01: 10:00:00 via INTRAVENOUS

## 2012-05-01 MED ORDER — PALONOSETRON HCL INJECTION 0.25 MG/5ML
0.2500 mg | Freq: Once | INTRAVENOUS | Status: AC
  Administered 2012-05-01: 0.25 mg via INTRAVENOUS

## 2012-05-01 MED ORDER — POTASSIUM CHLORIDE 2 MEQ/ML IV SOLN
Freq: Once | INTRAVENOUS | Status: AC
  Administered 2012-05-01: 10:00:00 via INTRAVENOUS
  Filled 2012-05-01: qty 10

## 2012-05-01 MED ORDER — DEXAMETHASONE SODIUM PHOSPHATE 4 MG/ML IJ SOLN: 12.0000 mg | Freq: Once | INTRAMUSCULAR | Status: DC

## 2012-05-01 MED ORDER — IRINOTECAN HCL CHEMO INJECTION 100 MG/5ML
160.0000 mg | Freq: Once | INTRAVENOUS | Status: AC
  Administered 2012-05-01: 160 mg via INTRAVENOUS
  Filled 2012-05-01: qty 8

## 2012-05-01 NOTE — Progress Notes (Signed)
Pt in for chemo today.  Upon assessment RN noticed that patient seemed a bit slow in his responses and appeared to be lethargic.  Dr. Arbutus Ped aware and came to speak with patient.  Ok to proceed with treatment according to Dr. Arbutus Ped but we are to maintain close observation on patient.  Pt's blood pressure is noted to be low and pt did state he took his blood pressure meds this morning.  Dr. Arbutus Ped aware.

## 2012-05-01 NOTE — Patient Instructions (Addendum)
[facility] Discharge Instructions for Patients Receiving Chemotherapy  Today you received the following chemotherapy agents cisplatin, camptosar  To help prevent nausea and vomiting after your treatment, we encourage you to take your nausea medication as prescribed Begin taking it as directed and take it as often as prescribed for the next 72  hours.   If you develop nausea and vomiting that is not controlled by your nausea medication, call the clinic. If it is after clinic hours your family physician or the after hours number for the clinic or go to the Emergency Department.   BELOW ARE SYMPTOMS THAT SHOULD BE REPORTED IMMEDIATELY:  *FEVER GREATER THAN 100.5 F  *CHILLS WITH OR WITHOUT FEVER  NAUSEA AND VOMITING THAT IS NOT CONTROLLED WITH YOUR NAUSEA MEDICATION  *UNUSUAL SHORTNESS OF BREATH  *UNUSUAL BRUISING OR BLEEDING  TENDERNESS IN MOUTH AND THROAT WITH OR WITHOUT PRESENCE OF ULCERS  *URINARY PROBLEMS  *BOWEL PROBLEMS  UNUSUAL RASH Items with * indicate a potential emergency and should be followed up as soon as possible.  One of the nurses will contact you 24 hours after your treatment. Please let the nurse know about any problems that you may have experienced. Feel free to call the clinic you have any questions or concerns. The clinic phone number is [phone].   I have been informed and understand all the instructions given to me. I know to contact the clinic, my physician, or go to the Emergency Department if any problems should occur. I do not have any questions at this time, but understand that I may call the clinic during office hours   should I have any questions or need assistance in obtaining follow up care.    __________________________________________  _____________  __________ Signature of Patient or Authorized Representative            Date                   Time    __________________________________________ Nurse's Signature

## 2012-05-01 NOTE — Progress Notes (Signed)
[facility] [phone]   [phone]  OFFICE PROGRESS NOTE  [name], MD [phone] [place] [address]  DIAGNOSIS: Extensive stage small cell lung cancer diagnosed in August of 2013.  PRIOR THERAPY: Systemic chemotherapy in the form of carboplatin for an AUC of 5 given on day 1 and etoposide 100 mg per meter squared given on days 1, 2 and 3 with Neulasta support given on day 4. Status post 2 cycle, discontinued today secondary to disease progression   CURRENT THERAPY:  Systemic chemotherapy with cisplatin 60 mg/M2 and irinotecan 65 mg/M2 on days 1 and 8 every 3 weeks. Status post 1 cycle.   INTERVAL HISTORY: Albin Duckett 71 y.o. male returns to the clinic today for followup visit. Today he voices no specific complaints.  He denied having any significant weight loss or night sweats. He has no nausea or vomiting. No fever or chills. The patient denied having any significant chest pain but continues to have shortness breath with exertion, no cough or hemoptysis.  MEDICAL HISTORY: Past Medical History  Diagnosis Date  . Hypertension   . ALLERGIC RHINITIS   . Malignant neoplasm of bronchus and lung, unspecified site 01/23/2012    ALLERGIES:   has no known allergies.  MEDICATIONS:  Current Outpatient Prescriptions  Medication Sig Dispense Refill  . doxazosin (CARDURA) 8 MG tablet Take 8 mg by mouth at bedtime.      Marland Kitchen HYDROcodone-acetaminophen (VICODIN) 5-500 MG per tablet 1/2 to 1 tablet by mouth every 6 hours as needed for pain  30 tablet  0  . olmesartan-hydrochlorothiazide (BENICAR HCT) 40-12.5 MG per tablet Take 1 tablet by mouth daily. Takes 1/2 tablet by mouth daily      . prochlorperazine (COMPAZINE) 10 MG tablet Take 1 tablet (10 mg total) by mouth every 6 (six) hours as needed.  30 tablet  0   No current facility-administered medications for this visit.   Facility-Administered Medications Ordered in Other Visits    Medication Dose Route Frequency Provider Last Rate Last Dose  . [COMPLETED] 0.9 %  sodium chloride infusion   Intravenous Once Conni Slipper, PA 20 mL/hr at 05/01/12 0950    . CISplatin (PLATINOL) 73 mg in sodium chloride 0.9 % 250 mL chemo infusion  30 mg/m2 (Treatment Plan Actual) Intravenous Once Conni Slipper, PA      . dexamethasone (DECADRON) injection 12 mg  12 mg Intravenous Once [employer], PA      . [COMPLETED] dextrose 5 % and 0.45% NaCl 1,000 mL with potassium chloride 20 mEq, magnesium sulfate 12 mEq infusion   Intravenous Once Conni Slipper, PA 250 mL/hr at 05/01/12 0959    . [COMPLETED] fosaprepitant (EMEND) 150 mg in sodium chloride 0.9 % 145 mL IVPB  150 mg Intravenous Once Conni Slipper, PA 300 mL/hr at 05/01/12 1319 150 mg at 05/01/12 1319  . irinotecan (CAMPTOSAR) 160 mg in dextrose 5 % 500 mL chemo infusion  160 mg Intravenous Once Conni Slipper, PA 339 mL/hr at 05/01/12 1404 160 mg at 05/01/12 1404  . [COMPLETED] palonosetron (ALOXI) injection 0.25 mg  0.25 mg Intravenous Once Conni Slipper, PA   0.25 mg at 05/01/12 1316    SURGICAL HISTORY:  Past Surgical History  Procedure Date  . Wrist surgery 1961    right wrist    REVIEW OF SYSTEMS:  A comprehensive review of systems was negative.   PHYSICAL EXAMINATION:  General appearance: alert, cooperative and no distress Head: Normocephalic, without obvious abnormality, atraumatic Neck: no adenopathy Lymph nodes: Cervical, supraclavicular, and axillary nodes normal. Resp: clear to auscultation bilaterally Cardio: regular rate and rhythm, S1, S2 normal, no murmur, click, rub or gallop GI: soft, non-tender; bowel sounds normal; no masses,  no organomegaly Extremities: extremities normal, atraumatic, no cyanosis or edema Neurologic: Alert and oriented X 3, normal strength and tone. Normal symmetric reflexes. Normal coordination and gait  ECOG PERFORMANCE STATUS: 1 - Symptomatic but completely  ambulatory  Blood pressure 114/75, pulse 85, temperature 97.3 F (36.3 C), temperature source Oral, resp. rate 20, height 6\' 1"  (1.854 m), weight 251 lb (113.853 kg).  LABORATORY DATA: Lab Results  Component Value Date   WBC 3.9* 05/01/2012   HGB 13.5 05/01/2012   HCT 38.8 05/01/2012   MCV 100.5* 05/01/2012   PLT 152 05/01/2012      Chemistry      Component Value Date/Time   NA 140 05/01/2012 0921   NA 139 01/31/2012 1308   K 4.5 05/01/2012 0921   K 4.0 01/31/2012 1308   CL 109* 05/01/2012 0921   CL 107 01/31/2012 1308   CO2 24 05/01/2012 0921   CO2 25 01/31/2012 1308   BUN 31.0* 05/01/2012 0921   BUN 17 01/31/2012 1308   CREATININE 1.7* 05/01/2012 0921   CREATININE 1.34 01/31/2012 1308      Component Value Date/Time   CALCIUM 10.3 05/01/2012 0921   CALCIUM 10.0 01/31/2012 1308   ALKPHOS 71 05/01/2012 0921   ALKPHOS 55 01/31/2012 1308   AST 23 05/01/2012 0921   AST 20 01/31/2012 1308   ALT 22 05/01/2012 0921   ALT 17 01/31/2012 1308   BILITOT 0.40 05/01/2012 0921   BILITOT 0.6 01/31/2012 1308       RADIOGRAPHIC STUDIES: Ct Chest W Contrast  03/09/2012  *RADIOLOGY REPORT*  Clinical Data:  Recently diagnosed lung cancer currently undergoing chemotherapy.  Cough.  Restaging scan.  CT CHEST, ABDOMEN AND PELVIS WITH CONTRAST  Technique:  Multidetector CT imaging of the chest, abdomen and pelvis was performed following the standard protocol during bolus administration of intravenous contrast.  Contrast: OMNIPAQUE IOHEXOL 300 MG/ML  SOLN  Comparison:  Chest abdomen and pelvis CT 01/10/2012.  PET CT 01/27/2012.  CT CHEST  Findings:  Mediastinum: Heart size is mildly enlarged. There is no significant pericardial fluid, thickening or pericardial calcification. There is atherosclerosis of the thoracic aorta, the great vessels of the mediastinum and the coronary arteries, including calcified atherosclerotic plaque in the left main, left anterior descending, left circumflex and right  coronary arteries. Calcifications of the aortic valve. Infrahilar lymph nodes in the right lower lobe measuring 1.7 cm in short axis have increased slightly in size compared the prior examination.  However, previously noted subcarinal nodal mass is smaller, currently measuring 1.6 cm in short axis (previously 2.2 cm).  3.2 x 1.6 cm sessile low attenuation lesion immediately lateral to the superior cavoatrial junction and is similar to prior examinations, and favored to represent a pericardial cyst.  Esophagus is unremarkable in appearance.  Lungs/Pleura: Compared to the prior examination, the macrolobulated right lower lobe mass appears to have slightly increased in size, currently measuring 3.3 x 2.7 cm (previously 3.2 x 2.3 cm).  There are distal postobstructive changes in the periphery of the right lower lobe, with increasing peripheral nodularity in the right lower lobe, the main portion of the which currently measures up to 2.9 x 1.7 cm on  image 38 of series 2.  It is uncertain whether not this simply reflects postobstructive pneumonia, or whether not this is indicative of lymphangitic spread of disease with further right lower lobe metastases.  The 1.3 x 1.0 cm right lower lobe pulmonary nodule (image 44 of series 4) has slightly decreased in size.  9 mm left lower lobe nodule (image 46 of series 4) is unchanged in size. 9 mm pleural based nodule (image 30 of series 2) has slightly increased in size (previously 7 mm).  There are patchy areas of subpleural reticulation and some scattered areas of frank honeycombing in the periphery of the lungs bilaterally, similar to the prior study, suggesting either areas of post infectious/inflammatory scarring, or an underlying interstitial lung disease.  Trace right-sided pleural effusion layering dependently.  Musculoskeletal: There are no aggressive appearing lytic or blastic lesions noted in the visualized portions of the skeleton. Bilateral gynecomastia (right greater  than left).  IMPRESSION:  1.  Today's study demonstrates a mixed response to therapy. Specifically, while there has been decrease in size of subcarinal lymphadenopathy, the right hilar adenopathy, primary right lower lobe mass and other smaller pulmonary nodules in the lungs bilaterally have generally increased compared to the prior examination, as detailed above. Accordingly, the overall appearance is most suggestive of progression of disease. 2. Atherosclerosis, including left main and three-vessel coronary artery disease. 3.  Mild cardiomegaly is unchanged. 4.  Smoothly marginated low attenuation lesion adjacent to the superior cavoatrial junction is favored to represent a small pericardial cyst and is similar to priors. 5.  Gynecomastia.   CT ABDOMEN AND PELVIS  Findings:  Abdomen/Pelvis: The appearance of the liver, gallbladder, pancreas, spleen and bilateral adrenal glands is unremarkable.  Numerous nonenhancing well-defined low attenuation lesions in the kidneys bilaterally are similar to prior examinations and compatible with simple cyst.  There are other subcentimeter low attenuation lesions in the kidneys bilaterally which are too small to definitively characterize, but are also similar, and are also favored to represent small cystic lesions.  Extensive atherosclerosis of the abdominal and pelvic vasculature, without definite aneurysm, dissection or major branch vessel occlusion.  Normal appendix.  No ascites or pneumoperitoneum and no pathologic distension of bowel. No definite pathologic lymphadenopathy identified within the abdomen or pelvis.  Prostate is mildly enlarged.  Urinary bladder is unremarkable in appearance.  Musculoskeletal: There are no aggressive appearing lytic or blastic lesions noted in the visualized portions of the skeleton.  IMPRESSION:  1.  No findings to suggest metastatic disease to the abdomen or pelvis on today's examination. 2.  Multiple bilateral renal cysts appear similar to  prior examinations. 3.  Extensive atherosclerosis. 4.  Prostatomegaly.   Original Report Authenticated By: Florencia Reasons, M.D.      ASSESSMENT/PLAN: This is a very pleasant 71 years old African American male with extensive stage small cell lung cancer status post 2 cycles of systemic chemotherapy with carboplatin and etoposide but unfortunately he continues to have some evidence for disease progression which is very unusual behavior for small cell lung cancer especially with the first line treatment. He is now being treated with cisplatin and irinotecan, status post 1 cycle. The patient was discussed with Dr. Arbutus Ped. He will continue with labs as per standing orders. He'll return in 3 weeks prior to cycle #3 of his systemic chemotherapy with cisplatin and irinotecan.   Laural Benes, Juddson Cobern E, PA-C   All questions were answered. The patient knows to call the clinic with any problems, questions or  concerns. We can certainly see the patient much sooner if necessary.  I spent 20 minutes counseling the patient face to face. The total time spent in the appointment was 30 minutes.

## 2012-05-05 ENCOUNTER — Emergency Department (HOSPITAL_COMMUNITY): Payer: Medicare Other

## 2012-05-05 ENCOUNTER — Inpatient Hospital Stay (HOSPITAL_COMMUNITY): Payer: Medicare Other

## 2012-05-05 ENCOUNTER — Encounter (HOSPITAL_COMMUNITY): Payer: Self-pay | Admitting: Emergency Medicine

## 2012-05-05 ENCOUNTER — Inpatient Hospital Stay (HOSPITAL_COMMUNITY)
Admission: EM | Admit: 2012-05-05 | Discharge: 2012-05-09 | DRG: 982 | Disposition: A | Discharge status: Rehabilitation Facility | Payer: Medicare Other | Attending: Internal Medicine | Admitting: Internal Medicine

## 2012-05-05 DIAGNOSIS — S82899A Other fracture of unspecified lower leg, initial encounter for closed fracture: Secondary | ICD-10-CM | POA: Diagnosis present

## 2012-05-05 DIAGNOSIS — I10 Essential (primary) hypertension: Secondary | ICD-10-CM

## 2012-05-05 DIAGNOSIS — I951 Orthostatic hypotension: Principal | ICD-10-CM | POA: Diagnosis present

## 2012-05-05 DIAGNOSIS — R55 Syncope and collapse: Secondary | ICD-10-CM | POA: Diagnosis present

## 2012-05-05 DIAGNOSIS — S82891A Other fracture of right lower leg, initial encounter for closed fracture: Secondary | ICD-10-CM

## 2012-05-05 DIAGNOSIS — Y92009 Unspecified place in unspecified non-institutional (private) residence as the place of occurrence of the external cause: Secondary | ICD-10-CM

## 2012-05-05 DIAGNOSIS — R5381 Other malaise: Secondary | ICD-10-CM | POA: Diagnosis present

## 2012-05-05 DIAGNOSIS — D696 Thrombocytopenia, unspecified: Secondary | ICD-10-CM | POA: Diagnosis present

## 2012-05-05 DIAGNOSIS — M25579 Pain in unspecified ankle and joints of unspecified foot: Secondary | ICD-10-CM

## 2012-05-05 DIAGNOSIS — M25571 Pain in right ankle and joints of right foot: Secondary | ICD-10-CM | POA: Diagnosis present

## 2012-05-05 DIAGNOSIS — K219 Gastro-esophageal reflux disease without esophagitis: Secondary | ICD-10-CM | POA: Diagnosis present

## 2012-05-05 DIAGNOSIS — I5032 Chronic diastolic (congestive) heart failure: Secondary | ICD-10-CM

## 2012-05-05 DIAGNOSIS — C3490 Malignant neoplasm of unspecified part of unspecified bronchus or lung: Secondary | ICD-10-CM | POA: Diagnosis present

## 2012-05-05 DIAGNOSIS — R011 Cardiac murmur, unspecified: Secondary | ICD-10-CM

## 2012-05-05 DIAGNOSIS — I129 Hypertensive chronic kidney disease with stage 1 through stage 4 chronic kidney disease, or unspecified chronic kidney disease: Secondary | ICD-10-CM | POA: Diagnosis present

## 2012-05-05 DIAGNOSIS — R918 Other nonspecific abnormal finding of lung field: Secondary | ICD-10-CM

## 2012-05-05 DIAGNOSIS — D6959 Other secondary thrombocytopenia: Secondary | ICD-10-CM | POA: Diagnosis present

## 2012-05-05 DIAGNOSIS — W010XXA Fall on same level from slipping, tripping and stumbling without subsequent striking against object, initial encounter: Secondary | ICD-10-CM | POA: Diagnosis present

## 2012-05-05 DIAGNOSIS — N179 Acute kidney failure, unspecified: Secondary | ICD-10-CM | POA: Diagnosis present

## 2012-05-05 DIAGNOSIS — I5030 Unspecified diastolic (congestive) heart failure: Secondary | ICD-10-CM | POA: Diagnosis present

## 2012-05-05 DIAGNOSIS — N183 Chronic kidney disease, stage 3 unspecified: Secondary | ICD-10-CM | POA: Diagnosis present

## 2012-05-05 DIAGNOSIS — T451X5A Adverse effect of antineoplastic and immunosuppressive drugs, initial encounter: Secondary | ICD-10-CM | POA: Diagnosis present

## 2012-05-05 LAB — PROTIME-INR: Prothrombin Time: 13.5 seconds (ref 11.6–15.2)

## 2012-05-05 LAB — COMPREHENSIVE METABOLIC PANEL
ALT: 16 U/L (ref 0–53)
Albumin: 3.5 g/dL (ref 3.5–5.2)
BUN: 38 mg/dL — ABNORMAL HIGH (ref 6–23)
Calcium: 10.5 mg/dL (ref 8.4–10.5)
GFR calc Af Amer: 47 mL/min — ABNORMAL LOW (ref >90)
Glucose, Bld: 109 mg/dL — ABNORMAL HIGH (ref 70–99)
Sodium: 136 mEq/L (ref 135–145)
Total Protein: 6.9 g/dL (ref 6.0–8.3)

## 2012-05-05 LAB — CBC
Hemoglobin: 13.4 g/dL (ref 13.0–17.0)
MCH: 35.2 pg — ABNORMAL HIGH (ref 26.0–34.0)
MCHC: 35.4 g/dL (ref 30.0–36.0)
RDW: 14.9 % (ref 11.5–15.5)

## 2012-05-05 LAB — URINALYSIS, ROUTINE W REFLEX MICROSCOPIC
Bilirubin Urine: NEGATIVE
Hgb urine dipstick: NEGATIVE
Nitrite: NEGATIVE
Protein, ur: NEGATIVE mg/dL
Specific Gravity, Urine: 1.019 (ref 1.005–1.030)
Urobilinogen, UA: 0.2 mg/dL (ref 0.0–1.0)

## 2012-05-05 LAB — MAGNESIUM: Magnesium: 1.8 mg/dL (ref 1.5–2.5)

## 2012-05-05 LAB — TROPONIN I
Troponin I: <0.3 ng/mL (ref <0.30)
Troponin I: <0.3 ng/mL (ref <0.30)

## 2012-05-05 LAB — HEPATIC FUNCTION PANEL
ALT: 17 U/L (ref 0–53)
AST: 18 U/L (ref 0–37)
Bilirubin, Direct: 0.1 mg/dL (ref 0.0–0.3)
Indirect Bilirubin: 0.4 mg/dL (ref 0.3–0.9)
Total Bilirubin: 0.5 mg/dL (ref 0.3–1.2)

## 2012-05-05 LAB — PHOSPHORUS: Phosphorus: 3.5 mg/dL (ref 2.3–4.6)

## 2012-05-05 LAB — VITAMIN B12: Vitamin B-12: 1031 pg/mL — ABNORMAL HIGH (ref 211–911)

## 2012-05-05 MED ORDER — ACETAMINOPHEN 650 MG RE SUPP: 650.0000 mg | Freq: Four times a day (QID) | RECTAL | Status: DC | PRN

## 2012-05-05 MED ORDER — ACETAMINOPHEN 325 MG PO TABS
650.0000 mg | ORAL_TABLET | Freq: Four times a day (QID) | ORAL | Status: DC | PRN
  Administered 2012-05-08 – 2012-05-09 (×2): 650 mg via ORAL
  Filled 2012-05-05 (×2): qty 2

## 2012-05-05 MED ORDER — VITAMIN B-12 1000 MCG PO TABS
1000.0000 ug | ORAL_TABLET | Freq: Every day | ORAL | Status: DC
  Administered 2012-05-06 – 2012-05-09 (×4): 1000 ug via ORAL
  Filled 2012-05-05 (×4): qty 1

## 2012-05-05 MED ORDER — SODIUM CHLORIDE 0.9 % IV SOLN
INTRAVENOUS | Status: AC
  Administered 2012-05-05: 16:00:00 via INTRAVENOUS

## 2012-05-05 MED ORDER — FOLIC ACID 1 MG PO TABS
1.0000 mg | ORAL_TABLET | Freq: Every day | ORAL | Status: DC
  Administered 2012-05-06 – 2012-05-09 (×4): 1 mg via ORAL
  Filled 2012-05-05 (×4): qty 1

## 2012-05-05 MED ORDER — OXYCODONE HCL 5 MG PO TABS
5.0000 mg | ORAL_TABLET | Freq: Four times a day (QID) | ORAL | Status: DC | PRN
  Administered 2012-05-05 – 2012-05-09 (×11): 5 mg via ORAL
  Filled 2012-05-05 (×11): qty 1

## 2012-05-05 MED ORDER — SODIUM CHLORIDE 0.9 % IV SOLN: INTRAVENOUS | Status: AC

## 2012-05-05 MED ORDER — ONDANSETRON HCL 4 MG/2ML IJ SOLN: 4.0000 mg | Freq: Four times a day (QID) | INTRAMUSCULAR | Status: DC | PRN

## 2012-05-05 MED ORDER — PANTOPRAZOLE SODIUM 40 MG PO TBEC
40.0000 mg | DELAYED_RELEASE_TABLET | Freq: Every day | ORAL | Status: DC
  Administered 2012-05-06 – 2012-05-09 (×4): 40 mg via ORAL
  Filled 2012-05-05 (×4): qty 1

## 2012-05-05 MED ORDER — SODIUM CHLORIDE 0.9 % IJ SOLN
3.0000 mL | Freq: Two times a day (BID) | INTRAMUSCULAR | Status: DC
  Administered 2012-05-06 – 2012-05-09 (×4): 3 mL via INTRAVENOUS

## 2012-05-05 MED ORDER — HYDRALAZINE HCL 20 MG/ML IJ SOLN
10.0000 mg | Freq: Four times a day (QID) | INTRAMUSCULAR | Status: DC | PRN
  Filled 2012-05-05: qty 0.5

## 2012-05-05 MED ORDER — ONDANSETRON HCL 4 MG PO TABS: 4.0000 mg | ORAL_TABLET | Freq: Four times a day (QID) | ORAL | Status: DC | PRN

## 2012-05-05 NOTE — H&P (Signed)
[facility] History and Physical  [name] ZOX:[id number] DOB: 12-17-1940 DOA: 05/05/2012  Referring physician: Dr. Lorenso Courier PCP: [name], MD  Specialists: Dr. Arbutus Ped (oncologist)  Chief Complaint: syncope and collapse  HPI: [name] is a 71 y.o. 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 Patient reports that since his last chemotherapy treatment on 05/01/12 he has been no eating or drinking much, and also reports that his blood pressure has been softer than usual. He denies any chest pain, shortness of breath, cough, fever/chills, nausea/vomiting, headache, vision changes or palpitations. Patient reports that he was feeling moody whoozy/lightheaded prior to experiencing the event. Syncope was witnessed by home health aide and also by his wife which were able to call 911 right away and assist the patient. He never hit his head during the fall and the event was transient (no post ictal). Patient endorses right ankle pain after the syncope episode and reports that he has twisted his ankle during the syncope event. [facility] has been called to admit for further evaluation and tx. Patient ankle x-ray demonstrated mild displace fibula fracture (ortho has been called to provide further recommendations)  Review of Systems:  Negative except as mentioned on HPI.  Past Medical History  Diagnosis Date  . Hypertension   . ALLERGIC RHINITIS   . Malignant neoplasm of bronchus and lung, unspecified site 01/23/2012   Past Surgical History  Procedure Date  . Wrist surgery 1961    right wrist   Social History:  reports that he has been smoking Cigarettes.  He has a 12.5 pack-year smoking history. He has never used smokeless tobacco. He reports that he does not drink alcohol or use illicit drugs. From home. Reports no needs for ADL's; but  endorses he has been really weak lately after been on chemotherapy.   No Known Allergies  Family History  Problem Relation Age of Onset  . Hypertension Sister   . Hypertension Brother   . Hypertension Mother   . Coronary artery disease Mother     Prior to Admission medications   Medication Sig Start Date End Date Taking? Authorizing Provider  HYDROcodone-acetaminophen (VICODIN) 5-500 MG per tablet 1/2 to 1 tablet by mouth every 6 hours as needed for pain 03/27/12  Yes Conni Slipper, PA  olmesartan-hydrochlorothiazide (BENICAR HCT) 40-12.5 MG per tablet Take 0.5 tablets by mouth daily.    Yes Historical Provider, MD  PRESCRIPTION MEDICATION Inject into the vein once a week. Emend, Aloxi, Platinol and Camptosar infusions every Tuesday.   Yes Historical Provider, MD  prochlorperazine (COMPAZINE) 10 MG tablet Take 10 mg by mouth every 6 (six) hours as needed. For nausea.   Yes Historical Provider, MD  vitamin B-12 (CYANOCOBALAMIN) 1000 MCG tablet Take 1,000 mcg by mouth daily.   Yes Historical Provider, MD   Physical Exam: Filed Vitals:   05/05/12 1300 05/05/12 1302 05/05/12 1330 05/05/12 1535  BP: 113/66 130/56 124/76 142/79  Pulse: 83 92  90  Temp:      TempSrc:      Resp:   20 16  Height:      Weight:      SpO2:   98% 100%     General: NAD, afebrile, AAOX3; cooperative to examination.  Eyes: no icterus, no nystagmus, PERRLA, EOMI  ENT: no erythema, no exudates inside his mouth  Neck: no JVD, no bruits  Cardiovascular: positive SEM,  no rubs or gallops; regular rate with multiple extrasystole  Respiratory: CTA  Abdomen: soft, NT, positive BS  Skin: no rash, no petechiae  Musculoskeletal: Right ankle swollen and tender to palpation  Psychiatric: appropriate  Neurologic: AAOX3,CN intact, no focal motor or sensory deficit.  Labs on Admission:  Basic Metabolic Panel:  Lab 05/05/12 9604 05/01/12 0921  NA 136 140  K 4.6 4.5  CL 102 109*  CO2 27 24  GLUCOSE  109* 164*  BUN 38* 31.0*  CREATININE 1.63* 1.7*  CALCIUM 10.5 10.3  MG -- 1.6  PHOS -- --   Liver Function Tests:  Lab 05/05/12 1330 05/01/12 0921  AST 18 23  ALT 16 22  ALKPHOS 66 71  BILITOT 0.4 0.40  PROT 6.9 6.6  ALBUMIN 3.5 3.5   CBC:  Lab 05/05/12 1330 05/01/12 0920  WBC 4.8 3.9*  NEUTROABS -- 2.2  HGB 13.4 13.5  HCT 37.9* 38.8  MCV 99.5 100.5*  PLT 139* 152   Cardiac Enzymes:  Lab 05/05/12 1330  CKTOTAL --  CKMB --  CKMBINDEX --  TROPONINI <0.30    Radiological Exams on Admission: Dg Chest 2 View  05/05/2012  *RADIOLOGY REPORT*  Clinical Data: Lung cancer, syncope, weakness  CHEST - 2 VIEW  Comparison: 03/09/2012, 01/27/2012, 01/18/2012  Findings: Nodular opacities in the right lower lobe noted compatible with known lung cancer.  Compared to 01/18/2012, there is slight improvement in the right lower lobe aeration compatible with resolving pneumonia or postobstructive process.  No large effusion or pneumothorax.  Left lung remains relatively clear. Stable heart size and vascularity.  Trachea is midline.  Slight pleural thickening on the right, stable.  IMPRESSION: Right lower lobe nodular opacities compatible with known lung cancer.  Improving right lower lobe aeration compatible with resolving pneumonia/post obstructive pneumonitis.   Original Report Authenticated By: Judie Petit. Shick, M.D.    Dg Ankle Complete Right  05/05/2012  *RADIOLOGY REPORT*  Clinical Data: Fall with right ankle injury.  RIGHT ANKLE - COMPLETE 3+ VIEW  Comparison: None.  Findings: Mildly displaced oblique fracture is present through the distal fibula that extends into the ankle mortise.  There is associated mild subluxation of the talar dome laterally with widening of the medial mortise.  In the lateral projection, there is potentially a subtle nondisplaced posterior malleolar fracture. However, this may be overlap of the lateral malleolar fracture rather than a separate fracture.  IMPRESSION: Mildly  displaced fracture of the distal fibula with associated mild talar subluxation laterally.  There may also be a subtle component of a posterior malleolar fracture.   Original Report Authenticated By: Irish Lack, M.D.     EKG:  Rate: 88 bpm  Rhythm: sinus with ventricular trigeminy  QRS Axis: normal  Intervals: normal  ST/T Wave abnormalities: normal  Conduction Disutrbances:none  Narrative Interpretation: noted a sinus rhythm with bigeminy (PVCs) on 01/18/12; no significant changes from past EKG.  Assessment/Plan 1-Syncope and collapse: Patient without orthostatics this at the moment of presentation to the ER, but reports that he has not been eating and drinking much since his last chemotherapy treatment on 05/01/2012; patient also endorses having blood pressure fluctuating since his last chemotherapy treatment as well (running on the soft side). He denies any chest pain, shortness of breath, palpitations, lightheadedness or any other symptoms prior to syncope event. There was no seizure activity prior, during or after the syncope and there has not been any focal neurologic complaints. -Will admit to telemetry; magnesium and phosphorus level -  Due to positive heart murmur on exam (new for the patient); Will get a 2-D echo -Cycle cardiac enzymes, check TSH, B12 level and a urinalysis. -Provide gentle fluid resuscitation  -Hall antihypertensive agents (especially diuretics). -Will check cortisol level -Repeat EKG and orthostatic vital signs in the morning.  2-Small cell lung carcinoma: Patient receiving active chemotherapy treatment at this moment. Dr. Shirline Frees who is his cancer doctor has been notified of the patient's admission. Will follow recommendations.  3-HTN (hypertension): Soft on the moment of admission. Will hold Benicar. Will monitor vital signs.  4-CKD (chronic kidney disease) stage 3, GFR 30-59 ml/min: Creatinine 1.6; appears to be at baseline for him. Will follow kidney  function. Gentle hydration and hold ARB's/HCTZ for now.  5-GERD: Continue PPI.  6-Thrombocytopenia: No signs of overt bleeding. Most likely secondary to ongoing chemotherapy. Avoid heparin products. Will use SCDs for DVT prophylaxis.  7-Heart murmur: Appears to be aortic stenosis per physical exam; but will check 2-D echo for better evaluation.  8-Right ankle pain: Patient reports that he twisted his ankle during the fall at the moment that he had the syncope event. Ankle x-ray demonstrating Mildly displaced fracture of the distal fibula with associated mild talar subluxation laterally. Will place splint, no weight bearing and use PRN pain meds. Orthopedic service has been contacted (Dr. Ave Filter) and patient will most likely required surgery by their impression on x-ray. Will follow further recommendations.  9-Weakness: multifactorial; and worsen with chemotherapy. Will ask PT and OT to see him    DVT: SCD's  Code Status: Full code Family Communication: No family at bedside.  Disposition Plan: Will admit to the hospital telemetry bed for further evaluation and treatment of her syncope episode. Patient's symptoms and history provide concerns for arrhythmias; depending workup will require cardiology inputs.  Time spent: > 30 minutes  Lamara Brecht [facility] Pager [phone]  If 7PM-7AM, please contact night-coverage [email or web address] Password [facility] 05/05/2012, 4:32 PM

## 2012-05-05 NOTE — ED Notes (Signed)
Per EMS, pt loss consciousness and slid down the wall. Pt did not hit his head.  Pt is unable to remember the incident, witnessed by family and home health aide. Pt currently undergoing chemo for a month, last round Tuesday.  Pt's BP has been dropping since then.  Pt denies pain. Pitting edema noted. A fib on monitor with multifocal PVCs.

## 2012-05-05 NOTE — Progress Notes (Signed)
Orthopedic Tech Progress Note Patient Details:  [name] [birth year] [id number]  Ortho Devices Type of Ortho Device: Short leg splint Ortho Device/Splint Location: RIGHT SHORT LEG SPLINT Ortho Device/Splint Interventions: Application   Cammer, Mickie Bail 05/05/2012, 5:14 PM

## 2012-05-05 NOTE — ED Notes (Signed)
ZOX:[id number]<AV> Expected date:05/05/12<BR> Expected time:11:32 AM<BR> Means of arrival:<BR> Comments:<BR> Syncopal episode

## 2012-05-05 NOTE — ED Notes (Signed)
Ortho at bedside.

## 2012-05-05 NOTE — ED Notes (Signed)
Attempted to call report to Darl Pikes, [place].  States she will call back.

## 2012-05-05 NOTE — ED Provider Notes (Addendum)
History     CSN: [id number]  Arrival date & time 05/05/12  1140   First MD Initiated Contact with Patient 05/05/12 1150      Chief Complaint  Patient presents with  . Loss of Consciousness    (Consider location/radiation/quality/duration/timing/severity/associated sxs/prior treatment) HPI Comments: [name] presents for evaluation from home.  He states he has intermittent lightheadedness.  While walking he felt suddenly "whoozy".  He is reported to have collapsed.  He did not strike his head or hit the floor hard.  Family and his home health aid were able to tend to him immediately and saw the event.  He was only transiently unconscious and states that it feels like he twisted his ankle.  He denies chest pain, SOB, fever, palpitations, and states he has no other pain.  Patient is a 71 y.o. male presenting with syncope. The history is provided by the patient. No language interpreter was used.  Loss of Consciousness This is a new problem. The current episode started less than 1 hour ago. The problem has been resolved. Pertinent negatives include no chest pain, no abdominal pain, no headaches and no shortness of breath. Nothing aggravates the symptoms. He has tried nothing for the symptoms.    Past Medical History  Diagnosis Date  . Hypertension   . ALLERGIC RHINITIS   . Malignant neoplasm of bronchus and lung, unspecified site 01/23/2012    Past Surgical History  Procedure Date  . Wrist surgery 1961    right wrist    Family History  Problem Relation Age of Onset  . Hypertension Sister   . Hypertension Brother   . Hypertension Mother   . Coronary artery disease Mother     History  Substance Use Topics  . Smoking status: Current Every Day Smoker -- 0.2 packs/day for 50 years    Types: Cigarettes  . Smokeless tobacco: Never Used  . Alcohol Use: No      Review of Systems  Constitutional: Positive for appetite change (since last round of chemo). Negative for fever,  chills, diaphoresis and fatigue.  HENT: Negative.   Respiratory: Negative for shortness of breath.   Cardiovascular: Positive for syncope. Negative for chest pain.  Gastrointestinal: Negative for abdominal pain.  Genitourinary: Negative.   Musculoskeletal: Positive for joint swelling (ankle pain).  Skin: Negative.   Neurological: Negative for headaches.    Allergies  Review of patient's allergies indicates no known allergies.  Home Medications   Current Outpatient Rx  Name  Route  Sig  Dispense  Refill  . DOXAZOSIN MESYLATE 8 MG PO TABS   Oral   Take 8 mg by mouth at bedtime.         Marland Kitchen HYDROCODONE-ACETAMINOPHEN 5-500 MG PO TABS      1/2 to 1 tablet by mouth every 6 hours as needed for pain   30 tablet   0   . OLMESARTAN MEDOXOMIL-HCTZ 40-12.5 MG PO TABS   Oral   Take 1 tablet by mouth daily. Takes 1/2 tablet by mouth daily         . PROCHLORPERAZINE MALEATE 10 MG PO TABS   Oral   Take 1 tablet (10 mg total) by mouth every 6 (six) hours as needed.   30 tablet   0     BP 130/56  Pulse 92  Temp 97.7 F (36.5 C) (Oral)  Resp 18  Ht 6\' 1"  (1.854 m)  Wt 250 lb (113.399 kg)  BMI 32.98 kg/m2  SpO2 99%  Physical Exam  Nursing note and vitals reviewed. Constitutional: He is oriented to person, place, and time. He appears well-developed and well-nourished. No distress.  HENT:  Head: Normocephalic and atraumatic.  Right Ear: External ear normal.  Left Ear: External ear normal.  Nose: Nose normal.  Mouth/Throat: Oropharynx is clear and moist. No oropharyngeal exudate.  Eyes: Conjunctivae normal are normal. Pupils are equal, round, and reactive to light. Right eye exhibits no discharge. Left eye exhibits no discharge. No scleral icterus.  Neck: Normal range of motion. Neck supple. No JVD present. No tracheal deviation present.  Cardiovascular: Regular rhythm, S1 normal, S2 normal, normal heart sounds, intact distal pulses and normal pulses.  Frequent extrasystoles  are present. PMI is not displaced.  Exam reveals no gallop and no decreased pulses.   No murmur heard. Pulmonary/Chest: Effort normal and breath sounds normal. No stridor. No respiratory distress. He has no wheezes. He has no rales. He exhibits no tenderness.  Abdominal: Soft. Bowel sounds are normal. He exhibits no distension and no mass. There is no tenderness. There is no rebound and no guarding.  Musculoskeletal: Normal range of motion. He exhibits tenderness. He exhibits no edema.       Right ankle: He exhibits swelling. He exhibits normal range of motion, no ecchymosis, no deformity, no laceration and normal pulse. tenderness. Lateral malleolus tenderness found. No medial malleolus, no AITFL, no CF ligament, no posterior TFL, no head of 5th metatarsal and no proximal fibula tenderness found. Achilles tendon exhibits no pain and no defect.  Lymphadenopathy:    He has no cervical adenopathy.  Neurological: He is alert and oriented to person, place, and time. No cranial nerve deficit.  Skin: Skin is warm and dry. No rash noted. He is not diaphoretic. No erythema. No pallor.  Psychiatric: He has a normal mood and affect. His behavior is normal.    ED Course  Procedures (including critical care time)   Labs Reviewed  CBC  COMPREHENSIVE METABOLIC PANEL  PROTIME-INR  TROPONIN I   Dg Chest 2 View  05/05/2012  *RADIOLOGY REPORT*  Clinical Data: Lung cancer, syncope, weakness  CHEST - 2 VIEW  Comparison: 03/09/2012, 01/27/2012, 01/18/2012  Findings: Nodular opacities in the right lower lobe noted compatible with known lung cancer.  Compared to 01/18/2012, there is slight improvement in the right lower lobe aeration compatible with resolving pneumonia or postobstructive process.  No large effusion or pneumothorax.  Left lung remains relatively clear. Stable heart size and vascularity.  Trachea is midline.  Slight pleural thickening on the right, stable.  IMPRESSION: Right lower lobe nodular  opacities compatible with known lung cancer.  Improving right lower lobe aeration compatible with resolving pneumonia/post obstructive pneumonitis.   Original Report Authenticated By: Judie Petit. Miles Costain, M.D.      No diagnosis found.   Date: 05/05/2012  Rate: 88 bpm  Rhythm: sinus  with ventricular trigeminy  QRS Axis: normal  Intervals: normal  ST/T Wave abnormalities: normal  Conduction Disutrbances:none  Narrative Interpretation:   Old EKG Reviewed: noted a sinus rhythm with bigeminy (PVCs) on 01/18/12      MDM  Pt presents s/p a syncopal event at home.  He appears nontoxic, note stable VS.  NAD.  He does appear to have an irregular pulse on exam.  EKG reveals trigeminy.  He denies palpitations.  Will obtain basic labs, CXR, orthostatic VS, and reassess.  He has had issues with low BP since starting chemotherapy.  1450.  Pt stable, NAD.  He  appears comfortable.  Note negative orthostatic vital signs.  He has no obvious source for infection.  Note arrythmia is the only significant abnormality found today and this may be a chronic finding as an August EKG had similar findings. Secondary to his age and risk factors, discussed admission with the on-call hospitalist for observation and further testing.  He has been accepted to a telemetry bed.  1650.  Pt was able to tolerate dorsi and plantar flexion of the right ankle as well as inversion and eversion during my exam however he did not tolerate weight bearing.  X-ray of the ankle demonstrates a distal fibula fracture.  A splint has been ordered.  Discussed this with Dr. Gwenlyn Perking.  He will consult ortho for further mgmnt.  He has no proximal knee/fibula tenderness.     Tobin Chad, MD 05/05/12 1512  Tobin Chad, MD 05/05/12 [phone]

## 2012-05-06 DIAGNOSIS — S82899A Other fracture of unspecified lower leg, initial encounter for closed fracture: Secondary | ICD-10-CM

## 2012-05-06 DIAGNOSIS — S82891A Other fracture of right lower leg, initial encounter for closed fracture: Secondary | ICD-10-CM | POA: Diagnosis present

## 2012-05-06 DIAGNOSIS — I359 Nonrheumatic aortic valve disorder, unspecified: Secondary | ICD-10-CM

## 2012-05-06 LAB — BASIC METABOLIC PANEL
BUN: 34 mg/dL — ABNORMAL HIGH (ref 6–23)
Chloride: 101 mEq/L (ref 96–112)
Creatinine, Ser: 1.55 mg/dL — ABNORMAL HIGH (ref 0.50–1.35)
GFR calc Af Amer: 50 mL/min — ABNORMAL LOW (ref >90)
GFR calc non Af Amer: 43 mL/min — ABNORMAL LOW (ref >90)

## 2012-05-06 LAB — CBC
HCT: 35.9 % — ABNORMAL LOW (ref 39.0–52.0)
MCHC: 35.1 g/dL (ref 30.0–36.0)
Platelets: 121 10*3/uL — ABNORMAL LOW (ref 150–400)
RDW: 14.9 % (ref 11.5–15.5)
WBC: 6.7 10*3/uL (ref 4.0–10.5)

## 2012-05-06 LAB — TROPONIN I: Troponin I: <0.3 ng/mL (ref <0.30)

## 2012-05-06 MED ORDER — OXYCODONE HCL 5 MG PO TABS
5.0000 mg | ORAL_TABLET | Freq: Once | ORAL | Status: AC
  Administered 2012-05-06: 5 mg via ORAL
  Filled 2012-05-06: qty 1

## 2012-05-06 MED ORDER — SODIUM CHLORIDE 0.9 % IV SOLN
INTRAVENOUS | Status: DC
  Administered 2012-05-06: 20 mL/h via INTRAVENOUS

## 2012-05-06 NOTE — Consult Note (Signed)
Reason for Consult: Evaluate right ankle fracture Referring Physician: Dr. Leigh Aurora [name] is an 71 y.o. male.  HPI: The patient is a very pleasant 71 year old male with a history of lung cancer. He had a loss of consciousness yesterday and fell. He had concern of right ankle pain and swelling. X-rays revealed a lateral malleolus fracture which is minimally displaced. He denies other injuries with the fall. He tells me he feels that the fall may have been related to his recent cancer treatments. He's not having any chest pain or shortness of breath.  Past Medical History  Diagnosis Date  . Hypertension   . ALLERGIC RHINITIS   . Malignant neoplasm of bronchus and lung, unspecified site 01/23/2012    Past Surgical History  Procedure Date  . Wrist surgery 1961    right wrist    Family History  Problem Relation Age of Onset  . Hypertension Sister   . Hypertension Brother   . Hypertension Mother   . Coronary artery disease Mother     Social History:  reports that he quit smoking yesterday. His smoking use included Cigarettes. He has a 12.5 pack-year smoking history. He has never used smokeless tobacco. He reports that he does not drink alcohol or use illicit drugs.  Allergies: No Known Allergies  Medications: I have reviewed the patient's current medications.  Results for orders placed during the hospital encounter of 05/05/12 (from the past 48 hour(s))  CBC     Status: Abnormal   Collection Time   05/05/12  1:30 PM      Component Value Range Comment   WBC 4.8  4.0 - 10.5 K/uL    RBC 3.81 (*) 4.22 - 5.81 MIL/uL    Hemoglobin 13.4  13.0 - 17.0 g/dL    HCT 16.1 (*) 09.6 - 52.0 %    MCV 99.5  78.0 - 100.0 fL    MCH 35.2 (*) 26.0 - 34.0 pg    MCHC 35.4  30.0 - 36.0 g/dL    RDW 04.5  40.9 - 81.1 %    Platelets 139 (*) 150 - 400 K/uL   COMPREHENSIVE METABOLIC PANEL     Status: Abnormal   Collection Time   05/05/12  1:30 PM      Component Value Range Comment   Sodium 136   135 - 145 mEq/L    Potassium 4.6  3.5 - 5.1 mEq/L    Chloride 102  96 - 112 mEq/L    CO2 27  19 - 32 mEq/L    Glucose, Bld 109 (*) 70 - 99 mg/dL    BUN 38 (*) 6 - 23 mg/dL    Creatinine, Ser 9.14 (*) 0.50 - 1.35 mg/dL    Calcium 78.2  8.4 - 10.5 mg/dL    Total Protein 6.9  6.0 - 8.3 g/dL    Albumin 3.5  3.5 - 5.2 g/dL    AST 18  0 - 37 U/L    ALT 16  0 - 53 U/L    Alkaline Phosphatase 66  39 - 117 U/L    Total Bilirubin 0.4  0.3 - 1.2 mg/dL    GFR calc non Af Amer 41 (*) >90 mL/min    GFR calc Af Amer 47 (*) >90 mL/min   PROTIME-INR     Status: Normal   Collection Time   05/05/12  1:30 PM      Component Value Range Comment   Prothrombin Time 13.5  11.6 - 15.2 seconds  INR 1.04  0.00 - 1.49   TROPONIN I     Status: Normal   Collection Time   05/05/12  1:30 PM      Component Value Range Comment   Troponin I <0.30  <0.30 ng/mL   CORTISOL     Status: Normal   Collection Time   05/05/12  4:50 PM      Component Value Range Comment   Cortisol, Plasma 10.4     TSH     Status: Normal   Collection Time   05/05/12  4:51 PM      Component Value Range Comment   TSH 0.670  0.350 - 4.500 uIU/mL   VITAMIN B12     Status: Abnormal   Collection Time   05/05/12  4:51 PM      Component Value Range Comment   Vitamin B-12 1031 (*) 211 - 911 pg/mL   PHOSPHORUS     Status: Normal   Collection Time   05/05/12  4:51 PM      Component Value Range Comment   Phosphorus 3.5  2.3 - 4.6 mg/dL   MAGNESIUM     Status: Normal   Collection Time   05/05/12  4:51 PM      Component Value Range Comment   Magnesium 1.8  1.5 - 2.5 mg/dL   HEPATIC FUNCTION PANEL     Status: Normal   Collection Time   05/05/12  4:51 PM      Component Value Range Comment   Total Protein 7.0  6.0 - 8.3 g/dL    Albumin 3.7  3.5 - 5.2 g/dL    AST 18  0 - 37 U/L    ALT 17  0 - 53 U/L    Alkaline Phosphatase 67  39 - 117 U/L    Total Bilirubin 0.5  0.3 - 1.2 mg/dL    Bilirubin, Direct 0.1  0.0 - 0.3 mg/dL    Indirect  Bilirubin 0.4  0.3 - 0.9 mg/dL   URINALYSIS, ROUTINE W REFLEX MICROSCOPIC     Status: Abnormal   Collection Time   05/05/12  5:56 PM      Component Value Range Comment   Color, Urine YELLOW  YELLOW    APPearance CLEAR  CLEAR    Specific Gravity, Urine 1.019  1.005 - 1.030    pH 6.5  5.0 - 8.0    Glucose, UA NEGATIVE  NEGATIVE mg/dL    Hgb urine dipstick NEGATIVE  NEGATIVE    Bilirubin Urine NEGATIVE  NEGATIVE    Ketones, ur NEGATIVE  NEGATIVE mg/dL    Protein, ur NEGATIVE  NEGATIVE mg/dL    Urobilinogen, UA 0.2  0.0 - 1.0 mg/dL    Nitrite NEGATIVE  NEGATIVE    Leukocytes, UA SMALL (*) NEGATIVE   URINE MICROSCOPIC-ADD ON     Status: Normal   Collection Time   05/05/12  5:56 PM      Component Value Range Comment   Squamous Epithelial / LPF RARE  RARE    WBC, UA 0-2  <3 WBC/hpf    Bacteria, UA RARE  RARE   TROPONIN I     Status: Normal   Collection Time   05/05/12  8:21 PM      Component Value Range Comment   Troponin I <0.30  <0.30 ng/mL   TROPONIN I     Status: Normal   Collection Time   05/06/12  1:01 AM      Component Value Range  Comment   Troponin I <0.30  <0.30 ng/mL   BASIC METABOLIC PANEL     Status: Abnormal   Collection Time   05/06/12  4:52 AM      Component Value Range Comment   Sodium 136  135 - 145 mEq/L    Potassium 4.5  3.5 - 5.1 mEq/L    Chloride 101  96 - 112 mEq/L    CO2 26  19 - 32 mEq/L    Glucose, Bld 142 (*) 70 - 99 mg/dL    BUN 34 (*) 6 - 23 mg/dL    Creatinine, Ser 1.61 (*) 0.50 - 1.35 mg/dL    Calcium 09.6  8.4 - 10.5 mg/dL    GFR calc non Af Amer 43 (*) >90 mL/min    GFR calc Af Amer 50 (*) >90 mL/min   CBC     Status: Abnormal   Collection Time   05/06/12  4:52 AM      Component Value Range Comment   WBC 6.7  4.0 - 10.5 K/uL    RBC 3.57 (*) 4.22 - 5.81 MIL/uL    Hemoglobin 12.6 (*) 13.0 - 17.0 g/dL    HCT 04.5 (*) 40.9 - 52.0 %    MCV 100.6 (*) 78.0 - 100.0 fL    MCH 35.3 (*) 26.0 - 34.0 pg    MCHC 35.1  30.0 - 36.0 g/dL    RDW 81.1   91.4 - 78.2 %    Platelets 121 (*) 150 - 400 K/uL     Dg Chest 2 View  05/05/2012  *RADIOLOGY REPORT*  Clinical Data: Lung cancer, syncope, weakness  CHEST - 2 VIEW  Comparison: 03/09/2012, 01/27/2012, 01/18/2012  Findings: Nodular opacities in the right lower lobe noted compatible with known lung cancer.  Compared to 01/18/2012, there is slight improvement in the right lower lobe aeration compatible with resolving pneumonia or postobstructive process.  No large effusion or pneumothorax.  Left lung remains relatively clear. Stable heart size and vascularity.  Trachea is midline.  Slight pleural thickening on the right, stable.  IMPRESSION: Right lower lobe nodular opacities compatible with known lung cancer.  Improving right lower lobe aeration compatible with resolving pneumonia/post obstructive pneumonitis.   Original Report Authenticated By: Judie Petit. Shick, M.D.    Dg Ankle Complete Right  05/05/2012  *RADIOLOGY REPORT*  Clinical Data: Fall with right ankle injury.  RIGHT ANKLE - COMPLETE 3+ VIEW  Comparison: None.  Findings: Mildly displaced oblique fracture is present through the distal fibula that extends into the ankle mortise.  There is associated mild subluxation of the talar dome laterally with widening of the medial mortise.  In the lateral projection, there is potentially a subtle nondisplaced posterior malleolar fracture. However, this may be overlap of the lateral malleolar fracture rather than a separate fracture.  IMPRESSION: Mildly displaced fracture of the distal fibula with associated mild talar subluxation laterally.  There may also be a subtle component of a posterior malleolar fracture.   Original Report Authenticated By: Irish Lack, M.D.     Review of Systems  Neurological: Positive for loss of consciousness and weakness.  All other systems reviewed and are negative.   Blood pressure 119/78, pulse 83, temperature 98.8 F (37.1 C), temperature source Oral, resp. rate 18, height  6\' 1"  (1.854 m), weight 108.773 kg (239 lb 12.8 oz), SpO2 97.00%. Physical Exam  Constitutional: He is oriented to person, place, and time. He appears well-developed and well-nourished.  HENT:  Head: Atraumatic.  Eyes: EOM are normal.  Cardiovascular: Intact distal pulses.   Respiratory: Effort normal.  Musculoskeletal:       Right ankle with intact splint. Toes warm and well-perfused. Wiggles toes. Upper extremities and opposite lower extremity atraumatic.Marland Kitchen  Neurological: He is alert and oriented to person, place, and time.  Skin: Skin is warm and dry.  Psychiatric: He has a normal mood and affect.    Assessment/Plan: Right ankle Weber B. functional bimalleolar ankle fracture Talked about diagnosis prognosis and treatment options. Given the disruption of the medial ligament the ankle is unstable and the mortise is slightly off at this point. I recommended surgical management to stabilize the ankle and promote anatomic restoration. He understands and would like to go to surgery. He is currently having his syncope workup. There is no urgency to surgery. We talked about the possibility of possibly doing this tomorrow evening or on Tuesday. Alternatively this could be done anytime in the next 2 weeks.  Mable Paris 05/06/2012, 8:23 AM

## 2012-05-06 NOTE — Progress Notes (Signed)
[facility] PROGRESS NOTE  [name] ZOX:[id number] DOB: [birth year] DOA: 05/05/2012 PCP: [name], MD  Assessment/Plan: 1-Syncope and collapse: Patient syncope episode most likely 2/2 to orthostasis vs arrhythmias -Will continue monitoring on telemetry  -Due to positive heart murmur on exam (new for the patient); Will follow 2-D echo  -cardiac enzymes, TSH, phosphorus, Mg, B12 level and a urinalysis WNL.  -Continue gentle fluid resuscitation and hold antihypertensive agents (especially diuretics).  -Cortisol level WNL.  2-Small cell lung carcinoma: Patient receiving active chemotherapy treatment at this moment. Dr. Shirline Frees who is his cancer doctor has been notified of the patient's admission. Will follow his recommendations.   3-HTN (hypertension): Continue to be soft when changing positions. Will continue holding antihypertensive agents and continue gentle hydration.    4-CKD (chronic kidney disease) stage 3, GFR 30-59 ml/min: Creatinine 1.55; appears to be at baseline for him. Will follow kidney function. Gentle hydration and hold ARB's/HCTZ for now.   5-GERD: Continue PPI.   6-Thrombocytopenia: No signs of overt bleeding. Most likely secondary to ongoing chemotherapy. Avoid heparin products. Will use SCDs for DVT prophylaxis.   7-Heart murmur: Appears to be aortic stenosis per physical exam; but will follow results of 2-D echo for better evaluation.   8-Right ankle pain: Patient reports that he twisted his ankle during the fall at the moment that he had the syncope event. Ankle x-ray demonstrated Mildly displaced fracture of the distal fibula with associated mild talar subluxation laterally. Will continue splint, no weight bearing and use PRN pain meds. Orthopedic service has been contacted (Dr. Ave Filter) and the plan is for surgical stabilization of the fracture either 11/25 or 11/26.   9-Weakness: multifactorial; and worsen with chemotherapy. Will ask PT and OT to  see him after surgery  DVT: SCD's   Code Status: Full Family Communication: no family at bedside Disposition Plan: Patient will like to go home when medically stable; after surgery will ask PT to evaluate and provide recommendations.   Consultants:  Ortho (Dr. Ave Filter)  Oncology (Dr. Arbutus Ped)  Procedures:  2-D echo (results pending)  Right ankle x-ray (see complete report below; but positive for acute mildly displaced fx)  Antibiotics:  none  HPI/Subjective: Afebrile; no CP, no SOB, no palpitations; denies any further lightheadedness or dizziness. Mild orthostatic changes this morning, patient asymptomatic.  Objective: Filed Vitals:   05/06/12 0506 05/06/12 0540 05/06/12 0545 05/06/12 0549  BP:  138/71 122/76 119/78  Pulse:  73 83   Temp:  98.8 F (37.1 C)    TempSrc:  Oral    Resp:  18    Height:      Weight: 108.773 kg (239 lb 12.8 oz)     SpO2:  97%      Intake/Output Summary (Last 24 hours) at 05/06/12 1149 Last data filed at 05/06/12 0900  Gross per 24 hour  Intake    965 ml  Output    725 ml  Net    240 ml   Filed Weights   05/05/12 1155 05/06/12 0506  Weight: 113.399 kg (250 lb) 108.773 kg (239 lb 12.8 oz)    Exam:   General: NAD; afebrile; cooperative to examination; AAOX3  Cardiovascular: + SEM; no rubs or gallops, S1 and S2  Respiratory: CTA  Abdomen: soft, no tender, positive BS  Neuro: AAOX3; no focal deficit; CN intact  Data Reviewed: Basic Metabolic Panel:  Lab 05/06/12 8119 05/05/12 1651 05/05/12 1330 05/01/12 0921  NA 136 -- 136 140  K 4.5 --  4.6 4.5  CL 101 -- 102 109*  CO2 26 -- 27 24  GLUCOSE 142* -- 109* 164*  BUN 34* -- 38* 31.0*  CREATININE 1.55* -- 1.63* 1.7*  CALCIUM 10.4 -- 10.5 10.3  MG -- 1.8 -- 1.6  PHOS -- 3.5 -- --   Liver Function Tests:  Lab 05/05/12 1651 05/05/12 1330 05/01/12 0921  AST 18 18 23   ALT 17 16 22   ALKPHOS 67 66 71  BILITOT 0.5 0.4 0.40  PROT 7.0 6.9 6.6  ALBUMIN 3.7 3.5 3.5    CBC:  Lab 05/06/12 0452 05/05/12 1330 05/01/12 0920  WBC 6.7 4.8 3.9*  NEUTROABS -- -- 2.2  HGB 12.6* 13.4 13.5  HCT 35.9* 37.9* 38.8  MCV 100.6* 99.5 100.5*  PLT 121* 139* 152   Cardiac Enzymes:  Lab 05/06/12 0101 05/05/12 2021 05/05/12 1330  CKTOTAL -- -- --  CKMB -- -- --  CKMBINDEX -- -- --  TROPONINI <0.30 <0.30 <0.30    Studies: Dg Chest 2 View  05/05/2012  *RADIOLOGY REPORT*  Clinical Data: Lung cancer, syncope, weakness  CHEST - 2 VIEW  Comparison: 03/09/2012, 01/27/2012, 01/18/2012  Findings: Nodular opacities in the right lower lobe noted compatible with known lung cancer.  Compared to 01/18/2012, there is slight improvement in the right lower lobe aeration compatible with resolving pneumonia or postobstructive process.  No large effusion or pneumothorax.  Left lung remains relatively clear. Stable heart size and vascularity.  Trachea is midline.  Slight pleural thickening on the right, stable.  IMPRESSION: Right lower lobe nodular opacities compatible with known lung cancer.  Improving right lower lobe aeration compatible with resolving pneumonia/post obstructive pneumonitis.   Original Report Authenticated By: Judie Petit. Shick, M.D.    Dg Ankle Complete Right  05/05/2012  *RADIOLOGY REPORT*  Clinical Data: Fall with right ankle injury.  RIGHT ANKLE - COMPLETE 3+ VIEW  Comparison: None.  Findings: Mildly displaced oblique fracture is present through the distal fibula that extends into the ankle mortise.  There is associated mild subluxation of the talar dome laterally with widening of the medial mortise.  In the lateral projection, there is potentially a subtle nondisplaced posterior malleolar fracture. However, this may be overlap of the lateral malleolar fracture rather than a separate fracture.  IMPRESSION: Mildly displaced fracture of the distal fibula with associated mild talar subluxation laterally.  There may also be a subtle component of a posterior malleolar fracture.    Original Report Authenticated By: Irish Lack, M.D.     Scheduled Meds:   . [COMPLETED] sodium chloride   Intravenous STAT  . folic acid  1 mg Oral Daily  . [COMPLETED] oxyCODONE  5 mg Oral Once  . pantoprazole  40 mg Oral Q1200  . sodium chloride  3 mL Intravenous Q12H  . vitamin B-12  1,000 mcg Oral Daily   Continuous Infusions:   . [EXPIRED] sodium chloride Stopped (05/06/12 0314)  . sodium chloride      Principal Problem:  *Syncope and collapse Active Problems:  Small cell lung carcinoma  HTN (hypertension)  CKD (chronic kidney disease) stage 3, GFR 30-59 ml/min  Thrombocytopenia  Heart murmur  Right ankle pain  Ankle fracture, right    Time spent: >30 minutes    Denyla Cortese  [facility] Pager [phone]. If 8PM-8AM, please contact night-coverage at [email or web address], password [facility] 05/06/2012, 11:49 AM  LOS: 1 day

## 2012-05-06 NOTE — Progress Notes (Signed)
  Echocardiogram 2D Echocardiogram has been performed.  [name] 05/06/2012, 9:18 AM

## 2012-05-06 NOTE — Progress Notes (Signed)
Patient ID: [name]  male  ZOX:[id number]    DOB: [birth year]    DOA: 05/05/2012  PCP: [name], MD  Assessment/Plan: Principal Problem:  *Syncope and collapse: Likely secondary to dehydration or vasovagal - Orthostatic negative, gentle hydration provider, 2-D echo today - Hold Benicar/ HCTZ  Active Problems: Right ankle fracture: - Orthopedics consulted, decision for surgery inpatient versus outpatient, will follow   Small cell lung carcinoma - Dr. Arbutus Ped notified of patient's admission   HTN (hypertension): BP still soft aside, continue to hold Benicar/HCTZ   CKD (chronic kidney disease) stage 3, GFR 30-59 ml/min, BASELINE Cr between 1.3-1.7, trending down - Hold Benicar/HCTZ   Thrombocytopenia: monitor counts   Heart murmur: 2D ECHO results pending  DVT Prophylaxis: SCD's   Code Status: FC  Disposition:   Subjective: Pain controlled, feeling okay, no nausea/vomiting/CP/SOB  Objective: Weight change:   Intake/Output Summary (Last 24 hours) at 05/06/12 1132 Last data filed at 05/06/12 0900  Gross per 24 hour  Intake    965 ml  Output    725 ml  Net    240 ml   Blood pressure 119/78, pulse 83, temperature 98.8 F (37.1 C), temperature source Oral, resp. rate 18, height 6\' 1"  (1.854 m), weight 108.773 kg (239 lb 12.8 oz), SpO2 97.00%.  Physical Exam: General: Alert and awake, oriented x3, not in any acute distress. HEENT: anicteric sclera, pupils reactive to light and accommodation, EOMI CVS: S1-S2 clear, SEM Chest: CTAB, no wheezing, rales or rhonchi Abdomen: soft nontender, nondistended, normal bowel sounds, no organomegaly Extremities: no cyanosis, clubbing or edema noted bilaterally. Right foot in dressing Neuro: Cranial nerves II-XII intact, no focal neurological deficits  Lab Results: Basic Metabolic Panel:  Lab 05/06/12 8119 05/05/12 1651 05/05/12 1330  NA 136 -- 136  K 4.5 -- 4.6  CL 101 -- 102  CO2 26 -- 27  GLUCOSE 142* -- 109*    BUN 34* -- 38*  CREATININE 1.55* -- 1.63*  CALCIUM 10.4 -- 10.5  MG -- 1.8 --  PHOS -- 3.5 --   Liver Function Tests:  Lab 05/05/12 1651 05/05/12 1330  AST 18 18  ALT 17 16  ALKPHOS 67 66  BILITOT 0.5 0.4  PROT 7.0 6.9  ALBUMIN 3.7 3.5   CBC:  Lab 05/06/12 0452 05/05/12 1330  WBC 6.7 4.8  NEUTROABS -- --  HGB 12.6* 13.4  HCT 35.9* 37.9*  MCV 100.6* 99.5  PLT 121* 139*   Cardiac Enzymes:  Lab 05/06/12 0101 05/05/12 2021 05/05/12 1330  CKTOTAL -- -- --  CKMB -- -- --  CKMBINDEX -- -- --  TROPONINI <0.30 <0.30 <0.30    Studies/Results: Dg Chest 2 View  05/05/2012  *RADIOLOGY REPORT*  Clinical Data: Lung cancer, syncope, weakness  CHEST - 2 VIEW  Comparison: 03/09/2012, 01/27/2012, 01/18/2012  Findings: Nodular opacities in the right lower lobe noted compatible with known lung cancer.  Compared to 01/18/2012, there is slight improvement in the right lower lobe aeration compatible with resolving pneumonia or postobstructive process.  No large effusion or pneumothorax.  Left lung remains relatively clear. Stable heart size and vascularity.  Trachea is midline.  Slight pleural thickening on the right, stable.  IMPRESSION: Right lower lobe nodular opacities compatible with known lung cancer.  Improving right lower lobe aeration compatible with resolving pneumonia/post obstructive pneumonitis.   Original Report Authenticated By: Judie Petit. Shick, M.D.    Dg Ankle Complete Right  05/05/2012  *RADIOLOGY REPORT*  Clinical Data: Fall with right  ankle injury.  RIGHT ANKLE - COMPLETE 3+ VIEW  Comparison: None.  Findings: Mildly displaced oblique fracture is present through the distal fibula that extends into the ankle mortise.  There is associated mild subluxation of the talar dome laterally with widening of the medial mortise.  In the lateral projection, there is potentially a subtle nondisplaced posterior malleolar fracture. However, this may be overlap of the lateral malleolar fracture rather  than a separate fracture.  IMPRESSION: Mildly displaced fracture of the distal fibula with associated mild talar subluxation laterally.  There may also be a subtle component of a posterior malleolar fracture.   Original Report Authenticated By: Irish Lack, M.D.     Medications: Scheduled Meds:   . [COMPLETED] sodium chloride   Intravenous STAT  . folic acid  1 mg Oral Daily  . [COMPLETED] oxyCODONE  5 mg Oral Once  . pantoprazole  40 mg Oral Q1200  . sodium chloride  3 mL Intravenous Q12H  . vitamin B-12  1,000 mcg Oral Daily      LOS: 1 day   Skyrah Krupp M.D. [facility] 05/06/2012, 11:32 AM Pager: [phone]  If 7PM-7AM, please contact night-coverage [email or web address] Password [email or web address]

## 2012-05-07 ENCOUNTER — Inpatient Hospital Stay (HOSPITAL_COMMUNITY): Payer: Medicare Other

## 2012-05-07 ENCOUNTER — Encounter (HOSPITAL_COMMUNITY)
Admission: EM | Disposition: A | Discharge status: Rehabilitation Facility | Payer: Self-pay | Source: Home / Self Care | Attending: Internal Medicine

## 2012-05-07 ENCOUNTER — Encounter (HOSPITAL_COMMUNITY): Payer: Self-pay | Admitting: Anesthesiology

## 2012-05-07 ENCOUNTER — Inpatient Hospital Stay (HOSPITAL_COMMUNITY): Payer: Medicare Other | Admitting: Anesthesiology

## 2012-05-07 DIAGNOSIS — C7A1 Malignant poorly differentiated neuroendocrine tumors: Secondary | ICD-10-CM

## 2012-05-07 DIAGNOSIS — I5032 Chronic diastolic (congestive) heart failure: Secondary | ICD-10-CM

## 2012-05-07 HISTORY — PX: ORIF ANKLE FRACTURE: SHX5408

## 2012-05-07 LAB — BASIC METABOLIC PANEL
BUN: 30 mg/dL — ABNORMAL HIGH (ref 6–23)
Calcium: 10.5 mg/dL (ref 8.4–10.5)
Chloride: 102 mEq/L (ref 96–112)
Creatinine, Ser: 1.4 mg/dL — ABNORMAL HIGH (ref 0.50–1.35)
GFR calc Af Amer: 57 mL/min — ABNORMAL LOW (ref >90)

## 2012-05-07 LAB — CBC
HCT: 34.1 % — ABNORMAL LOW (ref 39.0–52.0)
MCH: 35.5 pg — ABNORMAL HIGH (ref 26.0–34.0)

## 2012-05-07 SURGERY — OPEN REDUCTION INTERNAL FIXATION (ORIF) ANKLE FRACTURE
Anesthesia: General | Site: Ankle | Laterality: Right | Wound class: Clean

## 2012-05-07 MED ORDER — SODIUM CHLORIDE 0.9 % IV SOLN
INTRAVENOUS | Status: DC
  Administered 2012-05-07: 20 mL/h via INTRAVENOUS

## 2012-05-07 MED ORDER — SODIUM CHLORIDE 0.45 % IV SOLN
INTRAVENOUS | Status: DC
  Administered 2012-05-07: 125 mL/h via INTRAVENOUS

## 2012-05-07 MED ORDER — HYDROMORPHONE HCL PF 1 MG/ML IJ SOLN
0.2500 mg | INTRAMUSCULAR | Status: DC | PRN
  Administered 2012-05-07 (×2): 0.5 mg via INTRAVENOUS

## 2012-05-07 MED ORDER — PROMETHAZINE HCL 25 MG/ML IJ SOLN: 6.2500 mg | INTRAMUSCULAR | Status: DC | PRN

## 2012-05-07 MED ORDER — HYDROMORPHONE HCL PF 1 MG/ML IJ SOLN
INTRAMUSCULAR | Status: AC
  Filled 2012-05-07: qty 1

## 2012-05-07 MED ORDER — 0.9 % SODIUM CHLORIDE (POUR BTL) OPTIME
TOPICAL | Status: DC | PRN
  Administered 2012-05-07: 1000 mL

## 2012-05-07 MED ORDER — PROPOFOL 10 MG/ML IV BOLUS
INTRAVENOUS | Status: DC | PRN
  Administered 2012-05-07: 150 mg via INTRAVENOUS
  Administered 2012-05-07: 50 mg via INTRAVENOUS

## 2012-05-07 MED ORDER — METOCLOPRAMIDE HCL 10 MG PO TABS: 5.0000 mg | ORAL_TABLET | Freq: Three times a day (TID) | ORAL | Status: DC | PRN

## 2012-05-07 MED ORDER — CEFAZOLIN SODIUM-DEXTROSE 2-3 GM-% IV SOLR
2.0000 g | Freq: Once | INTRAVENOUS | Status: AC
  Administered 2012-05-07: 2 g via INTRAVENOUS
  Filled 2012-05-07: qty 50

## 2012-05-07 MED ORDER — ACETAMINOPHEN 10 MG/ML IV SOLN
INTRAVENOUS | Status: DC | PRN
  Administered 2012-05-07: 1000 mg via INTRAVENOUS

## 2012-05-07 MED ORDER — ONDANSETRON HCL 4 MG/2ML IJ SOLN
INTRAMUSCULAR | Status: DC | PRN
  Administered 2012-05-07: 4 mg via INTRAVENOUS

## 2012-05-07 MED ORDER — ONDANSETRON HCL 4 MG PO TABS: 4.0000 mg | ORAL_TABLET | Freq: Four times a day (QID) | ORAL | Status: DC | PRN

## 2012-05-07 MED ORDER — ONDANSETRON HCL 4 MG/2ML IJ SOLN: 4.0000 mg | Freq: Four times a day (QID) | INTRAMUSCULAR | Status: DC | PRN

## 2012-05-07 MED ORDER — BUPIVACAINE HCL (PF) 0.5 % IJ SOLN
INTRAMUSCULAR | Status: AC
  Filled 2012-05-07: qty 30

## 2012-05-07 MED ORDER — KCL IN DEXTROSE-NACL 20-5-0.45 MEQ/L-%-% IV SOLN
INTRAVENOUS | Status: AC
  Filled 2012-05-07: qty 1000

## 2012-05-07 MED ORDER — CEFAZOLIN SODIUM-DEXTROSE 2-3 GM-% IV SOLR
2.0000 g | Freq: Four times a day (QID) | INTRAVENOUS | Status: AC
  Administered 2012-05-07 – 2012-05-08 (×3): 2 g via INTRAVENOUS
  Filled 2012-05-07 (×3): qty 50

## 2012-05-07 MED ORDER — KCL IN DEXTROSE-NACL 20-5-0.45 MEQ/L-%-% IV SOLN
INTRAVENOUS | Status: DC
  Administered 2012-05-07: 18:00:00 via INTRAVENOUS
  Administered 2012-05-08: 100 mL/h via INTRAVENOUS
  Filled 2012-05-07 (×4): qty 1000

## 2012-05-07 MED ORDER — FENTANYL CITRATE 0.05 MG/ML IJ SOLN
INTRAMUSCULAR | Status: DC | PRN
  Administered 2012-05-07 (×7): 50 ug via INTRAVENOUS

## 2012-05-07 MED ORDER — ENOXAPARIN SODIUM 40 MG/0.4ML ~~LOC~~ SOLN
40.0000 mg | SUBCUTANEOUS | Status: DC
  Administered 2012-05-08 – 2012-05-09 (×2): 40 mg via SUBCUTANEOUS
  Filled 2012-05-07 (×3): qty 0.4

## 2012-05-07 MED ORDER — METOCLOPRAMIDE HCL 5 MG/ML IJ SOLN: 5.0000 mg | Freq: Three times a day (TID) | INTRAMUSCULAR | Status: DC | PRN

## 2012-05-07 SURGICAL SUPPLY — 41 items
BANDAGE ELASTIC 4 VELCRO ST LF (GAUZE/BANDAGES/DRESSINGS) ×1 IMPLANT
BANDAGE ELASTIC 6 VELCRO ST LF (GAUZE/BANDAGES/DRESSINGS) ×1 IMPLANT
BIT DRILL 2.5X110 QC LCP DISP (BIT) ×1 IMPLANT
BIT DRILL QC 3.5X110 (BIT) ×1 IMPLANT
CLOTH BEACON ORANGE TIMEOUT ST (SAFETY) ×2 IMPLANT
COVER SURGICAL LIGHT HANDLE (MISCELLANEOUS) ×2 IMPLANT
CUFF TOURN SGL QUICK 34 (TOURNIQUET CUFF) ×2
CUFF TRNQT CYL 34X4X40X1 (TOURNIQUET CUFF) ×1 IMPLANT
DECANTER SPIKE VIAL GLASS SM (MISCELLANEOUS) ×2 IMPLANT
DRAPE C-ARM 42X72 X-RAY (DRAPES) ×2 IMPLANT
DRAPE U-SHAPE 47X51 STRL (DRAPES) ×2 IMPLANT
DRSG PAD ABDOMINAL 8X10 ST (GAUZE/BANDAGES/DRESSINGS) ×3 IMPLANT
ELECT REM PT RETURN 9FT ADLT (ELECTROSURGICAL) ×2
ELECTRODE REM PT RTRN 9FT ADLT (ELECTROSURGICAL) ×1 IMPLANT
GLOVE BIO SURGEON STRL SZ8 (GLOVE) ×2 IMPLANT
GLOVE BIOGEL PI IND STRL 8 (GLOVE) ×1 IMPLANT
GLOVE BIOGEL PI INDICATOR 8 (GLOVE) ×1
GLOVE ECLIPSE 7.5 STRL STRAW (GLOVE) ×2 IMPLANT
KIT 1/3 TUB PL 7H 85M (Orthopedic Implant) IMPLANT
MANIFOLD NEPTUNE II (INSTRUMENTS) ×2 IMPLANT
PACK LOWER EXTREMITY WL (CUSTOM PROCEDURE TRAY) ×2 IMPLANT
PAD CAST 4YDX4 CTTN HI CHSV (CAST SUPPLIES) ×2 IMPLANT
PADDING CAST COTTON 4X4 STRL (CAST SUPPLIES)
PADDING CAST COTTON 6X4 STRL (CAST SUPPLIES) ×1 IMPLANT
POSITIONER SURGICAL ARM (MISCELLANEOUS) ×2 IMPLANT
PROS 1/3 TUB PL 7H 85M (Orthopedic Implant) ×2 IMPLANT
SCREW CANC FT 4.0X20 (Screw) ×1 IMPLANT
SCREW CANC FT/18 4.0 (Screw) ×1 IMPLANT
SCREW CORTEX 3.5 14MM (Screw) ×3 IMPLANT
SCREW CORTEX 3.5 24MM (Screw) ×1 IMPLANT
SCREW LOCK CORT ST 3.5X14 (Screw) IMPLANT
SCREW LOCK CORT ST 3.5X24 (Screw) IMPLANT
SPLINT PLASTER CAST XFAST 5X30 (CAST SUPPLIES) IMPLANT
SPLINT PLASTER XFAST SET 5X30 (CAST SUPPLIES) ×1
SPONGE GAUZE 4X4 12PLY (GAUZE/BANDAGES/DRESSINGS) ×1 IMPLANT
STRIP CLOSURE SKIN 1/2X4 (GAUZE/BANDAGES/DRESSINGS) ×1 IMPLANT
SUT ETHILON 4 0 PS 2 18 (SUTURE) ×4 IMPLANT
SUT MNCRL AB 4-0 PS2 18 (SUTURE) ×1 IMPLANT
SUT VIC AB 2-0 CT1 27 (SUTURE) ×2
SUT VIC AB 2-0 CT1 TAPERPNT 27 (SUTURE) ×1 IMPLANT
SYR CONTROL 10ML LL (SYRINGE) ×1 IMPLANT

## 2012-05-07 NOTE — Care Management Note (Unsigned)
    Page 1 of 1   05/07/2012     4:24:21 PM   CARE MANAGEMENT NOTE 05/07/2012  Patient:  [name], [name]   Account Number:  [id number]  Date Initiated:  05/07/2012  Documentation initiated by:  Lanier Clam  Subjective/Objective Assessment:   ADMITTED W/SYNCOPE.R ANKLE FX.ZO:[id number] CA.     Action/Plan:   FROM HOME W/SPOUSE.SPOUSE RECEIVES [facility]-24HRS TEL#[phone]   Anticipated DC Date:  05/10/2012   Anticipated DC Plan:  HOME W HOME HEALTH SERVICES         Choice offered to / List presented to:             Status of service:  In process, will continue to follow Medicare Important Message given?   (If response is "NO", the following Medicare IM given date fields will be blank) Date Medicare IM given:   Date Additional Medicare IM given:    Discharge Disposition:    Per UR Regulation:  Reviewed for med. necessity/level of care/duration of stay  If discussed at Long Length of Stay Meetings, dates discussed:    Comments:  05/07/12 Najeh Credit RN,BSN NCM [phone] R ORIF TODAY.AWAIT PT/OT RECOMMENDATIONS.

## 2012-05-07 NOTE — Progress Notes (Signed)
PATIENT ID: [name]        Subjective: feeling ok, ankle a little sore.  Objective:  Filed Vitals:   05/07/12 0643  BP: 142/72  Pulse: 110  Temp:   Resp:      R ankle splint intact, wiggles toes, skin warm.  Labs:   [facility] 05/07/12 0427 05/06/12 0452 05/05/12 1330  HGB 12.2* 12.6* 13.4   Basename 05/07/12 0427 05/06/12 0452  WBC 6.2 6.7  RBC 3.44* 3.57*  HCT 34.1* 35.9*  PLT 121* 121*   Basename 05/07/12 0427 05/06/12 0452  NA 135 136  K 4.2 4.5  CL 102 101  CO2 26 26  BUN 30* 34*  CREATININE 1.40* 1.55*  GLUCOSE 144* 142*  CALCIUM 10.5 10.4    Assessment and Plan:R ankle fracture Plan ORIF this afternoon if cleared by medical team for surgery, likely around 4pm NPO now

## 2012-05-07 NOTE — Preoperative (Signed)
Beta Blockers   Reason not to administer Beta Blockers:Not Applicable 

## 2012-05-07 NOTE — Anesthesia Postprocedure Evaluation (Signed)
  Anesthesia Post-op Note  Patient: [name]  Procedure(s) Performed: Procedure(s) (LRB): OPEN REDUCTION INTERNAL FIXATION (ORIF) ANKLE FRACTURE (Right)  Patient Location: PACU  Anesthesia Type: General  Level of Consciousness: awake and alert   Airway and Oxygen Therapy: Patient Spontanous Breathing  Post-op Pain: mild  Post-op Assessment: Post-op Vital signs reviewed, Patient's Cardiovascular Status Stable, Respiratory Function Stable, Patent Airway and No signs of Nausea or vomiting  Last Vitals:  Filed Vitals:   05/07/12 1700  BP: 182/92  Pulse: 93  Temp:   Resp: 14    Post-op Vital Signs: stable   Complications: No apparent anesthesia complications

## 2012-05-07 NOTE — Anesthesia Preprocedure Evaluation (Signed)
Anesthesia Evaluation  Patient identified by MRN, date of birth, ID band Patient awake    Reviewed: Allergy & Precautions, H&P , NPO status , Patient's Chart, lab work & pertinent test results  Airway Mallampati: II TM Distance: >3 FB Neck ROM: Full    Dental No notable dental hx.    Pulmonary Current Smoker,  Small cell lung ca breath sounds clear to auscultation  Pulmonary exam normal       Cardiovascular hypertension, Pt. on medications negative cardio ROS  Rhythm:Regular Rate:Normal     Neuro/Psych negative neurological ROS  negative psych ROS   GI/Hepatic negative GI ROS, Neg liver ROS,   Endo/Other  negative endocrine ROS  Renal/GU Renal InsufficiencyRenal disease  negative genitourinary   Musculoskeletal negative musculoskeletal ROS (+)   Abdominal   Peds negative pediatric ROS (+)  Hematology  (+) Blood dyscrasia, , thrombocytopenia   Anesthesia Other Findings   Reproductive/Obstetrics negative OB ROS                           Anesthesia Physical Anesthesia Plan  ASA: III  Anesthesia Plan: General   Post-op Pain Management:    Induction: Intravenous  Airway Management Planned: LMA and Oral ETT  Additional Equipment:   Intra-op Plan:   Post-operative Plan:   Informed Consent: I have reviewed the patients History and Physical, chart, labs and discussed the procedure including the risks, benefits and alternatives for the proposed anesthesia with the patient or authorized representative who has indicated his/her understanding and acceptance.   Dental advisory given  Plan Discussed with: CRNA and Surgeon  Anesthesia Plan Comments:         Anesthesia Quick Evaluation

## 2012-05-07 NOTE — Progress Notes (Signed)
Subjective: Patient is a pleasant 71 year old African American male with extensive stage small cell lung cancer. He is status post 2 cycles of systemic chemotherapy with carboplatin and etoposide discontinued secondary to disease progression, currently being treated with systemic chemotherapy in the form of cisplatin and irinotecan, status post 2 cycles. Patient admitted with syncopal episode secondary to hypotension, sustained right ankle fracture in syncopal episode. To have orthopedic surgical repair later today. Other than pain from the ankle fracture, he voiced no other complaints.  Objective: Vital signs in last 24 hours: Temp:  [98.2 F (36.8 C)-98.7 F (37.1 C)] 98.6 F (37 C) (11/25 0640) Pulse Rate:  [90-110] 110  (11/25 0643) Resp:  [18-20] 18  (11/25 0640) BP: (114-152)/(72-83) 142/72 mmHg (11/25 0643) SpO2:  [95 %-98 %] 98 % (11/25 0640) Weight:  [219 lb 12.8 oz (99.7 kg)] 219 lb 12.8 oz (99.7 kg) (11/25 0640)  Intake/Output from previous day: 11/24 0701 - 11/25 0700 In: 600 [P.O.:600] Out: 700 [Urine:700] Intake/Output this shift:    General appearance: alert, cooperative, appears stated age and no distress Resp: clear to auscultation bilaterally Cardio: occ. ectopic beat, + SEM, normal S1, S2 GI: soft, non-tender; bowel sounds normal; no masses,  no organomegaly Extremities: left LE no edema, RLE splinted anad wrapped  Lab Results:   [facility] 05/07/12 0427 05/06/12 0452  WBC 6.2 6.7  HGB 12.2* 12.6*  HCT 34.1* 35.9*  PLT 121* 121*   BMET  Basename 05/07/12 0427 05/06/12 0452  NA 135 136  K 4.2 4.5  CL 102 101  CO2 26 26  GLUCOSE 144* 142*  BUN 30* 34*  CREATININE 1.40* 1.55*  CALCIUM 10.5 10.4    Studies/Results: Dg Chest 2 View  05/05/2012  *RADIOLOGY REPORT*  Clinical Data: Lung cancer, syncope, weakness  CHEST - 2 VIEW  Comparison: 03/09/2012, 01/27/2012, 01/18/2012  Findings: Nodular opacities in the right lower lobe noted compatible with known  lung cancer.  Compared to 01/18/2012, there is slight improvement in the right lower lobe aeration compatible with resolving pneumonia or postobstructive process.  No large effusion or pneumothorax.  Left lung remains relatively clear. Stable heart size and vascularity.  Trachea is midline.  Slight pleural thickening on the right, stable.  IMPRESSION: Right lower lobe nodular opacities compatible with known lung cancer.  Improving right lower lobe aeration compatible with resolving pneumonia/post obstructive pneumonitis.   Original Report Authenticated By: Judie Petit. Shick, M.D.    Dg Ankle Complete Right  05/05/2012  *RADIOLOGY REPORT*  Clinical Data: Fall with right ankle injury.  RIGHT ANKLE - COMPLETE 3+ VIEW  Comparison: None.  Findings: Mildly displaced oblique fracture is present through the distal fibula that extends into the ankle mortise.  There is associated mild subluxation of the talar dome laterally with widening of the medial mortise.  In the lateral projection, there is potentially a subtle nondisplaced posterior malleolar fracture. However, this may be overlap of the lateral malleolar fracture rather than a separate fracture.  IMPRESSION: Mildly displaced fracture of the distal fibula with associated mild talar subluxation laterally.  There may also be a subtle component of a posterior malleolar fracture.   Original Report Authenticated By: Irish Lack, M.D.     Medications: I have reviewed the patient's current medications.  Assessment/Plan: Pleasant 71 year old African American male with extensive stage small cell lung cancer, status post 2 cycles of chemotherapy with carboplatin/etposide discontinued secondary to disease progression, now being treated with chemotherapy in the form of cisplatin  and irinotecan, status post 2 cycles. Admitted with syncopal episodes and sustained right ankle fracture, to have surgical correction later today. Patient due for next cycle of chemotherapy 05/16/12,  however in view of current events this will be postponed until he recovers from his surgery.   LOS: 2 days    Conni Slipper, PA-C 05/07/2012 9:00 AM  Hematology/oncology attending: The patient is seen and examined. I agree with the above note. He has a history of extensive stage small cell lung cancer and currently undergoing systemic chemotherapy with cisplatin and irinotecan status post 2 cycles. The patient has been doing fine except for increasing fatigue and he recently fell down at home and broke his distal fibula. He scheduled for surgery later on today. He is doing fine otherwise with no specific complaints.  Continue current supportive care. We'll follow up the patient with you on as needed basis.

## 2012-05-07 NOTE — Progress Notes (Signed)
[facility] PROGRESS NOTE  Uziel Covault WUJ:[id number] DOB: November 16, 1940 DOA: 05/05/2012 PCP: [name], MD  Assessment/Plan: 1-Syncope and collapse: Patient syncope episode most likely 2/2 to orthostasis; no arrhythmias on telemetry and his 2-d echo demonstrating just grade 1 diastolic heart failure. -Will discontinue telemetry  -cardiac enzymes, TSH, phosphorus, Mg, B12 level and a urinalysis WNL.  -2-D echo demonstrating diastolic heart failure grade 1 and no further significant abnormalities. EF preserved and no wall motion abnormalities. Aortic stenosis as cause for his murmur, but with good gradient and no needs for further inpatient work up. -Will change IVF to [facility]; continue holding antihypertensive agents for now.  -Cortisol level WNL. -PT/OT after surgery to determine discharge plans. -Continue heart healthy diet (at discharge will resume BP meds at a lower dose)  2-Small cell lung carcinoma: Patient receiving active chemotherapy treatment at this moment. Dr. Shirline Frees who is his cancer doctor has been notified of the patient's admission. Will follow his recommendations.   3-HTN (hypertension): Stable and rising now into the 140's range. Will change IVF's to [facility]. But will continue holding antihypertensive drugs for now. Most likely will resume them at a lower dose at discharge.  4-CKD (chronic kidney disease) stage 3, GFR 30-59 ml/min: Creatinine 1.4; appears to be at baseline for him. Will follow kidney function. Continue holding ARB's/HCTZ for now. IVF's now changed to [facility]  5-GERD: Continue PPI.   6-Thrombocytopenia: No signs of overt bleeding. Most likely secondary to ongoing chemotherapy. Avoid heparin products. Will continue SCDs for DVT prophylaxis.   7-Heart murmur: secondary to aortic stenosis per 2-D echo. Gradient stable and no further inpatient work up needed for this.   8-Right ankle pain: Patient reports that he twisted his ankle during the fall at the  moment that he had the syncope event. Ankle x-ray demonstrated Mildly displaced fracture of the distal fibula with associated mild talar subluxation laterally. Will continue splint, no weight bearing and use PRN pain meds. Orthopedic service has been contacted (Dr. Ave Filter) and the plan is for surgical stabilization of the fracture either on 11/25. Patient is medically stable and clear for surgery today.   9-Weakness: multifactorial; and worsen with chemotherapy. Will ask PT and OT to see him after surgery and provide recommendations  10-Diastolic heart failure: grade 1 per 2-D echo and with preserved EF. Will resume low dose diuretics at discharge; patient will follow heart healthy diet. No signs of fluid overload.  DVT: SCD's   Code Status: Full Family Communication: no family at bedside Disposition Plan: Patient will like to go home when medically stable; after surgery will ask PT to evaluate and provide recommendations.   Consultants:  Ortho (Dr. Ave Filter)  Oncology (Dr. Arbutus Ped)  Procedures:  2-D echo (demonstrating preserved EF and just grade 1 diastolic dysfunction)  Right ankle x-ray (see complete report below; but positive for acute mildly displaced fx)  Antibiotics:  none  HPI/Subjective: Afebrile; no CP, no SOB, no palpitations; denies any further lightheadedness or dizziness. No orthostatic changes this morning, patient asymptomatic.  Objective: Filed Vitals:   05/06/12 2127 05/07/12 0640 05/07/12 0641 05/07/12 0643  BP: 144/83 124/78 114/75 142/72  Pulse: 95 90 102 110  Temp: 98.7 F (37.1 C) 98.6 F (37 C)    TempSrc: Oral Oral    Resp: 20 18    Height:      Weight:  99.7 kg (219 lb 12.8 oz)    SpO2: 95% 98%      Intake/Output Summary (Last 24 hours) at  05/07/12 1157 Last data filed at 05/07/12 1610  Gross per 24 hour  Intake    420 ml  Output    700 ml  Net   -280 ml   Filed Weights   05/05/12 1155 05/06/12 0506 05/07/12 0640  Weight: 113.399  kg (250 lb) 108.773 kg (239 lb 12.8 oz) 99.7 kg (219 lb 12.8 oz)    Exam:   General: NAD; afebrile; cooperative to examination; AAOX3  Cardiovascular: + SEM; no rubs or gallops, S1 and S2  Respiratory: CTA  Abdomen: soft, no tender, positive BS  Extremities: right ankle swelling and pain; otherwise no abnormalities appreciated  Neuro: AAOX3; no focal deficit; CN intact  Data Reviewed: Basic Metabolic Panel:  Lab 05/07/12 9604 05/06/12 0452 05/05/12 1651 05/05/12 1330 05/01/12 0921  NA 135 136 -- 136 140  K 4.2 4.5 -- 4.6 4.5  CL 102 101 -- 102 109*  CO2 26 26 -- 27 24  GLUCOSE 144* 142* -- 109* 164*  BUN 30* 34* -- 38* 31.0*  CREATININE 1.40* 1.55* -- 1.63* 1.7*  CALCIUM 10.5 10.4 -- 10.5 10.3  MG -- -- 1.8 -- 1.6  PHOS -- -- 3.5 -- --   Liver Function Tests:  Lab 05/05/12 1651 05/05/12 1330 05/01/12 0921  AST 18 18 23   ALT 17 16 22   ALKPHOS 67 66 71  BILITOT 0.5 0.4 0.40  PROT 7.0 6.9 6.6  ALBUMIN 3.7 3.5 3.5   CBC:  Lab 05/07/12 0427 05/06/12 0452 05/05/12 1330 05/01/12 0920  WBC 6.2 6.7 4.8 3.9*  NEUTROABS -- -- -- 2.2  HGB 12.2* 12.6* 13.4 13.5  HCT 34.1* 35.9* 37.9* 38.8  MCV 99.1 100.6* 99.5 100.5*  PLT 121* 121* 139* 152   Cardiac Enzymes:  Lab 05/06/12 0101 05/05/12 2021 05/05/12 1330  CKTOTAL -- -- --  CKMB -- -- --  CKMBINDEX -- -- --  TROPONINI <0.30 <0.30 <0.30    Studies: Dg Chest 2 View  05/05/2012  *RADIOLOGY REPORT*  Clinical Data: Lung cancer, syncope, weakness  CHEST - 2 VIEW  Comparison: 03/09/2012, 01/27/2012, 01/18/2012  Findings: Nodular opacities in the right lower lobe noted compatible with known lung cancer.  Compared to 01/18/2012, there is slight improvement in the right lower lobe aeration compatible with resolving pneumonia or postobstructive process.  No large effusion or pneumothorax.  Left lung remains relatively clear. Stable heart size and vascularity.  Trachea is midline.  Slight pleural thickening on the right,  stable.  IMPRESSION: Right lower lobe nodular opacities compatible with known lung cancer.  Improving right lower lobe aeration compatible with resolving pneumonia/post obstructive pneumonitis.   Original Report Authenticated By: Judie Petit. Shick, M.D.    Dg Ankle Complete Right  05/05/2012  *RADIOLOGY REPORT*  Clinical Data: Fall with right ankle injury.  RIGHT ANKLE - COMPLETE 3+ VIEW  Comparison: None.  Findings: Mildly displaced oblique fracture is present through the distal fibula that extends into the ankle mortise.  There is associated mild subluxation of the talar dome laterally with widening of the medial mortise.  In the lateral projection, there is potentially a subtle nondisplaced posterior malleolar fracture. However, this may be overlap of the lateral malleolar fracture rather than a separate fracture.  IMPRESSION: Mildly displaced fracture of the distal fibula with associated mild talar subluxation laterally.  There may also be a subtle component of a posterior malleolar fracture.   Original Report Authenticated By: Irish Lack, M.D.     Scheduled Meds:    . folic  acid  1 mg Oral Daily  . pantoprazole  40 mg Oral Q1200  . sodium chloride  3 mL Intravenous Q12H  . vitamin B-12  1,000 mcg Oral Daily   Continuous Infusions:    . sodium chloride    . [DISCONTINUED] sodium chloride 125 mL/hr (05/07/12 0813)  . [DISCONTINUED] sodium chloride 20 mL/hr (05/06/12 1215)    Time spent: >30 minutes    Caleesi Kohl  [facility] Pager [phone]. If 8PM-8AM, please contact night-coverage at [email or web address], password [facility] 05/07/2012, 11:57 AM  LOS: 2 days

## 2012-05-07 NOTE — Op Note (Signed)
Procedure(s): OPEN REDUCTION INTERNAL FIXATION (ORIF) ANKLE FRACTURE Procedure Note  [name] male 71 y.o. 05/07/2012  Procedure(s) and Anesthesia Type:    * OPEN REDUCTION INTERNAL FIXATION (ORIF) right lateral malleolus ANKLE FRACTURE - General  Surgeon(s) and Role:    * Mable Paris, MD - Primary   Indications:  71 y.o. male s/p fall with right ankle fracture. Indicated for surgery to promote anatomic restoration of joint.     Surgeon: Mable Paris   Assistants: Damita Lack PA-C [facility] was present and scrubbed throughout the procedure and was essential in positioning, retraction, exposure, and closure)  Anesthesia: General endotracheal anesthesia    Procedure Detail  OPEN REDUCTION INTERNAL FIXATION (ORIF) ANKLE FRACTURE  Findings: Anatomic reduction using one 3.5 mm anterior to posterior lag screw, a 7 hole one third tubular plate laterally with 3 proximal cortical screws and 2 distal unicortical cancellus screws the  Estimated Blood Loss:  Minimal         Drains: none  Blood Given: none         Specimens: none        Complications:  * No complications entered in OR log *         Disposition: PACU - hemodynamically stable.         Condition: stable    Procedure:  The patient was identified in the preoperative  holding area where I personally marked the operative site after  verifying site side and procedure with the patient. The patient was taken back  to the operating room where general anesthesia was induced without  Complication. The patient was placed in supine position with a bump under the operative hip. A non sterile tourniquet was applied to the thigh. The patient did receive IV antibiotics prior to the incision.   After the appropriate time-out, the limb was exsanguinated and the tourniquet was elevated to 300 mmHg.   A lateral incision was made over the fracture site and dissection was carried down the lateral  fibula.  The fracture was a exposed and cleaned of hematoma.  Reduction was carried out using reduction forceps and manipulation of the ankle.  An interfragmentary lag screw was placed.  A 7 hole plate was laid laterally and felt to be appropriate sized.  Holes proximal and distal to the fracture were sequentially drill, measured and filled with appropriate sized bicortical and unicortical screws.  Appropriate length and alignment were verified on fluoroscopic imaging.  There is a small fragment distally off of the anterior fragment which was rotated initially. It measured about 1 cm in length. Once the fracture was anatomically reduced and placed back into its anatomic position.   The syndesmosis was stressed and felt to be stable.  The wounds were then copiously irrigated and closed in layers with 2-0 vicryl in a deep layer and 3-0 Vicryl and 4-0 Monocryl for skin closure.  Sterile dressings were then applied and well padded well molded splint in a plantigrade position was applied.  The tourniquet was let down for total tourniquet time of 50 minutes.  The patient was then allowed to awaken from GA, taken to the PACU in stable condition.  POSTOPERATIVE PLAN: The patient will be non-weightbearing on the operative Extremity and will follow up in 10-14 days for wound check. He will have Lovenox for DVT prophylaxis while in the hospital and transition to aspirin after discharge.

## 2012-05-07 NOTE — Transfer of Care (Signed)
Immediate Anesthesia Transfer of Care Note  Patient: [name]  Procedure(s) Performed: Procedure(s) (LRB) with comments: OPEN REDUCTION INTERNAL FIXATION (ORIF) ANKLE FRACTURE (Right)  Patient Location: PACU  Anesthesia Type:General  Level of Consciousness: awake and sedated  Airway & Oxygen Therapy: Patient Spontanous Breathing and Patient connected to face mask oxygen  Post-op Assessment: Report given to PACU RN and Post -op Vital signs reviewed and stable  Post vital signs: Reviewed and stable  Complications: No apparent anesthesia complications

## 2012-05-08 ENCOUNTER — Other Ambulatory Visit: Payer: Medicare Other

## 2012-05-08 LAB — COMPREHENSIVE METABOLIC PANEL
ALT: 12 U/L (ref 0–53)
Alkaline Phosphatase: 59 U/L (ref 39–117)
CO2: 26 mEq/L (ref 19–32)
Chloride: 102 mEq/L (ref 96–112)
GFR calc Af Amer: 57 mL/min — ABNORMAL LOW (ref >90)
GFR calc non Af Amer: 49 mL/min — ABNORMAL LOW (ref >90)
Glucose, Bld: 153 mg/dL — ABNORMAL HIGH (ref 70–99)
Potassium: 4.3 mEq/L (ref 3.5–5.1)
Sodium: 135 mEq/L (ref 135–145)
Total Bilirubin: 0.5 mg/dL (ref 0.3–1.2)
Total Protein: 6.7 g/dL (ref 6.0–8.3)

## 2012-05-08 MED ORDER — OXYCODONE-ACETAMINOPHEN 5-325 MG PO TABS: 1.0000 | ORAL_TABLET | ORAL | Status: DC | PRN

## 2012-05-08 MED ORDER — ASPIRIN EC 325 MG PO TBEC: 325.0000 mg | DELAYED_RELEASE_TABLET | Freq: Two times a day (BID) | ORAL | Status: DC

## 2012-05-08 NOTE — Progress Notes (Signed)
PATIENT ID: [name]   1 Day Post-Op Procedure(s) (LRB): OPEN REDUCTION INTERNAL FIXATION (ORIF) ANKLE FRACTURE (Right)  Subjective: Feeling well today. Reports some soreness of Right ankle. No complaints or concerns. Has not yet worked with PT.  Objective:  Filed Vitals:   05/08/12 0438  BP: 146/78  Pulse: 92  Temp: 98.2 F (36.8 C)  Resp:      R LE splint c/d/i Wiggles toes, intact sensation to light touch Distally NVI   Labs:   [facility] 05/07/12 0427 05/06/12 0452 05/05/12 1330  HGB 12.2* 12.6* 13.4   Basename 05/07/12 0427 05/06/12 0452  WBC 6.2 6.7  RBC 3.44* 3.57*  HCT 34.1* 35.9*  PLT 121* 121*   Basename 05/08/12 0450 05/07/12 0427  NA 135 135  K 4.3 4.2  CL 102 102  CO2 26 26  BUN 25* 30*  CREATININE 1.40* 1.40*  GLUCOSE 153* 144*  CALCIUM 10.2 10.5    Assessment and Plan: 1 day s/p ORIF right ankle fracture Will work with PT today NWB RLE Okay to dc home when medically stable Fu with Dr. Ave Filter 10 days ASA 325mg  BID and Percocet scripts in chart   VTE proph: enoxaparin 40mg  q 24 hrs, SCD

## 2012-05-08 NOTE — Progress Notes (Signed)
[facility] PROGRESS NOTE  Bolivar Koranda ZOX:[id number] DOB: [birth year] DOA: 05/05/2012 PCP: [name], MD  Assessment/Plan: 1-Syncope and collapse: Patient syncope episode most likely 2/2 to orthostasis; no arrhythmias on telemetry and his 2-D echo demonstrating just grade 1 diastolic heart failure. -Will discontinue telemetry at this point -cardiac enzymes, TSH, phosphorus, Mg, B12 level and a urinalysis WNL.  -2-D echo demonstrating diastolic heart failure grade 1 and no further significant abnormalities. EF preserved and no wall motion abnormalities. Aortic stenosis as cause for his murmur, but with good gradient and no needs for further inpatient work up. -Will change IVF to [facility]; continue holding antihypertensive agents for now.  -Cortisol level WNL. -needs CIR or SNF due to physical deconditioning and status post right ankle ORIF -Continue heart healthy diet (at discharge will resume BP meds at a lower dose)  2-Small cell lung carcinoma: Patient receiving active chemotherapy treatment at this moment. Dr. Shirline Frees who is his cancer doctor has been notified of the patient's admission. Will follow his recommendations.   3-HTN (hypertension): Stable and rising now into the 140's range. Will change IVF's to [facility]. But will continue holding antihypertensive drugs for now. Most likely will resume them at a lower dose at discharge.  4-CKD (chronic kidney disease) stage 3, GFR 30-59 ml/min: Creatinine 1.4; appears to be at baseline for him. Will follow kidney function. Continue holding ARB's/HCTZ for now. IVF's now changed to [facility]  5-GERD: Continue PPI.   6-Thrombocytopenia: No signs of overt bleeding. Most likely secondary to ongoing chemotherapy. Avoid long term use of heparin products.   7-Heart murmur: secondary to aortic stenosis per 2-D echo. Gradient stable and no further inpatient work up needed for this.   8-Right ankle pain: Patient reports that he twisted his ankle  during the fall at the moment that he had the syncope event. Ankle x-ray demonstrated Mildly displaced fracture of the distal fibula with associated mild talar subluxation laterally.  S/p ORIF of his right ankle. Will follow ortho recommendations. (no WB for 10-14 days, ASA for DVT prophylaxis after discharge from the hospital and percocet PRN for pain).  9-Weakness/physical deconditioning: multifactorial (malignancy, fracture, dehydration); and worsen with chemotherapy. Per PT recommendations he needs 24/7 care and assistance especially now after surgery when he can not bear weight on his RLE. Will ask CIR and if not a candidate for inpatient rehab he will need short term SNF placement. Plan of care discussed with patient an his son.  10-Diastolic heart failure: grade 1 per 2-D echo and with preserved EF. Will resume low dose diuretics at discharge; patient will follow heart healthy diet. No signs of fluid overload.  DVT: SCD's   Code Status: Full Family Communication: Son by phone Salley Scarlet [phone]) Disposition Plan: CIR vs SNF short term for physical rehab   Consultants:  Ortho (Dr. Ave Filter)  Oncology (Dr. Arbutus Ped)  Procedures:  2-D echo (demonstrating preserved EF and just grade 1 diastolic dysfunction)  Right ankle x-ray (see complete report below; but positive for acute mildly displaced fx)  Antibiotics:  none  HPI/Subjective: Afebrile; no CP, no SOB, no palpitations; denies any further lightheadedness or dizziness. Patient asymptomatic. Patient w/o 24 hours support and assistance at home and will most likely needs ST placements or CIR.   Objective: Filed Vitals:   05/07/12 2248 05/08/12 0206 05/08/12 0438 05/08/12 1320  BP: 143/83 149/76 146/78 156/88  Pulse: 89 88 92 92  Temp: 98.3 F (36.8 C) 98.5 F (36.9 C) 98.2 F (36.8 C) 98.2 F (  36.8 C)  TempSrc: Oral Oral Oral Oral  Resp: 18 18  16   Height:      Weight:   110.1 kg (242 lb 11.6 oz)   SpO2: 100% 99%  100% 100%    Intake/Output Summary (Last 24 hours) at 05/08/12 1504 Last data filed at 05/08/12 1455  Gross per 24 hour  Intake   2410 ml  Output   1550 ml  Net    860 ml   Filed Weights   05/06/12 0506 2012/06/04 0640 05/08/12 0438  Weight: 108.773 kg (239 lb 12.8 oz) 99.7 kg (219 lb 12.8 oz) 110.1 kg (242 lb 11.6 oz)    Exam:   General: NAD; afebrile; cooperative to examination; AAOX3  Cardiovascular: + SEM; no rubs or gallops, S1 and S2  Respiratory: CTA  Abdomen: soft, no tender, positive BS  Extremities: right ankle still with swelling and mild pain; otherwise no abnormalities appreciated  Neuro: AAOX3; no focal deficit; CN intact  Data Reviewed: Basic Metabolic Panel:  Lab 05/08/12 1610 June 04, 2012 0427 05/06/12 0452 05/05/12 1651 05/05/12 1330  NA 135 135 136 -- 136  K 4.3 4.2 4.5 -- 4.6  CL 102 102 101 -- 102  CO2 26 26 26  -- 27  GLUCOSE 153* 144* 142* -- 109*  BUN 25* 30* 34* -- 38*  CREATININE 1.40* 1.40* 1.55* -- 1.63*  CALCIUM 10.2 10.5 10.4 -- 10.5  MG -- -- -- 1.8 --  PHOS -- -- -- 3.5 --   Liver Function Tests:  Lab 05/08/12 0450 05/05/12 1651 05/05/12 1330  AST 14 18 18   ALT 12 17 16   ALKPHOS 59 67 66  BILITOT 0.5 0.5 0.4  PROT 6.7 7.0 6.9  ALBUMIN 3.2* 3.7 3.5   CBC:  Lab 2012-06-04 0427 05/06/12 0452 05/05/12 1330  WBC 6.2 6.7 4.8  NEUTROABS -- -- --  HGB 12.2* 12.6* 13.4  HCT 34.1* 35.9* 37.9*  MCV 99.1 100.6* 99.5  PLT 121* 121* 139*   Cardiac Enzymes:  Lab 05/06/12 0101 05/05/12 2021 05/05/12 1330  CKTOTAL -- -- --  CKMB -- -- --  CKMBINDEX -- -- --  TROPONINI <0.30 <0.30 <0.30    Studies: Dg Ankle 2 Views Right  06/04/2012  *RADIOLOGY REPORT*  Clinical Data: Right ankle plate and screw placement  DG C-ARM 1-60 MIN - NRPT [facility], RIGHT ANKLE - 2 VIEW  Comparison: Right ankle radiographs - 05/05/2012  Findings:  Two spot intraoperative fluoroscopic images of the right ankle are provided for review.  The patient has undergone side  plate fixation of previously noted displaced distal fibular metadiaphysis fracture.  There is an additional crossing cancellous screws seen at the fracture site.  Alignment appears anatomic.  The ankle mortise is preserved.  There is a minimal amount of soft tissue swelling about the operative site.  No radiopaque foreign body.  IMPRESSION: Post ORIF of distal fibular fracture without complicating feature.   Original Report Authenticated By: Tacey Ruiz, MD    Dg C-arm 1-60 Min-no Report  Jun 04, 2012  *RADIOLOGY REPORT*  Clinical Data: Right ankle plate and screw placement  DG C-ARM 1-60 MIN - [facility]PT [facility], RIGHT ANKLE - 2 VIEW  Comparison: Right ankle radiographs - 05/05/2012  Findings:  Two spot intraoperative fluoroscopic images of the right ankle are provided for review.  The patient has undergone side plate fixation of previously noted displaced distal fibular metadiaphysis fracture.  There is an additional crossing cancellous screws seen at the fracture site.  Alignment appears  anatomic.  The ankle mortise is preserved.  There is a minimal amount of soft tissue swelling about the operative site.  No radiopaque foreign body.  IMPRESSION: Post ORIF of distal fibular fracture without complicating feature.   Original Report Authenticated By: Tacey Ruiz, MD     Scheduled Meds:    . [COMPLETED] ceFAZolin  2 g Intravenous Once  . [COMPLETED]  ceFAZolin (ANCEF) IV  2 g Intravenous Q6H  . [EXPIRED] dextrose 5 % and 0.45 % NaCl with KCl 20 mEq/L      . enoxaparin (LOVENOX) injection  40 mg Subcutaneous Q24H  . folic acid  1 mg Oral Daily  . [EXPIRED] HYDROmorphone      . pantoprazole  40 mg Oral Q1200  . sodium chloride  3 mL Intravenous Q12H  . vitamin B-12  1,000 mcg Oral Daily   Continuous Infusions:    . sodium chloride 20 mL/hr (05/07/12 1219)  . dextrose 5 % and 0.45 % NaCl with KCl 20 mEq/L 100 mL/hr at 05/07/12 1736    Time spent: >30 minutes    Oneika Simonian  [facility] Pager [phone]. If 8PM-8AM, please contact night-coverage at [email or web address], password [facility] 05/08/2012, 3:04 PM  LOS: 3 days

## 2012-05-08 NOTE — Evaluation (Signed)
Physical Therapy Evaluation Patient Details Name: [name] MRN: [id number] DOB: May 10, 1941 Today's Date: 05/08/2012 Time: 1478-2956 PT Time Calculation (min): 27 min  PT Assessment / Plan / Recommendation Clinical Impression  Pt. was admitted 11/23 after a fall and LOC. and sustained a  R ankle fx. Pt. is S/P ORIF on 05/07/12. Pt. will benefit from PT while in acute care. Recommend post acute rehab. Pt. may be candidate for CIR.    PT Assessment  Patient needs continued PT services    Follow Up Recommendations  CIR;Supervision/Assistance - 24 hour    Does the patient have the potential to tolerate intense rehabilitation      Barriers to Discharge Decreased caregiver support      Equipment Recommendations  Rolling walker with 5" wheels    Recommendations for Other Services OT consult;Rehab consult , spoke to pt. about post acute rehab and is agreeable.  Frequency Min 5X/week    Precautions / Restrictions Precautions Precautions: Fall Restrictions Weight Bearing Restrictions: Yes RLE Weight Bearing: Non weight bearing   Pertinent Vitals/Pain       Mobility  Bed Mobility Bed Mobility: Supine to Sit Supine to Sit: 4: Min assist;HOB elevated;With rails Details for Bed Mobility Assistance: VC on how to turn body and push obto elbow. Transfers Transfers: Sit to Stand;Stand to Sit;Stand Pivot Transfers Sit to Stand: 1: +2 Total assist;From bed;From elevated surface;With upper extremity assist Sit to Stand: Patient Percentage: 60% Stand to Sit: 3: Mod assist;To chair/3-in-1;1: +2 Total assist Stand to Sit: Patient Percentage: 70% Stand Pivot Transfers: 1: +2 Total assist Stand Pivot Transfers: Patient Percentage: 60% Details for Transfer Assistance: pt was able to stand  pt took 2 hopping steps with RW then reached to armrest and turned to pivot on Lleg to complete transfer. Pt. able to reain NWB with R foot lightly on floor. Ambulation/Gait Ambulation/Gait  Assistance: Not tested (comment)    Shoulder Instructions     Exercises     PT Diagnosis: Difficulty walking;Acute pain  PT Problem List: Decreased strength;Decreased range of motion;Decreased activity tolerance;Decreased mobility;Decreased safety awareness;Decreased knowledge of use of DME;Decreased knowledge of precautions;Pain PT Treatment Interventions: DME instruction;Gait training;Functional mobility training;Therapeutic activities;Therapeutic exercise;Patient/family education   PT Goals Acute Rehab PT Goals PT Goal Formulation: With patient Time For Goal Achievement: 05/22/12 Potential to Achieve Goals: Good Pt will go Supine/Side to Sit: with modified independence PT Goal: Supine/Side to Sit - Progress: Goal set today Pt will go Sit to Supine/Side: with modified independence PT Goal: Sit to Supine/Side - Progress: Goal set today Pt will go Sit to Stand: with min assist PT Goal: Sit to Stand - Progress: Goal set today Pt will go Stand to Sit: with supervision PT Goal: Stand to Sit - Progress: Goal set today Pt will Transfer Bed to Chair/Chair to Bed: with min assist PT Transfer Goal: Bed to Chair/Chair to Bed - Progress: Goal set today Pt will Ambulate: 16 - 50 feet;with min assist;with rolling walker PT Goal: Ambulate - Progress: Goal set today Pt will Perform Home Exercise Program: with supervision, verbal cues required/provided PT Goal: Perform Home Exercise Program - Progress: Goal set today  Visit Information  Last PT Received On: 05/08/12 Assistance Needed: +2    Subjective Data  Subjective: i need to get up Patient Stated Goal: i will get someone to come in   Prior Functioning  Home Living Lives With: Spouse Available Help at Discharge: Family Type of Home: House Home Access: Stairs to enter [employer]  of Steps: 1 Home Layout: One level Bathroom Shower/Tub: Health visitor: Standard Home Adaptive Equipment: Grab bars in  shower;Shower chair with back Prior Function Level of Independence: Independent Driving: Yes Communication Communication: No difficulties    Cognition  Overall Cognitive Status: Difficult to assess Arousal/Alertness: Awake/alert Orientation Level: Time;Disoriented to Behavior During Session: [facility] for tasks performed    Extremity/Trunk Assessment Right Upper Extremity Assessment RUE ROM/Strength/Tone: [facility] for tasks assessed Left Upper Extremity Assessment LUE ROM/Strength/Tone: WFL for tasks assessed Right Lower Extremity Assessment RLE ROM/Strength/Tone: Deficits RLE ROM/Strength/Tone Deficits: pt. is able to lift leg from bed.in [facility] RLE Sensation: WFL - Light Touch Left Lower Extremity Assessment LLE ROM/Strength/Tone: WFL for tasks assessed Trunk Assessment Trunk Assessment: Normal   Balance Static Sitting Balance Static Sitting - Level of Assistance: 5: Stand by assistance  End of Session PT - End of Session Activity Tolerance: Patient tolerated treatment well;Patient limited by pain Patient left: in chair;with call bell/phone within reach;with nursing in room Nurse Communication: Mobility status  GP     Rada Hay 05/08/2012, 2:42 PM  [phone]

## 2012-05-08 NOTE — Consult Note (Signed)
Physical Medicine and Rehabilitation Consult Reason for Consult: Right lateral malleolus fracture Referring Physician: [facility]   HPI: [name] is a 71 y.o. 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 Arbutus Ped. He was admitted 05/05/12 secondary to episode of syncope. By report patient had received his last chemotherapy treatment 05/01/2012 he had not been eating or drinking much. Syncopal episode was witnessed by a home health aide and also by the wife who called 911. He did not hit his head during the fall per report of wife. He did have complaints of right ankle pain. X-rays demonstrated mild displaced fibular fracture/bimalleolar ankle fracture. Orthopedic services consulted and underwent open reduction internal fixation of right lateral malleolus fracture 05/07/2012 per Dr. Ave Filter. Advised nonweightbearing right lower extremity. Placed on subcutaneous Lovenox for DVT prophylaxis. Postoperative pain management. Hospital course of echocardiogram completed during workup of syncope that showed ejection fraction 65% grade 1 diastolic dysfunction. Cardiac enzymes and troponin unremarkable. Syncopal event felt to be multi-factorial related to recent chemotherapy. Physical therapy evaluation completed 05/08/2012 with recommendations of physical medicine rehabilitation consult to consider inpatient rehabilitation services.   Review of Systems  Constitutional: Positive for malaise/fatigue.  Gastrointestinal: Positive for nausea.  Musculoskeletal: Positive for myalgias.  Neurological: Positive for dizziness and weakness.  All other systems reviewed and are negative.   Past Medical History  Diagnosis Date  . Hypertension   . ALLERGIC RHINITIS   . Malignant neoplasm of bronchus and lung, unspecified site 01/23/2012   Past Surgical History  Procedure Date  . Wrist surgery 1961    right  wrist   Family History  Problem Relation Age of Onset  . Hypertension Sister   . Hypertension Brother   . Hypertension Mother   . Coronary artery disease Mother    Social History:  reports that he quit smoking 3 days ago. His smoking use included Cigarettes. He has a 12.5 pack-year smoking history. He has never used smokeless tobacco. He reports that he does not drink alcohol or use illicit drugs. Allergies: No Known Allergies Medications Prior to Admission  Medication Sig Dispense Refill  . olmesartan-hydrochlorothiazide (BENICAR HCT) 40-12.5 MG per tablet Take 0.5 tablets by mouth daily.       Marland Kitchen PRESCRIPTION MEDICATION Inject into the vein once a week. Emend, Aloxi, Platinol and Camptosar infusions every Tuesday.      . prochlorperazine (COMPAZINE) 10 MG tablet Take 10 mg by mouth every 6 (six) hours as needed. For nausea.      . vitamin B-12 (CYANOCOBALAMIN) 1000 MCG tablet Take 1,000 mcg by mouth daily.      . [DISCONTINUED] HYDROcodone-acetaminophen (VICODIN) 5-500 MG per tablet 1/2 to 1 tablet by mouth every 6 hours as needed for pain  30 tablet  0    Home: Home Living Lives With: Spouse Available Help at Discharge: Family Type of Home: House Home Access: Stairs to enter Secretary/administrator of Steps: 1 Home Layout: One level Bathroom Shower/Tub: Health visitor: Standard Home Adaptive Equipment: Grab bars in shower;Shower chair with back  Functional History: Prior Function Driving: Yes Functional Status:  Mobility: Bed Mobility Bed Mobility: Supine to Sit Supine to Sit: 4: Min assist;HOB elevated;With rails Transfers Transfers: Sit to Stand;Stand to Sit;Stand Pivot Transfers Sit to Stand: 1: +2 Total assist;From bed;From elevated surface;With upper extremity assist Sit to Stand: Patient Percentage: 60% Stand to Sit: 3: Mod assist;To chair/3-in-1;1: +2 Total assist Stand to  Sit: Patient Percentage: 70% Stand Pivot Transfers: 1: +2 Total  assist Stand Pivot Transfers: Patient Percentage: 60% Ambulation/Gait Ambulation/Gait Assistance: Not tested (comment)    ADL:    Cognition: Cognition Arousal/Alertness: Awake/alert Orientation Level: Oriented X4 Cognition Overall Cognitive Status: Difficult to assess Arousal/Alertness: Awake/alert Orientation Level: Time;Disoriented to Behavior During Session: [facility] for tasks performed  Blood pressure 156/88, pulse 92, temperature 98.2 F (36.8 C), temperature source Oral, resp. rate 16, height 6\' 1"  (1.854 m), weight 110.1 kg (242 lb 11.6 oz), SpO2 100.00%. Physical Exam  Vitals reviewed. Constitutional: He appears well-developed and well-nourished.  HENT:  Head: Normocephalic and atraumatic.  Right Ear: External ear normal.  Left Ear: External ear normal.  Eyes: Conjunctivae normal and EOM are normal. Pupils are equal, round, and reactive to light. Right eye exhibits discharge. Left eye exhibits no discharge.       Pupils round and reactive to light  Neck: Normal range of motion. Neck supple. No JVD present. No tracheal deviation present. No thyromegaly present.  Cardiovascular: Normal rate, regular rhythm and normal heart sounds.  Exam reveals no friction rub.   No murmur heard. Pulmonary/Chest: Effort normal and breath sounds normal. No respiratory distress. He has no wheezes.  Abdominal: Soft. Bowel sounds are normal. He exhibits distension. He exhibits no mass. There is no tenderness. There is no rebound and no guarding.       Obese  Musculoskeletal:       Right LE is appropriately tender.   Lymphadenopathy:    He has no cervical adenopathy.  Neurological: He is alert. He has normal reflexes. No cranial nerve deficit or sensory deficit.       Patient named person and place. He was able to get his appropriate age followed basic commands. RLE NVI. LLE 3-4/5 prox to 5/5 distally. UE are nearly 5/5  Skin:       Right lower extremity is splinted  Psychiatric: He has a  normal mood and affect. His behavior is normal. Judgment and thought content normal.    Results for orders placed during the hospital encounter of 05/05/12 (from the past 24 hour(s))  COMPREHENSIVE METABOLIC PANEL     Status: Abnormal   Collection Time   05/08/12  4:50 AM      Component Value Range   Sodium 135  135 - 145 mEq/L   Potassium 4.3  3.5 - 5.1 mEq/L   Chloride 102  96 - 112 mEq/L   CO2 26  19 - 32 mEq/L   Glucose, Bld 153 (*) 70 - 99 mg/dL   BUN 25 (*) 6 - 23 mg/dL   Creatinine, Ser 5.62 (*) 0.50 - 1.35 mg/dL   Calcium 13.0  8.4 - 86.5 mg/dL   Total Protein 6.7  6.0 - 8.3 g/dL   Albumin 3.2 (*) 3.5 - 5.2 g/dL   AST 14  0 - 37 U/L   ALT 12  0 - 53 U/L   Alkaline Phosphatase 59  39 - 117 U/L   Total Bilirubin 0.5  0.3 - 1.2 mg/dL   GFR calc non Af Amer 49 (*) >90 mL/min   GFR calc Af Amer 57 (*) >90 mL/min   Dg Ankle 2 Views Right  05/07/2012  *RADIOLOGY REPORT*  Clinical Data: Right ankle plate and screw placement  DG C-ARM 1-60 MIN - NRPT [facility], RIGHT ANKLE - 2 VIEW  Comparison: Right ankle radiographs - 05/05/2012  Findings:  Two spot intraoperative fluoroscopic images of the right ankle are  provided for review.  The patient has undergone side plate fixation of previously noted displaced distal fibular metadiaphysis fracture.  There is an additional crossing cancellous screws seen at the fracture site.  Alignment appears anatomic.  The ankle mortise is preserved.  There is a minimal amount of soft tissue swelling about the operative site.  No radiopaque foreign body.  IMPRESSION: Post ORIF of distal fibular fracture without complicating feature.   Original Report Authenticated By: Tacey Ruiz, MD    Dg C-arm 1-60 Min-no Report  05/07/2012  *RADIOLOGY REPORT*  Clinical Data: Right ankle plate and screw placement  DG C-ARM 1-60 MIN - NRPT [facility], RIGHT ANKLE - 2 VIEW  Comparison: Right ankle radiographs - 05/05/2012  Findings:  Two spot intraoperative fluoroscopic images of the  right ankle are provided for review.  The patient has undergone side plate fixation of previously noted displaced distal fibular metadiaphysis fracture.  There is an additional crossing cancellous screws seen at the fracture site.  Alignment appears anatomic.  The ankle mortise is preserved.  There is a minimal amount of soft tissue swelling about the operative site.  No radiopaque foreign body.  IMPRESSION: Post ORIF of distal fibular fracture without complicating feature.   Original Report Authenticated By: Tacey Ruiz, MD     Assessment/Plan: Diagnosis: Right lateral malleolus fx after syncopal episode with ORIF 1. Does the need for close, 24 hr/day medical supervision in concert with the patient's rehab needs make it unreasonable for this patient to be served in a less intensive setting? Yes 2. Co-Morbidities requiring supervision/potential complications: HTN, stake 3 CKD, SCLCancer,  3. Due to bladder management, bowel management, safety, skin/wound care, disease management, medication administration, pain management and patient education, does the patient require 24 hr/day rehab nursing? Yes 4. Does the patient require coordinated care of a physician, rehab nurse, PT (1-2 hrs/day, 5 days/week) and OT (1-2 hrs/day, 5 days/week) to address physical and functional deficits in the context of the above medical diagnosis(es)? Yes Addressing deficits in the following areas: balance, endurance, locomotion, strength, transferring, bowel/bladder control, bathing, dressing, feeding, grooming, toileting and psychosocial support 5. Can the patient actively participate in an intensive therapy program of at least 3 hrs of therapy per day at least 5 days per week? Yes 6. The potential for patient to make measurable gains while on inpatient rehab is excellent 7. Anticipated functional outcomes upon discharge from inpatient rehab are mod I with PT, mod I with OT, n/a with SLP. 8. Estimated rehab length of stay to  reach the above functional goals is: 7-10 days 9. Does the patient have adequate social supports to accommodate these discharge functional goals? Yes and Potentially 10. Anticipated D/C setting: Home 11. Anticipated post D/C treatments: HH therapy 12. Overall Rehab/Functional Prognosis: excellent  RECOMMENDATIONS: This patient's condition is appropriate for continued rehabilitative care in the following setting: CIR Patient has agreed to participate in recommended program. Yes Note that insurance prior authorization may be required for reimbursement for recommended care.  Comment: Pt needs to be Mod I to return home. He is quite motivated and should do well in the inpatient rehab setting. Rehab RN to follow up. Possible admit today  Ivory Broad, MD     05/08/2012

## 2012-05-08 NOTE — Progress Notes (Signed)
Physical Therapy Treatment Patient Details Name: [name] MRN: [id number] DOB: [birth year] Today's Date: 05/08/2012 Time: 6644-0347 PT Time Calculation (min): 20 min  PT Assessment / Plan / Recommendation Comments on Treatment Session  pt. will benefit from post acute rehab. Pt's wife has caregivers at home.     Follow Up Recommendations  CIR     Does the patient have the potential to tolerate intense rehabilitation     Barriers to Discharge Decreased caregiver support      Equipment Recommendations  Rolling walker with 5" wheels    Recommendations for Other Services Rehab consult  Frequency Min 5X/week   Plan Discharge plan remains appropriate;Frequency remains appropriate    Precautions / Restrictions Precautions Precautions: Fall Restrictions Weight Bearing Restrictions: Yes RLE Weight Bearing: Non weight bearing   Pertinent Vitals/Pain 5/10 R ankle, RN aware of urinating pain.    Mobility  Bed Mobility Bed Mobility: Sit to Supine Supine to Sit: 3: Mod assist Details for Bed Mobility Assistance: support RLE onto bed. Transfers Transfers: Sit to Stand;Stand to Sit;Stand Pivot Transfers Sit to Stand: 1: +2 Total assist;With armrests;With upper extremity assist;From chair/3-in-1 Sit to Stand: Patient Percentage: 60% Stand to Sit: To bed;With upper extremity assist Stand to Sit: Patient Percentage: 60% Stand Pivot Transfers: 1: +2 Total assist Stand Pivot Transfers: Patient Percentage: 50% Details for Transfer Assistance: Pt. required constant cueing to [place] UE's on RW. pt. tended to hold onto chair arms.  Ambulation/Gait Ambulation/Gait Assistance: Not tested (comment)    Exercises     PT Diagnosis: Difficulty walking;Acute pain  PT Problem List: Decreased strength;Decreased range of motion;Decreased activity tolerance;Decreased mobility;Decreased safety awareness;Decreased knowledge of use of DME;Decreased knowledge of precautions;Pain PT Treatment  Interventions: DME instruction;Gait training;Functional mobility training;Therapeutic activities;Therapeutic exercise;Patient/family education   PT Goals Acute Rehab PT Goals PT Goal Formulation: With patient Time For Goal Achievement: 05/22/12 Potential to Achieve Goals: Good Pt will go Supine/Side to Sit: with modified independence PT Goal: Supine/Side to Sit - Progress: Goal set today Pt will go Sit to Supine/Side: with modified independence PT Goal: Sit to Supine/Side - Progress: Progressing toward goal Pt will go Sit to Stand: with min assist PT Goal: Sit to Stand - Progress: Progressing toward goal Pt will go Stand to Sit: with supervision PT Goal: Stand to Sit - Progress: Progressing toward goal Pt will Transfer Bed to Chair/Chair to Bed: with min assist PT Transfer Goal: Bed to Chair/Chair to Bed - Progress: Progressing toward goal Pt will Ambulate: 16 - 50 feet;with min assist;with rolling walker PT Goal: Ambulate - Progress: Goal set today Pt will Perform Home Exercise Program: with supervision, verbal cues required/provided PT Goal: Perform Home Exercise Program - Progress: Goal set today  Visit Information  Last PT Received On: 05/08/12 Assistance Needed: +2    Subjective Data  Subjective: Oh it hurts to try to urinate Patient Stated Goal: i will get someone to come in   Cognition  Overall Cognitive Status: Appears within functional limits for tasks assessed/performed Arousal/Alertness: Awake/alert Orientation Level: Time;Disoriented to Behavior During Session: [facility] for tasks performed    Balance  Static Sitting Balance Static Sitting - Level of Assistance: 5: Stand by assistance  End of Session PT - End of Session Activity Tolerance: Patient limited by fatigue;Patient limited by pain Patient left: in bed;with call bell/phone within reach;with bed alarm set Nurse Communication: Mobility status   GP     Rada Hay 05/08/2012, 4:00 PM

## 2012-05-08 NOTE — Progress Notes (Signed)
Rehab Admissions Coordinator Note:  Patient was screened by Clois Dupes for appropriateness for an Inpatient Acute Rehab Consult. Noted PT recommendations. Noted medical comorbidities. At this time, we are recommending Inpatient Rehab consult.  I have contacted Dr. Gwenlyn Perking.  Clois Dupes, RN 05/08/2012, 2:52 PM  I can be reached at [phone].

## 2012-05-09 ENCOUNTER — Encounter (HOSPITAL_COMMUNITY): Payer: Self-pay | Admitting: Orthopedic Surgery

## 2012-05-09 ENCOUNTER — Inpatient Hospital Stay (HOSPITAL_COMMUNITY)
Admission: RE | Admit: 2012-05-09 | Discharge: 2012-05-15 | DRG: 945 | Disposition: A | Discharge status: Home Health | Payer: Medicare Other | Source: Ambulatory Visit | Attending: Physical Medicine & Rehabilitation | Admitting: Physical Medicine & Rehabilitation

## 2012-05-09 DIAGNOSIS — N189 Chronic kidney disease, unspecified: Secondary | ICD-10-CM

## 2012-05-09 DIAGNOSIS — Z87891 Personal history of nicotine dependence: Secondary | ICD-10-CM

## 2012-05-09 DIAGNOSIS — Y92009 Unspecified place in unspecified non-institutional (private) residence as the place of occurrence of the external cause: Secondary | ICD-10-CM

## 2012-05-09 DIAGNOSIS — S8263XA Displaced fracture of lateral malleolus of unspecified fibula, initial encounter for closed fracture: Secondary | ICD-10-CM

## 2012-05-09 DIAGNOSIS — K5900 Constipation, unspecified: Secondary | ICD-10-CM

## 2012-05-09 DIAGNOSIS — C3490 Malignant neoplasm of unspecified part of unspecified bronchus or lung: Secondary | ICD-10-CM

## 2012-05-09 DIAGNOSIS — W19XXXA Unspecified fall, initial encounter: Secondary | ICD-10-CM

## 2012-05-09 DIAGNOSIS — I129 Hypertensive chronic kidney disease with stage 1 through stage 4 chronic kidney disease, or unspecified chronic kidney disease: Secondary | ICD-10-CM

## 2012-05-09 DIAGNOSIS — S82843A Displaced bimalleolar fracture of unspecified lower leg, initial encounter for closed fracture: Secondary | ICD-10-CM

## 2012-05-09 DIAGNOSIS — Z79899 Other long term (current) drug therapy: Secondary | ICD-10-CM

## 2012-05-09 DIAGNOSIS — N179 Acute kidney failure, unspecified: Secondary | ICD-10-CM | POA: Diagnosis present

## 2012-05-09 DIAGNOSIS — S82899A Other fracture of unspecified lower leg, initial encounter for closed fracture: Secondary | ICD-10-CM

## 2012-05-09 DIAGNOSIS — E669 Obesity, unspecified: Secondary | ICD-10-CM

## 2012-05-09 DIAGNOSIS — Z9889 Other specified postprocedural states: Secondary | ICD-10-CM

## 2012-05-09 DIAGNOSIS — Z5189 Encounter for other specified aftercare: Principal | ICD-10-CM

## 2012-05-09 LAB — COMPREHENSIVE METABOLIC PANEL
AST: 12 U/L (ref 0–37)
CO2: 26 mEq/L (ref 19–32)
Calcium: 10.1 mg/dL (ref 8.4–10.5)
Creatinine, Ser: 1.28 mg/dL (ref 0.50–1.35)
GFR calc non Af Amer: 55 mL/min — ABNORMAL LOW (ref >90)
Total Protein: 6.3 g/dL (ref 6.0–8.3)

## 2012-05-09 MED ORDER — PANTOPRAZOLE SODIUM 40 MG PO TBEC
40.0000 mg | DELAYED_RELEASE_TABLET | Freq: Every day | ORAL | Status: DC
  Administered 2012-05-10 – 2012-05-15 (×6): 40 mg via ORAL
  Filled 2012-05-09 (×8): qty 1

## 2012-05-09 MED ORDER — ENOXAPARIN SODIUM 40 MG/0.4ML ~~LOC~~ SOLN
40.0000 mg | SUBCUTANEOUS | Status: DC
  Administered 2012-05-10 – 2012-05-14 (×5): 40 mg via SUBCUTANEOUS
  Filled 2012-05-09 (×8): qty 0.4

## 2012-05-09 MED ORDER — VITAMIN B-12 1000 MCG PO TABS
1000.0000 ug | ORAL_TABLET | Freq: Every day | ORAL | Status: DC
  Administered 2012-05-10 – 2012-05-15 (×6): 1000 ug via ORAL
  Filled 2012-05-09 (×8): qty 1

## 2012-05-09 MED ORDER — ONDANSETRON HCL 4 MG PO TABS: 4.0000 mg | ORAL_TABLET | Freq: Four times a day (QID) | ORAL | Status: DC | PRN

## 2012-05-09 MED ORDER — POLYETHYLENE GLYCOL 3350 17 G PO PACK
17.0000 g | PACK | Freq: Every day | ORAL | Status: DC | PRN
  Filled 2012-05-09: qty 1

## 2012-05-09 MED ORDER — ACETAMINOPHEN 325 MG PO TABS
325.0000 mg | ORAL_TABLET | ORAL | Status: DC | PRN
  Administered 2012-05-13 – 2012-05-15 (×2): 650 mg via ORAL
  Filled 2012-05-09 (×2): qty 2

## 2012-05-09 MED ORDER — SORBITOL 70 % SOLN: 30.0000 mL | Freq: Every day | Status: DC | PRN

## 2012-05-09 MED ORDER — FOLIC ACID 1 MG PO TABS
1.0000 mg | ORAL_TABLET | Freq: Every day | ORAL | Status: DC
  Administered 2012-05-10 – 2012-05-15 (×6): 1 mg via ORAL
  Filled 2012-05-09 (×8): qty 1

## 2012-05-09 MED ORDER — ONDANSETRON HCL 4 MG/2ML IJ SOLN: 4.0000 mg | Freq: Four times a day (QID) | INTRAMUSCULAR | Status: DC | PRN

## 2012-05-09 MED ORDER — OXYCODONE HCL 5 MG PO TABS
5.0000 mg | ORAL_TABLET | Freq: Four times a day (QID) | ORAL | Status: DC | PRN
  Administered 2012-05-09 – 2012-05-15 (×19): 5 mg via ORAL
  Filled 2012-05-09 (×21): qty 1

## 2012-05-09 NOTE — Plan of Care (Signed)
Overall Plan of Care [facility]) Patient Details Name: [name] MRN: [id number] DOB: 1941-04-17  Diagnosis:    Primary Diagnosis:    Ankle fracture Co-morbidities: htn, lung ca, pain  Functional Problem List  Patient demonstrates impairments in the following areas: Balance, Bladder, Bowel, Endurance, Medication Management, Motor, Pain, Safety and Skin Integrity  Basic ADL's: grooming, bathing, dressing and toileting Advanced ADL's: simple meal preparation  Transfers:  bed mobility, bed to chair, toilet, tub/shower, car and furniture Locomotion:  ambulation, wheelchair mobility and stairs  Additional Impairments:  Leisure Awareness and Discharge Disposition  Anticipated Outcomes Item Anticipated Outcome  Eating/Swallowing  independent  Basic self-care  supervision  Tolieting  Mod I  Bowel/Bladder  Continent of bowel and bladder with toileting  Transfers  Mod I  Locomotion  Supervision 29' with RW, 150' with knee walker  Communication    Cognition    Pain  3 or less on scale of 1-10  Safety/Judgment  supervision  Other     Therapy Plan: PT Frequency: 2-3 X/day, 60-90 minutes OT Frequency: 1-2 X/day, 60-90 minutes     Team Interventions: Item RN PT OT SLP SW TR Other  Self Care/Advanced ADL Retraining   x      Neuromuscular Re-Education   x      Therapeutic Activities  x x      UE/LE Strength Training/ROM  x x      UE/LE Coordination Activities  x x      Visual/Perceptual Remediation/Compensation   x      DME/Adaptive Equipment Instruction  x x      Therapeutic Exercise  x x      Balance/Vestibular Training  x x      Patient/Family Education x x x      Cognitive Remediation/Compensation   x      Functional Mobility Training  x x      Ambulation/Gait Training  x       Museum/gallery curator  x       Wheelchair Propulsion/Positioning  x       Functional Tourist information centre manager Reintegration   x      Dysphagia/Aspiration Sales executive         Bladder Management x        Bowel Management x        Disease Management/Prevention x        Pain Management x x       Medication Management x        Skin Care/Wound Management x        Splinting/Orthotics   x      Discharge Planning x x x      Psychosocial Support x  x                         Team Discharge Planning: Destination:  Home Projected Follow-up:  PT, OT and Home Health Projected Equipment Needs:  Environmental consultant, Wheelchair and knee walker Patient/family involved in discharge planning:  Yes  MD ELOS: 10 days Medical Rehab Prognosis:  Excellent Assessment: Pt admitted for CIR therapies. The team will be addressing Lower extremity strength, range of motion, stamina, balance, functional mobility, safety, adaptive techniques and equipment, ADL's, pain mgt. Goals are mod I to supervision.

## 2012-05-09 NOTE — H&P (Signed)
Physical Medicine and Rehabilitation Admission H&P    Chief Complaint  Patient presents with  . Loss of Consciousness  : HPI: [name] is a 71 y.o. 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 Arbutus Ped. He was admitted 05/05/12 secondary to episode of syncope. By report patient had received his last chemotherapy treatment 05/01/2012 he had not been eating or drinking much. Syncopal episode was witnessed by a home health aide and also by the wife who called 911. He did not hit his head during the fall per report of wife. He did have complaints of right ankle pain. X-rays demonstrated mild displaced fibular fracture/bimalleolar ankle fracture. Orthopedic services consulted and underwent open reduction internal fixation of right lateral malleolus fracture 05/07/2012 per Dr. Ave Filter. Advised nonweightbearing right lower extremity. Placed on subcutaneous Lovenox for DVT prophylaxis. Postoperative pain management. Hospital course of echocardiogram completed during workup of syncope that showed ejection fraction 65% grade 1 diastolic dysfunction. Cardiac enzymes and troponin unremarkable. Syncopal event felt to be multi-factorial related to recent chemotherapy. Physical therapy evaluation completed 05/08/2012 with recommendations of physical medicine rehabilitation consult to consider inpatient rehabilitation services. Patient was felt to be good candidate for inpatient rehabilitation services and was admitted for comprehensive rehabilitation program  Review of Systems  Constitutional: Positive for malaise/fatigue.  Gastrointestinal: Positive for nausea.  Musculoskeletal: Positive for myalgias.  Neurological: Positive for dizziness and weakness.  All other systems reviewed and are negative   Past Medical History  Diagnosis Date  . Hypertension   . ALLERGIC RHINITIS   . Malignant  neoplasm of bronchus and lung, unspecified site 01/23/2012   Past Surgical History  Procedure Date  . Wrist surgery 1961    right wrist   Family History  Problem Relation Age of Onset  . Hypertension Sister   . Hypertension Brother   . Hypertension Mother   . Coronary artery disease Mother    Social History:  reports that he quit smoking 4 days ago. His smoking use included Cigarettes. He has a 12.5 pack-year smoking history. He has never used smokeless tobacco. He reports that he does not drink alcohol or use illicit drugs. Allergies: No Known Allergies Medications Prior to Admission  Medication Sig Dispense Refill  . olmesartan-hydrochlorothiazide (BENICAR HCT) 40-12.5 MG per tablet Take 0.5 tablets by mouth daily.       Marland Kitchen PRESCRIPTION MEDICATION Inject into the vein once a week. Emend, Aloxi, Platinol and Camptosar infusions every Tuesday.      . prochlorperazine (COMPAZINE) 10 MG tablet Take 10 mg by mouth every 6 (six) hours as needed. For nausea.      . vitamin B-12 (CYANOCOBALAMIN) 1000 MCG tablet Take 1,000 mcg by mouth daily.      . [DISCONTINUED] HYDROcodone-acetaminophen (VICODIN) 5-500 MG per tablet 1/2 to 1 tablet by mouth every 6 hours as needed for pain  30 tablet  0    Home: Home Living Lives With: Spouse Available Help at Discharge: Family Type of Home: House Home Access: Stairs to enter Secretary/administrator of Steps: 1 Home Layout: One level Bathroom Shower/Tub: Health visitor: Standard Home Adaptive Equipment: Grab bars in shower;Shower chair with back   Functional History: Prior Function Able to Take Stairs?: Yes Driving: Yes  Functional Status:  Mobility: Bed Mobility Bed Mobility: Supine to Sit Supine to Sit: 4: Min assist;HOB flat;With rails Transfers Transfers: Sit to Stand;Stand to Sit;Stand Pivot Transfers Sit to  Stand: 1: +2 Total assist;From bed;With upper extremity assist Sit to Stand: Patient Percentage: 70% Stand to  Sit: 1: +2 Total assist;To chair/3-in-1;With upper extremity assist Stand to Sit: Patient Percentage: 70% Stand Pivot Transfers: 1: +2 Total assist Stand Pivot Transfers: Patient Percentage: 70% Ambulation/Gait Ambulation/Gait Assistance: Not tested (comment)    ADL: ADL Grooming: Simulated;Set up Where Assessed - Grooming: Unsupported sitting Upper Body Bathing: Simulated;Set up Where Assessed - Upper Body Bathing: Unsupported sitting Lower Body Bathing: Simulated;Moderate assistance Where Assessed - Lower Body Bathing: Supported sit to stand Upper Body Dressing: Simulated;Set up Where Assessed - Upper Body Dressing: Unsupported sitting Lower Body Dressing: Simulated;Moderate assistance Where Assessed - Lower Body Dressing: Supported sit to stand Toilet Transfer: Chief of Staff Method: Surveyor, minerals: Other (comment) (to chair) Equipment Used: Rolling walker;Gait belt Transfers/Ambulation Related to ADLs: Min cues for safety when pivoting with RW, hand placement, maintaining NWB status to RLE. ADL Comments: Once in chair, pt utilized urinal with min A.   Cognition: Cognition Arousal/Alertness: Awake/alert Orientation Level: Oriented X4 Cognition Overall Cognitive Status: Appears within functional limits for tasks assessed/performed Difficult to assess due to: Hard of hearing/deaf Arousal/Alertness: Awake/alert Orientation Level: Appears intact for tasks assessed Behavior During Session: [facility] for tasks performed   Blood pressure 139/78, pulse 83, temperature 98.8 F (37.1 C), temperature source Oral, resp. rate 18, height 6\' 1"  (1.854 m), weight 110.1 kg (242 lb 11.6 oz), SpO2 97.00%. Physical Exam  Vitals reviewed.  Constitutional: He appears well-developed and well-nourished.  HENT:  Head: Normocephalic and atraumatic.  Right Ear: External ear normal.  Left Ear: External ear normal.  Eyes: Conjunctivae normal and  EOM are normal. Pupils are equal, round, and reactive to light. Right eye exhibits discharge. Left eye exhibits no discharge.  Pupils round and reactive to light  Neck: Normal range of motion. Neck supple. No JVD present. No tracheal deviation present. No thyromegaly present.  Cardiovascular: Normal rate, regular rhythm and normal heart sounds. Exam reveals no friction rub.  No murmur heard.  Pulmonary/Chest: Effort normal and breath sounds normal. No respiratory distress. He has no wheezes.  Abdominal: Soft. Bowel sounds are normal. He exhibits distension. He exhibits no mass. There is no tenderness. There is no rebound and no guarding.  Obese  Musculoskeletal:  Right LE is appropriately tender.  Lymphadenopathy:  He has no cervical adenopathy.  Neurological: He is alert. He has normal reflexes. No cranial nerve deficit or sensory deficit.  Patient named person and place. He was able to get his appropriate age followed basic commands. RLE NVI. LLE 3-4/5 prox to 5/5 distally. UE are nearly 5/5  Skin:  Psychiatric: He has a normal mood and affect. His behavior is normal. Judgment and thought content normal Right lower extremity is splinted     Results for orders placed during the hospital encounter of 05/05/12 (from the past 48 hour(s))  COMPREHENSIVE METABOLIC PANEL     Status: Abnormal   Collection Time   05/08/12  4:50 AM      Component Value Range Comment   Sodium 135  135 - 145 mEq/L    Potassium 4.3  3.5 - 5.1 mEq/L    Chloride 102  96 - 112 mEq/L    CO2 26  19 - 32 mEq/L    Glucose, Bld 153 (*) 70 - 99 mg/dL    BUN 25 (*) 6 - 23 mg/dL    Creatinine, Ser 1.61 (*) 0.50 - 1.35 mg/dL  Calcium 10.2  8.4 - 10.5 mg/dL    Total Protein 6.7  6.0 - 8.3 g/dL    Albumin 3.2 (*) 3.5 - 5.2 g/dL    AST 14  0 - 37 U/L    ALT 12  0 - 53 U/L    Alkaline Phosphatase 59  39 - 117 U/L    Total Bilirubin 0.5  0.3 - 1.2 mg/dL    GFR calc non Af Amer 49 (*) >90 mL/min    GFR calc Af Amer 57  (*) >90 mL/min   COMPREHENSIVE METABOLIC PANEL     Status: Abnormal   Collection Time   05/09/12  4:51 AM      Component Value Range Comment   Sodium 137  135 - 145 mEq/L    Potassium 4.1  3.5 - 5.1 mEq/L    Chloride 103  96 - 112 mEq/L    CO2 26  19 - 32 mEq/L    Glucose, Bld 121 (*) 70 - 99 mg/dL    BUN 22  6 - 23 mg/dL    Creatinine, Ser 1.61  0.50 - 1.35 mg/dL    Calcium 09.6  8.4 - 10.5 mg/dL    Total Protein 6.3  6.0 - 8.3 g/dL    Albumin 2.9 (*) 3.5 - 5.2 g/dL    AST 12  0 - 37 U/L    ALT 9  0 - 53 U/L    Alkaline Phosphatase 53  39 - 117 U/L    Total Bilirubin 0.5  0.3 - 1.2 mg/dL    GFR calc non Af Amer 55 (*) >90 mL/min    GFR calc Af Amer 63 (*) >90 mL/min    Dg Ankle 2 Views Right  05/07/2012  *RADIOLOGY REPORT*  Clinical Data: Right ankle plate and screw placement  DG C-ARM 1-60 MIN - [facility], RIGHT ANKLE - 2 VIEW  Comparison: Right ankle radiographs - 05/05/2012  Findings:  Two spot intraoperative fluoroscopic images of the right ankle are provided for review.  The patient has undergone side plate fixation of previously noted displaced distal fibular metadiaphysis fracture.  There is an additional crossing cancellous screws seen at the fracture site.  Alignment appears anatomic.  The ankle mortise is preserved.  There is a minimal amount of soft tissue swelling about the operative site.  No radiopaque foreign body.  IMPRESSION: Post ORIF of distal fibular fracture without complicating feature.   Original Report Authenticated By: Tacey Ruiz, MD    Dg C-arm 1-60 Min-no Report  05/07/2012  *RADIOLOGY REPORT*  Clinical Data: Right ankle plate and screw placement  DG C-ARM 1-60 MIN - [facility], RIGHT ANKLE - 2 VIEW  Comparison: Right ankle radiographs - 05/05/2012  Findings:  Two spot intraoperative fluoroscopic images of the right ankle are provided for review.  The patient has undergone side plate fixation of previously noted displaced distal fibular metadiaphysis fracture.   There is an additional crossing cancellous screws seen at the fracture site.  Alignment appears anatomic.  The ankle mortise is preserved.  There is a minimal amount of soft tissue swelling about the operative site.  No radiopaque foreign body.  IMPRESSION: Post ORIF of distal fibular fracture without complicating feature.   Original Report Authenticated By: Tacey Ruiz, MD     Post Admission Physician Evaluation: 1. Functional deficits secondary  to right lateral malleolus fx s/p ORIF. 2. Patient is admitted to receive collaborative, interdisciplinary care between the physiatrist, rehab nursing  staff, and therapy team. 3. Patient's level of medical complexity and substantial therapy needs in context of that medical necessity cannot be provided at a lesser intensity of care such as a SNF. 4. Patient has experienced substantial functional loss from his/her baseline which was documented above under the "Functional History" and "Functional Status" headings.  Judging by the patient's diagnosis, physical exam, and functional history, the patient has potential for functional progress which will result in measurable gains while on inpatient rehab.  These gains will be of substantial and practical use upon discharge  in facilitating mobility and self-care at the household level. 5. Physiatrist will provide 24 hour management of medical needs as well as oversight of the therapy plan/treatment and provide guidance as appropriate regarding the interaction of the two. 6. 24 hour rehab nursing will assist with bladder management, bowel management, safety, skin/wound care, disease management, medication administration, pain management and patient education  and help integrate therapy concepts, techniques,education, etc. 7. PT will assess and treat for:  Lower extremity strength, range of motion, stamina, balance, functional mobility, safety, adaptive techniques and equipment, pain mgt.  Goals are: mod I. 8. OT will  assess and treat for: ADL's, functional mobility, safety, upper extremity strength, adaptive techniques and equipment.   Goals are: mod I to set up. 9. SLP will assess and treat for: n/a.  Goals are: n/a. 10. Case Management and Social Worker will assess and treat for psychological issues and discharge planning. 11. Team conference will be held weekly to assess progress toward goals and to determine barriers to discharge. 12. Patient will receive at least 3 hours of therapy per day at least 5 days per week. 13. ELOS: 7-10 days      Prognosis:  excellent   Medical Problem List and Plan: 1. Right lateral malleolus fracture after syncopal episode status post ORIF 05/07/2012 2. DVT Prophylaxis/Anticoagulation: Subcutaneous Lovenox. Monitor platelet counts any signs of bleeding 3. Pain Management: Oxycodone as needed. Monitor with increased mobility 4. Neuropsych: This patient is capable of making decisions on his/her own behalf. 5. Hypertension. Presently on no antihypertensive medications. Patient was on Benicar 0.5 mg daily prior to admission. Will monitor the increased mobility 6. History small cell lung cancer. Patient had been receiving chemotherapy and will followup with Dr. Arbutus Ped 7. Chronic kidney disease. Baseline creatinine 1.4. Followup chemistries 8. Obesity. Weight currently 242 pounds 05/09/2012 Ivory Broad, MD

## 2012-05-09 NOTE — PMR Pre-admission (Signed)
PMR Admission Coordinator Pre-Admission Assessment  Patient: [name] is an 71 y.o., male MRN: [id number] DOB: August 25, 1940 Height: 6\' 1"  (185.4 cm) Weight: 110.1 kg (242 lb 11.6 oz)              Insurance Information HMO: No    PPO:       PCP:       IPA:       80/20:       OTHER:   PRIMARY: Medicare A/B      Policy#: [id number]      Subscriber: [name] CM Name:        Phone#:       Fax#:   Pre-Cert#:        Employer: Retired Benefits:  Phone #:       Name: Armed forces training and education officer. Date: 05/13/05     Deduct: $1184      Out of Pocket Max: none      Life Max: unlimited CIR: 100%      SNF: 100 days   LBD = none Outpatient: 80%     Co-Pay: 20% Home Health: 100%      Co-Pay: none DME: 80%     Co-Pay: 20% Providers: patient's choice  SECONDARY: [employer]      Policy#: [id number]      Subscriber: [name] CM Name:        Phone#:       Fax#:   Pre-Cert#:        Employer:  Retired Benefits:  Phone #: [phone]     Name:   Dolores Hoose. Date:       Deduct:        Out of Pocket Max:        Life Max:   CIR:        SNF:   Outpatient:       Co-Pay:   Home Health:        Co-Pay:   DME:       Co-Pay:    Emergency Contact Information Contact Information    Name Relation Home Work Mobile   Casebier,Peggy Spouse [phone]       Current Medical History  Patient Admitting Diagnosis: Right lateral malleolus fx after syncopal episode with ORIF   History of Present Illness: A 71 y.o. 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 Arbutus Ped. He was admitted 05/05/12 secondary to episode of syncope. By report patient had received his last chemotherapy treatment 05/01/2012 he had not been eating or drinking much. Syncopal episode was witnessed by a home health aide and also by the wife who called 911. He did not hit his head during the fall per report of wife. He did have complaints  of right ankle pain. X-rays demonstrated mild displaced fibular fracture/bimalleolar ankle fracture. Orthopedic services consulted and underwent open reduction internal fixation of right lateral malleolus fracture 05/07/2012 per Dr. Ave Filter. Advised nonweightbearing right lower extremity. Placed on subcutaneous Lovenox for DVT prophylaxis. Postoperative pain management. Hospital course of echocardiogram completed during workup of syncope that showed ejection fraction 65% grade 1 diastolic dysfunction. Cardiac enzymes and troponin unremarkable. Syncopal event felt to be multi-factorial related to recent chemotherapy. Physical therapy evaluation completed 05/08/2012 with recommendations of physical medicine rehabilitation consult to consider inpatient rehabilitation services.   Past Medical History  Past Medical History  Diagnosis Date  . Hypertension   . ALLERGIC RHINITIS   . Malignant  neoplasm of bronchus and lung, unspecified site 01/23/2012    Family History  family history includes Coronary artery disease in his mother and Hypertension in his brother, mother, and sister.  Prior Rehab/Hospitalizations: None   Current Medications  Current facility-administered medications:0.9 %  sodium chloride infusion, , Intravenous, Continuous, Vassie Loll, MD, Last Rate: 20 mL/hr at 05/07/12 1219, 20 mL/hr at 05/07/12 1219;  acetaminophen (TYLENOL) suppository 650 mg, 650 mg, Rectal, Q6H PRN, Vassie Loll, MD;  acetaminophen (TYLENOL) tablet 650 mg, 650 mg, Oral, Q6H PRN, Vassie Loll, MD, 650 mg at 05/09/12 0458 enoxaparin (LOVENOX) injection 40 mg, 40 mg, Subcutaneous, Q24H, Jiles Harold, PA, 40 mg at 05/09/12 0744;  folic acid (FOLVITE) tablet 1 mg, 1 mg, Oral, Daily, Vassie Loll, MD, 1 mg at 05/09/12 1610;  hydrALAZINE (APRESOLINE) injection 10 mg, 10 mg, Intravenous, Q6H PRN, Vassie Loll, MD;  metoCLOPramide (REGLAN) injection 5-10 mg, 5-10 mg, Intravenous, Q8H PRN, Jiles Harold,  PA metoCLOPramide (REGLAN) tablet 5-10 mg, 5-10 mg, Oral, Q8H PRN, Jiles Harold, PA;  ondansetron (ZOFRAN) injection 4 mg, 4 mg, Intravenous, Q6H PRN, Jiles Harold, PA;  ondansetron (ZOFRAN) tablet 4 mg, 4 mg, Oral, Q6H PRN, Jiles Harold, PA;  oxyCODONE (Oxy IR/ROXICODONE) immediate release tablet 5 mg, 5 mg, Oral, Q6H PRN, Vassie Loll, MD, 5 mg at 05/09/12 1208 pantoprazole (PROTONIX) EC tablet 40 mg, 40 mg, Oral, Q1200, Vassie Loll, MD, 40 mg at 05/09/12 9604;  sodium chloride 0.9 % injection 3 mL, 3 mL, Intravenous, Q12H, Vassie Loll, MD, 3 mL at 05/09/12 5409;  vitamin B-12 (CYANOCOBALAMIN) tablet 1,000 mcg, 1,000 mcg, Oral, Daily, Vassie Loll, MD, 1,000 mcg at 05/09/12 8119 [DISCONTINUED] dextrose 5 % and 0.45 % NaCl with KCl 20 mEq/L infusion, , Intravenous, Continuous, Jiles Harold, PA, Last Rate: 100 mL/hr at 05/08/12 1505, 100 mL/hr at 05/08/12 1505  Patients Current Diet: Cardiac  Precautions / Restrictions Precautions Precautions: Fall Restrictions Weight Bearing Restrictions: Yes RLE Weight Bearing: Non weight bearing   Prior Activity Level Community (5-7x/wk): Went out daily.  Home Assistive Devices / Equipment Home Assistive Devices/Equipment: None Home Adaptive Equipment: Grab bars in shower;Shower chair with back  Prior Functional Level Prior Function Level of Independence: Independent Able to Take Stairs?: Yes Driving: Yes  Current Functional Level Cognition  Arousal/Alertness: Awake/alert Overall Cognitive Status: Appears within functional limits for tasks assessed/performed Difficult to assess due to: Hard of hearing/deaf Orientation Level: Oriented X4    Extremity Assessment (includes Sensation/Coordination)  RUE ROM/Strength/Tone: WFL for tasks assessed  RLE ROM/Strength/Tone: Deficits RLE ROM/Strength/Tone Deficits: pt. is able to lift leg from bed. RLE Sensation: WFL - Light Touch    ADLs  Grooming: Simulated;Set  up Where Assessed - Grooming: Unsupported sitting Upper Body Bathing: Simulated;Set up Where Assessed - Upper Body Bathing: Unsupported sitting Lower Body Bathing: Simulated;Moderate assistance Where Assessed - Lower Body Bathing: Supported sit to stand Upper Body Dressing: Simulated;Set up Where Assessed - Upper Body Dressing: Unsupported sitting Lower Body Dressing: Simulated;Moderate assistance Where Assessed - Lower Body Dressing: Supported sit to stand Toilet Transfer: Chief of Staff: Patient Percentage: 70% Statistician Method: Surveyor, minerals: Other (comment) (to chair) Toileting - Clothing Manipulation and Hygiene: Performed;Minimal assistance Equipment Used: Rolling walker;Gait belt Transfers/Ambulation Related to ADLs: Min cues for safety when pivoting with RW, hand placement, maintaining NWB status to RLE. ADL Comments: Once in chair, pt utilized urinal with min A.     Mobility  Bed Mobility: Supine to Sit Supine to Sit:  4: Min assist;HOB flat;With rails    Transfers  Transfers: Sit to Stand;Stand to Sit;Stand Pivot Transfers Sit to Stand: 1: +2 Total assist;From bed;With upper extremity assist Sit to Stand: Patient Percentage: 70% Stand to Sit: 1: +2 Total assist;To chair/3-in-1;With upper extremity assist Stand to Sit: Patient Percentage: 70% Stand Pivot Transfers: 1: +2 Total assist Stand Pivot Transfers: Patient Percentage: 70%    Ambulation / Gait / Stairs / Wheelchair Mobility  Ambulation/Gait Ambulation/Gait Assistance: Not tested (comment)    Posture / Balance Static Sitting Balance Static Sitting - Level of Assistance: 5: Stand by assistance    Special needs/care consideration BiPAP/CPAP No CPM No Continuous Drip IV No Dialysis No        Days No Life Vest No Oxygen No Special Bed No Trach Size No Wound Vac (area) No      Location  Skin Ankle fracture                              Location Right  ankle Bowel mgmt: Last BM on Saturday 11/23 Bladder mgmt:Voiding WNL Diabetic mgmt None     Previous Home Environment Living Arrangements: Spouse/significant other Lives With: Spouse Available Help at Discharge: Family Type of Home: House Home Layout: One level Home Access: Stairs to enter Secretary/administrator of Steps: 1 Bathroom Shower/Tub: Health visitor: Standard Home Care Services: Yes Type of Home Care Services: Homehealth aide Home Care Agency (if known): [facility]  Discharge Living Setting Plans for Discharge Living Setting: Patient's home;Lives with (comment);Other (Comment) (Lives in a condo with wife.) Type of Home at Discharge: Apartment (Condo, not really apartment.) Discharge Home Layout: One level Discharge Home Access: Level entry Do you have any problems obtaining your medications?: No  Social/Family/Support Systems Patient Roles: Spouse;Parent Contact Information: Orenthal Debski - spouse (h) [phone] Anticipated Caregiver: self Ability/Limitations of Caregiver: Wife has a caretaker, has back problems. Caregiver Availability: Other (Comment) (Has mod I goals, so no caregiver identified.) Discharge Plan Discussed with Primary Caregiver: Yes Is Caregiver In Agreement with Plan?: Yes Does Caregiver/Family have Issues with Lodging/Transportation while Pt is in Rehab?: No    Goals/Additional Needs Patient/Family Goal for Rehab: PT/OT mod I, no ST needs Expected length of stay: 7-10 days Cultural Considerations: None Dietary Needs: Regular diet Equipment Needs: TBD Pt/Family Agrees to Admission and willing to participate: Yes Program Orientation Provided & Reviewed with Pt/Caregiver Including Roles  & Responsibilities: Yes   Decrease burden of Care through IP rehab admission:  Not applicable  Possible need for SNF placement upon discharge: Not likely   Patient Condition: This patient's condition remains as documented in the Consult dated  05/09/12, in which the Rehabilitation Physician determined and documented that the patient's condition is appropriate for intensive rehabilitative care in an inpatient rehabilitation facility.  Preadmission Screen Completed By:  Trish Mage, 05/09/2012 12:32 PM ______________________________________________________________________   Discussed status with Dr. Riley Kill on 11/27 at 1241 and received telephone approval for admission today.  Admission Coordinator:  Trish Mage, time1241/Date11/27/13

## 2012-05-09 NOTE — Progress Notes (Signed)
Report called to inpatient rehab [facility]. Patient in stable condition and discharging via care link. Erskin Burnet RN

## 2012-05-09 NOTE — Discharge Summary (Signed)
Physician Discharge Summary  [name] AVW:[id number] DOB: [birth year] DOA: 05/05/2012  PCP: [name], MD  Admit date: 05/05/2012 Discharge date: 05/09/2012  Time spent: 35 minutes  Recommendations for Outpatient Follow-up:  1. Follow up with PC as an outpatient  Discharge Diagnoses:  Principal Problem:  *Syncope and collapse Active Problems:  Small cell lung carcinoma  HTN (hypertension)  CKD (chronic kidney disease) stage 3, GFR 30-59 ml/min  Thrombocytopenia  Heart murmur  Right ankle pain  Ankle fracture, right   Discharge Condition: stable  Diet recommendation: Regular diet  Filed Weights   05/07/12 0640 05/08/12 0438 05/09/12 0538  Weight: 99.7 kg (219 lb 12.8 oz) 110.1 kg (242 lb 11.6 oz) 110.1 kg (242 lb 11.6 oz)    History of present illness:  71 y.o. 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 Patient reports that since his last chemotherapy treatment on 05/01/12 he has been no eating or drinking much, and also reports that his blood pressure has been softer than usual. He denies any chest pain, shortness of breath, cough, fever/chills, nausea/vomiting, headache, vision changes or palpitations. Patient reports that he was feeling moody whoozy/lightheaded prior to experiencing the event. Syncope was witnessed by home health aide and also by his wife which were able to call 911 right away and assist the patient. He never hit his head during the fall and the event was transient (no post ictal). Patient endorses right ankle pain after the syncope episode and reports that he has twisted his ankle during the syncope event. [facility] has been called to admit for further evaluation and tx. Patient ankle x-ray demonstrated mild displace fibula fracture (ortho has been called to provide further recommendations)   Hospital  Course:  Syncope and collapse:  -Patient syncope episode most likely 2/2 to orthostasis; no arrhythmias on telemetry. ON diuretics t home. - 2-D echo :demonstrating just grade 1 diastolic heart failure.  -cardiac enzymes, TSH, phosphorus, Mg, B12 level and a urinalysis WNL.  -Treated with aggressive IV fluids, antihypertensive held. Can be resume as BP increase over the next several days -Cortisol level WNL.  -CIR on 11.27.2013  Small cell lung carcinoma:  -Patient receiving active chemotherapy, to continue as an outpatient.  HTN (hypertension):  -Bp meds held on admission. - resume as an outpatient.  Acute on CKD (chronic kidney disease) stage 3, GFR 30-59 ml/min: Creatinine 1.4;  - BP meds held, treated with IV fluids Creatinine return to baseline. - Can resume ACE in 1 week.  GERD:  -Continue PPI.   Thrombocytopenia:  -No signs of overt bleeding. Most likely secondary to ongoing chemotherapy. Avoid long term use of heparin products.   Right ankle pain:  -Ankle x-ray demonstrated Mildly displaced fracture of the distal fibula with associated mild talar subluxation laterally.  -S/p ORIF of his right ankle. Will follow ortho recommendations.  - percocet PRN for pain.  Weakness/physical deconditioning:  -multifactorial (malignancy, fracture, dehydration); and worsen with chemotherapy. Per PT recommendations he needs 24/7 care and assistance especially now after surgery when he can not bear weight on his RLE.  - patient qualified for CIR.   Consultants:  Ortho (Dr. Ave Filter)  Oncology (Dr. Arbutus Ped) Procedures:  2-D echo (demonstrating preserved EF and just grade 1 diastolic dysfunction)  Right ankle x-ray (see complete report below; but positive for acute mildly displaced fx) Antibiotics:  none  Discharge Exam: Filed Vitals:  05/08/12 0438 05/08/12 1320 05/08/12 2205 05/09/12 0538  BP: 146/78 156/88 129/73 139/78  Pulse: 92 92 92 83  Temp: 98.2 F (36.8 C) 98.2 F  (36.8 C) 99 F (37.2 C) 98.8 F (37.1 C)  TempSrc: Oral Oral Oral Oral  Resp:  16 18 18   Height:      Weight: 110.1 kg (242 lb 11.6 oz)   110.1 kg (242 lb 11.6 oz)  SpO2: 100% 100% 96% 97%    General: A&Ox3 Cardiovascular: RRR Respiratory: good air movement CTA B/L  Discharge Instructions  Discharge Orders    Future Appointments: Provider: Department: Dept Phone: Center:   05/15/2012 9:15 AM Radene Gunning [facility] [phone] None   05/15/2012 9:45 AM Conni Slipper, PA [place] [facility] [phone] None   05/15/2012 10:45 AM [facility] [place] [facility] [phone] None   05/22/2012 9:30 AM Delcie Roch [place] [facility] [phone] None   05/22/2012 10:00 AM [facility] [place] [facility] [phone] None     Future Orders Please Complete By Expires   Non weight bearing          Medication List     As of 05/09/2012  9:34 AM    STOP taking these medications         HYDROcodone-acetaminophen 5-500 MG per tablet   Commonly known as: VICODIN      TAKE these medications         aspirin EC 325 MG tablet   Take 1 tablet (325 mg total) by mouth 2 (two) times daily.      BENICAR HCT 40-12.5 MG per tablet   Generic drug: olmesartan-hydrochlorothiazide   Take 0.5 tablets by mouth daily.      oxyCODONE-acetaminophen 5-325 MG per tablet   Commonly known as: PERCOCET/ROXICET   Take 1-2 tablets by mouth every 4 (four) hours as needed for pain.      PRESCRIPTION MEDICATION   Inject into the vein once a week. Emend, Aloxi, Platinol and Camptosar infusions every Tuesday.      prochlorperazine 10 MG tablet   Commonly known as: COMPAZINE   Take 10 mg by mouth every 6 (six) hours as needed. For nausea.      vitamin B-12 1000 MCG tablet   Commonly known as: CYANOCOBALAMIN   Take 1,000 mcg by mouth daily.             Follow-up Information    Follow up with Mable Paris, MD. Schedule an appointment as soon as possible for a visit in 10 days.   Contact information:   [address] [place] [address] [phone]           The results of significant diagnostics from this hospitalization (including imaging, microbiology, ancillary and laboratory) are listed below for reference.    Significant Diagnostic Studies: Dg Chest 2 View  05/05/2012  *RADIOLOGY REPORT*  Clinical Data: Lung cancer, syncope, weakness  CHEST - 2 VIEW  Comparison: 03/09/2012, 01/27/2012, 01/18/2012  Findings: Nodular opacities in the right lower lobe noted compatible with known lung cancer.  Compared to 01/18/2012, there is slight improvement in the right lower lobe aeration compatible with resolving pneumonia or postobstructive process.  No large effusion or pneumothorax.  Left lung remains relatively clear. Stable heart size and vascularity.  Trachea is midline.  Slight pleural thickening on the right, stable.  IMPRESSION: Right lower lobe  nodular opacities compatible with known lung cancer.  Improving right lower lobe aeration compatible with resolving pneumonia/post obstructive pneumonitis.   Original Report Authenticated By: Judie Petit. Shick, M.D.    Dg Ankle 2 Views Right  05/07/2012  *RADIOLOGY REPORT*  Clinical Data: Right ankle plate and screw placement  DG C-ARM 1-60 MIN - [facility]PT [facility], RIGHT ANKLE - 2 VIEW  Comparison: Right ankle radiographs - 05/05/2012  Findings:  Two spot intraoperative fluoroscopic images of the right ankle are provided for review.  The patient has undergone side plate fixation of previously noted displaced distal fibular metadiaphysis fracture.  There is an additional crossing cancellous screws seen at the fracture site.  Alignment appears anatomic.  The ankle mortise is preserved.  There is a minimal amount of soft tissue swelling about the operative site.  No radiopaque foreign body.   IMPRESSION: Post ORIF of distal fibular fracture without complicating feature.   Original Report Authenticated By: Tacey Ruiz, MD    Dg Ankle Complete Right  05/05/2012  *RADIOLOGY REPORT*  Clinical Data: Fall with right ankle injury.  RIGHT ANKLE - COMPLETE 3+ VIEW  Comparison: None.  Findings: Mildly displaced oblique fracture is present through the distal fibula that extends into the ankle mortise.  There is associated mild subluxation of the talar dome laterally with widening of the medial mortise.  In the lateral projection, there is potentially a subtle nondisplaced posterior malleolar fracture. However, this may be overlap of the lateral malleolar fracture rather than a separate fracture.  IMPRESSION: Mildly displaced fracture of the distal fibula with associated mild talar subluxation laterally.  There may also be a subtle component of a posterior malleolar fracture.   Original Report Authenticated By: Irish Lack, M.D.    Dg C-arm 1-60 Min-no Report  05/07/2012  *RADIOLOGY REPORT*  Clinical Data: Right ankle plate and screw placement  DG C-ARM 1-60 MIN - [facility], RIGHT ANKLE - 2 VIEW  Comparison: Right ankle radiographs - 05/05/2012  Findings:  Two spot intraoperative fluoroscopic images of the right ankle are provided for review.  The patient has undergone side plate fixation of previously noted displaced distal fibular metadiaphysis fracture.  There is an additional crossing cancellous screws seen at the fracture site.  Alignment appears anatomic.  The ankle mortise is preserved.  There is a minimal amount of soft tissue swelling about the operative site.  No radiopaque foreign body.  IMPRESSION: Post ORIF of distal fibular fracture without complicating feature.   Original Report Authenticated By: Tacey Ruiz, MD     Microbiology: No results found for this or any previous visit (from the past 240 hour(s)).   Labs: Basic Metabolic Panel:  Lab 05/09/12 5284 05/08/12 0450 05/07/12  0427 05/06/12 0452 05/05/12 1651 05/05/12 1330  NA 137 135 135 136 -- 136  K 4.1 4.3 4.2 4.5 -- 4.6  CL 103 102 102 101 -- 102  CO2 26 26 26 26  -- 27  GLUCOSE 121* 153* 144* 142* -- 109*  BUN 22 25* 30* 34* -- 38*  CREATININE 1.28 1.40* 1.40* 1.55* -- 1.63*  CALCIUM 10.1 10.2 10.5 10.4 -- 10.5  MG -- -- -- -- 1.8 --  PHOS -- -- -- -- 3.5 --   Liver Function Tests:  Lab 05/09/12 0451 05/08/12 0450 05/05/12 1651 05/05/12 1330  AST 12 14 18 18   ALT 9 12 17 16   ALKPHOS 53 59 67 66  BILITOT 0.5 0.5 0.5 0.4  PROT 6.3 6.7 7.0 6.9  ALBUMIN 2.9* 3.2* 3.7 3.5   No results found for this basename: LIPASE:5,AMYLASE:5 in the last 168 hours No results found for this basename: AMMONIA:5 in the last 168 hours CBC:  Lab 05/07/12 0427 05/06/12 0452 05/05/12 1330  WBC 6.2 6.7 4.8  NEUTROABS -- -- --  HGB 12.2* 12.6* 13.4  HCT 34.1* 35.9* 37.9*  MCV 99.1 100.6* 99.5  PLT 121* 121* 139*   Cardiac Enzymes:  Lab 05/06/12 0101 05/05/12 2021 05/05/12 1330  CKTOTAL -- -- --  CKMB -- -- --  CKMBINDEX -- -- --  TROPONINI <0.30 <0.30 <0.30   BNP: BNP (last 3 results) No results found for this basename: PROBNP:3 in the last 8760 hours CBG: No results found for this basename: GLUCAP:5 in the last 168 hours     Signed:  Marinda Elk  [facility] 05/09/2012, 9:34 AM

## 2012-05-09 NOTE — Interval H&P Note (Signed)
[name] was admitted today to Inpatient Rehabilitation with the diagnosis of right lateral malleolus fx.  The patient's history has been reviewed, patient examined, and there is no change in status.  Patient continues to be appropriate for intensive inpatient rehabilitation.  I have reviewed the patient's chart and labs.  Questions were answered to the patient's satisfaction.  SWARTZ,ZACHARY T 05/09/2012, 7:57 PM

## 2012-05-09 NOTE — Evaluation (Signed)
Occupational Therapy Evaluation Patient Details Name: [name] MRN: [id number] DOB: 1941-03-01 Today's Date: 05/09/2012 Time:  -     OT Assessment / Plan / Recommendation Clinical Impression  Pt s/p R ankle ORIF after a syncopal episode. Skilled OT indicated to maximize independece with BADLs to setup/min A level in prep for d/c to next venue of care.    OT Assessment  Patient needs continued OT Services    Follow Up Recommendations  CIR    Barriers to Discharge Decreased caregiver support    Equipment Recommendations  Rolling walker with 5" wheels;3 in 1 bedside comode    Recommendations for Other Services    Frequency  Min 2X/week    Precautions / Restrictions Precautions Precautions: Fall Restrictions Weight Bearing Restrictions: Yes RLE Weight Bearing: Non weight bearing   Pertinent Vitals/Pain Reported "soreness" in RLE which he did not rate. Repositioned for comfort.    ADL  Grooming: Simulated;Set up Where Assessed - Grooming: Unsupported sitting Upper Body Bathing: Simulated;Set up Where Assessed - Upper Body Bathing: Unsupported sitting Lower Body Bathing: Simulated;Moderate assistance Where Assessed - Lower Body Bathing: Supported sit to stand Upper Body Dressing: Simulated;Set up Where Assessed - Upper Body Dressing: Unsupported sitting Lower Body Dressing: Simulated;Moderate assistance Where Assessed - Lower Body Dressing: Supported sit to stand Toilet Transfer: Chief of Staff: Patient Percentage: 70% Statistician Method: Surveyor, minerals: Other (comment) (to chair) Toileting - Clothing Manipulation and Hygiene: Performed;Minimal assistance Equipment Used: Rolling walker;Gait belt Transfers/Ambulation Related to ADLs: Min cues for safety when pivoting with RW, hand placement, maintaining NWB status to RLE. ADL Comments: Once in chair, pt utilized urinal with min A.     OT Diagnosis:  Generalized weakness  OT Problem List: Decreased activity tolerance;Decreased safety awareness;Decreased knowledge of use of DME or AE;Pain OT Treatment Interventions: Self-care/ADL training;Therapeutic activities;DME and/or AE instruction;Patient/family education   OT Goals Acute Rehab OT Goals OT Goal Formulation: With patient Time For Goal Achievement: 05/23/12 Potential to Achieve Goals: Good ADL Goals Pt Will Perform Grooming: with supervision;Standing at sink ADL Goal: Grooming - Progress: Goal set today Pt Will Perform Lower Body Bathing: with min assist;Sit to stand from chair;Sit to stand from bed ADL Goal: Lower Body Bathing - Progress: Goal set today Pt Will Perform Lower Body Dressing: with min assist;Sit to stand from bed;Sit to stand from chair ADL Goal: Lower Body Dressing - Progress: Goal set today Pt Will Transfer to Toilet: with supervision;with DME;3-in-1 ADL Goal: Toilet Transfer - Progress: Goal set today Pt Will Perform Toileting - Clothing Manipulation: with supervision;Sitting on 3-in-1 or toilet;Standing ADL Goal: Toileting - Clothing Manipulation - Progress: Goal set today Pt Will Perform Toileting - Hygiene: with supervision;Sit to stand from 3-in-1/toilet ADL Goal: Toileting - Hygiene - Progress: Goal set today  Visit Information  Last OT Received On: 05/09/12 Assistance Needed: +2 PT/OT Co-Evaluation/Treatment: Yes    Subjective Data  Subjective: Every time I move I have to pee. Patient Stated Goal: I don't know where I'm going from here.   Prior Functioning     Home Living Lives With: Spouse Available Help at Discharge: Family Type of Home: House Home Access: Stairs to enter Secretary/administrator of Steps: 1 Home Layout: One level Bathroom Shower/Tub: Health visitor: Standard Home Adaptive Equipment: Grab bars in shower;Shower chair with back Prior Function Level of Independence: Independent Able to Take Stairs?:  Yes Driving: Yes Communication Communication: No difficulties Dominant Hand: Right  Vision/Perception     Cognition  Overall Cognitive Status: Appears within functional limits for tasks assessed/performed Difficult to assess due to: Hard of hearing/deaf Arousal/Alertness: Awake/alert Orientation Level: Appears intact for tasks assessed Behavior During Session: [facility] for tasks performed    Extremity/Trunk Assessment Right Upper Extremity Assessment RUE ROM/Strength/Tone: [facility] for tasks assessed Left Upper Extremity Assessment LUE ROM/Strength/Tone: [facility] for tasks assessed     Mobility Bed Mobility Supine to Sit: 4: Min assist;HOB flat;With rails Details for Bed Mobility Assistance: Min A needed for RLE. Transfers Sit to Stand: 1: +2 Total assist;From bed;With upper extremity assist Sit to Stand: Patient Percentage: 70% Stand to Sit: 1: +2 Total assist;To chair/3-in-1;With upper extremity assist Stand to Sit: Patient Percentage: 70% Details for Transfer Assistance: Physical A needed to rise, stabilize and control descent to chair. Min cues for hand placement.     Shoulder Instructions     Exercise     Balance     End of Session OT - End of Session Equipment Utilized During Treatment: Gait belt Activity Tolerance: Patient tolerated treatment well Patient left: in chair;with call bell/phone within reach  GO     Laure Leone A OTR/L [phone] 05/09/2012, 8:50 AM

## 2012-05-09 NOTE — Progress Notes (Signed)
Rehab admissions - Evaluated for possible admission.  I spoke with patient and he is agreeable to inpatient rehab admission.  Bed available and will plan admit to inpatient rehab today.  Call me for questions.  #[phone]

## 2012-05-09 NOTE — Progress Notes (Addendum)
Patient admitted to 32. Patient oriented to medicare data sheet, rehab routine, white board, team conference, therapy schedules,meal times, lead nursing, preferred name, rehab goal and pain goal, safety plan, video and agreement,please see CHL for details of assessment. [name], [name]

## 2012-05-09 NOTE — Progress Notes (Signed)
PATIENT ID: [name]   2 Days Post-Op Procedure(s) (LRB): OPEN REDUCTION INTERNAL FIXATION (ORIF) ANKLE FRACTURE (Right)  Subjective: Doing well, no sig pain.  Going to rehab?  Objective:  Filed Vitals:   05/09/12 0538  BP: 139/78  Pulse: 83  Temp: 98.8 F (37.1 C)  Resp: 18     Splint c/d/i Wiggles toes NVID  Labs:   [facility] 05/07/12 0427  HGB 12.2*   Basename 05/07/12 0427  WBC 6.2  RBC 3.44*  HCT 34.1*  PLT 121*   Basename 05/09/12 0451 05/08/12 0450  NA 137 135  K 4.1 4.3  CL 103 102  CO2 26 26  BUN 22 25*  CREATININE 1.28 1.40*  GLUCOSE 121* 153*  CALCIUM 10.1 10.2    Assessment and Plan:Doing well NWB Cont PT F/u in my office 10-14 days post op  VTE proph: Lovenox, transition to [facility] as outpt

## 2012-05-09 NOTE — Progress Notes (Signed)
Physical Therapy Treatment Patient Details Name: [name] MRN: [id number] DOB: 06-05-1941 Today's Date: 05/09/2012 Time: 4098-1191 PT Time Calculation (min): 19 min  PT Assessment / Plan / Recommendation Comments on Treatment Session  Pt progressing with mobility, continues to require +2 for safety.  States decreased pain today and tolerated seated exercises.     Follow Up Recommendations  CIR     Does the patient have the potential to tolerate intense rehabilitation     Barriers to Discharge        Equipment Recommendations  Rolling walker with 5" wheels;3 in 1 bedside comode    Recommendations for Other Services Rehab consult  Frequency Min 5X/week   Plan Discharge plan remains appropriate;Frequency remains appropriate    Precautions / Restrictions Precautions Precautions: Fall Restrictions Weight Bearing Restrictions: Yes RLE Weight Bearing: Non weight bearing   Pertinent Vitals/Pain No stated pain, mentioned being sore.     Mobility  Bed Mobility Bed Mobility: Supine to Sit Supine to Sit: 4: Min assist;HOB flat;With rails Details for Bed Mobility Assistance: Assist required for RLE out of bed.   Transfers Transfers: Sit to Stand;Stand to Sit;Stand Pivot Transfers Sit to Stand: 1: +2 Total assist;From bed;With upper extremity assist Sit to Stand: Patient Percentage: 70% Stand to Sit: 1: +2 Total assist;To chair/3-in-1;With upper extremity assist Stand to Sit: Patient Percentage: 70% Stand Pivot Transfers: 1: +2 Total assist Stand Pivot Transfers: Patient Percentage: 70% Details for Transfer Assistance: Assist required to rise, steady and ensure controlled descent with cues for hand placement, safety and techniqe.     Exercises General Exercises - Lower Extremity Quad Sets: Strengthening;Both;10 reps Hip ABduction/ADduction: Strengthening;Right;10 reps;Seated (did ADD w/ pillow sqeeze x 10 reps) Straight Leg Raises: Right;10 reps;AAROM   PT Diagnosis:      PT Problem List:   PT Treatment Interventions:     PT Goals Acute Rehab PT Goals PT Goal Formulation: With patient Time For Goal Achievement: 05/22/12 Potential to Achieve Goals: Good Pt will go Supine/Side to Sit: with modified independence PT Goal: Supine/Side to Sit - Progress: Progressing toward goal Pt will go Sit to Stand: with min assist PT Goal: Sit to Stand - Progress: Progressing toward goal Pt will go Stand to Sit: with supervision PT Goal: Stand to Sit - Progress: Progressing toward goal Pt will Transfer Bed to Chair/Chair to Bed: with min assist PT Transfer Goal: Bed to Chair/Chair to Bed - Progress: Progressing toward goal Pt will Perform Home Exercise Program: with supervision, verbal cues required/provided PT Goal: Perform Home Exercise Program - Progress: Progressing toward goal  Visit Information  Last PT Received On: 05/09/12 Assistance Needed: +2    Subjective Data  Subjective: It doesn't hurt too bad today.  Patient Stated Goal: to return home   Cognition  Overall Cognitive Status: Appears within functional limits for tasks assessed/performed Difficult to assess due to: Hard of hearing/deaf Arousal/Alertness: Awake/alert Orientation Level: Appears intact for tasks assessed Behavior During Session: [facility] for tasks performed    Balance     End of Session PT - End of Session Equipment Utilized During Treatment: Gait belt Activity Tolerance: Patient limited by fatigue;Patient limited by pain Patient left: in chair;with call bell/phone within reach Nurse Communication: Mobility status   GP     Page, Meribeth Mattes 05/09/2012, 8:56 AM

## 2012-05-09 NOTE — H&P (View-Only) (Signed)
Physical Medicine and Rehabilitation Admission H&P    Chief Complaint  Patient presents with  . Loss of Consciousness  : HPI: [name] is a 71 y.o. 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 Mohamed. He was admitted 05/05/12 secondary to episode of syncope. By report patient had received his last chemotherapy treatment 05/01/2012 he had not been eating or drinking much. Syncopal episode was witnessed by a home health aide and also by the wife who called 911. He did not hit his head during the fall per report of wife. He did have complaints of right ankle pain. X-rays demonstrated mild displaced fibular fracture/bimalleolar ankle fracture. Orthopedic services consulted and underwent open reduction internal fixation of right lateral malleolus fracture 05/07/2012 per Dr. Chandler. Advised nonweightbearing right lower extremity. Placed on subcutaneous Lovenox for DVT prophylaxis. Postoperative pain management. Hospital course of echocardiogram completed during workup of syncope that showed ejection fraction 65% grade 1 diastolic dysfunction. Cardiac enzymes and troponin unremarkable. Syncopal event felt to be multi-factorial related to recent chemotherapy. Physical therapy evaluation completed 05/08/2012 with recommendations of physical medicine rehabilitation consult to consider inpatient rehabilitation services. Patient was felt to be good candidate for inpatient rehabilitation services and was admitted for comprehensive rehabilitation program  Review of Systems  Constitutional: Positive for malaise/fatigue.  Gastrointestinal: Positive for nausea.  Musculoskeletal: Positive for myalgias.  Neurological: Positive for dizziness and weakness.  All other systems reviewed and are negative   Past Medical History  Diagnosis Date  . Hypertension   . ALLERGIC RHINITIS   . Malignant  neoplasm of bronchus and lung, unspecified site 01/23/2012   Past Surgical History  Procedure Date  . Wrist surgery 1961    right wrist   Family History  Problem Relation Age of Onset  . Hypertension Sister   . Hypertension Brother   . Hypertension Mother   . Coronary artery disease Mother    Social History:  reports that he quit smoking 4 days ago. His smoking use included Cigarettes. He has a 12.5 pack-year smoking history. He has never used smokeless tobacco. He reports that he does not drink alcohol or use illicit drugs. Allergies: No Known Allergies Medications Prior to Admission  Medication Sig Dispense Refill  . olmesartan-hydrochlorothiazide (BENICAR HCT) 40-12.5 MG per tablet Take 0.5 tablets by mouth daily.       . PRESCRIPTION MEDICATION Inject into the vein once a week. Emend, Aloxi, Platinol and Camptosar infusions every Tuesday.      . prochlorperazine (COMPAZINE) 10 MG tablet Take 10 mg by mouth every 6 (six) hours as needed. For nausea.      . vitamin B-12 (CYANOCOBALAMIN) 1000 MCG tablet Take 1,000 mcg by mouth daily.      . [DISCONTINUED] HYDROcodone-acetaminophen (VICODIN) 5-500 MG per tablet 1/2 to 1 tablet by mouth every 6 hours as needed for pain  30 tablet  0    Home: Home Living Lives With: Spouse Available Help at Discharge: Family Type of Home: House Home Access: Stairs to enter Entrance Stairs-Number of Steps: 1 Home Layout: One level Bathroom Shower/Tub: Walk-in shower Bathroom Toilet: Standard Home Adaptive Equipment: Grab bars in shower;Shower chair with back   Functional History: Prior Function Able to Take Stairs?: Yes Driving: Yes  Functional Status:  Mobility: Bed Mobility Bed Mobility: Supine to Sit Supine to Sit: 4: Min assist;HOB flat;With rails Transfers Transfers: Sit to Stand;Stand to Sit;Stand Pivot Transfers Sit to   Stand: 1: +2 Total assist;From bed;With upper extremity assist Sit to Stand: Patient Percentage: 70% Stand to  Sit: 1: +2 Total assist;To chair/3-in-1;With upper extremity assist Stand to Sit: Patient Percentage: 70% Stand Pivot Transfers: 1: +2 Total assist Stand Pivot Transfers: Patient Percentage: 70% Ambulation/Gait Ambulation/Gait Assistance: Not tested (comment)    ADL: ADL Grooming: Simulated;Set up Where Assessed - Grooming: Unsupported sitting Upper Body Bathing: Simulated;Set up Where Assessed - Upper Body Bathing: Unsupported sitting Lower Body Bathing: Simulated;Moderate assistance Where Assessed - Lower Body Bathing: Supported sit to stand Upper Body Dressing: Simulated;Set up Where Assessed - Upper Body Dressing: Unsupported sitting Lower Body Dressing: Simulated;Moderate assistance Where Assessed - Lower Body Dressing: Supported sit to stand Toilet Transfer: Simulated;+2 Total assistance Toilet Transfer Method: Stand pivot Toilet Transfer Equipment: Other (comment) (to chair) Equipment Used: Rolling walker;Gait belt Transfers/Ambulation Related to ADLs: Min cues for safety when pivoting with RW, hand placement, maintaining NWB status to RLE. ADL Comments: Once in chair, pt utilized urinal with min A.   Cognition: Cognition Arousal/Alertness: Awake/alert Orientation Level: Oriented X4 Cognition Overall Cognitive Status: Appears within functional limits for tasks assessed/performed Difficult to assess due to: Hard of hearing/deaf Arousal/Alertness: Awake/alert Orientation Level: Appears intact for tasks assessed Behavior During Session: WFL for tasks performed   Blood pressure 139/78, pulse 83, temperature 98.8 F (37.1 C), temperature source Oral, resp. rate 18, height 6' 1" (1.854 m), weight 110.1 kg (242 lb 11.6 oz), SpO2 97.00%. Physical Exam  Vitals reviewed.  Constitutional: He appears well-developed and well-nourished.  HENT:  Head: Normocephalic and atraumatic.  Right Ear: External ear normal.  Left Ear: External ear normal.  Eyes: Conjunctivae normal and  EOM are normal. Pupils are equal, round, and reactive to light. Right eye exhibits discharge. Left eye exhibits no discharge.  Pupils round and reactive to light  Neck: Normal range of motion. Neck supple. No JVD present. No tracheal deviation present. No thyromegaly present.  Cardiovascular: Normal rate, regular rhythm and normal heart sounds. Exam reveals no friction rub.  No murmur heard.  Pulmonary/Chest: Effort normal and breath sounds normal. No respiratory distress. He has no wheezes.  Abdominal: Soft. Bowel sounds are normal. He exhibits distension. He exhibits no mass. There is no tenderness. There is no rebound and no guarding.  Obese  Musculoskeletal:  Right LE is appropriately tender.  Lymphadenopathy:  He has no cervical adenopathy.  Neurological: He is alert. He has normal reflexes. No cranial nerve deficit or sensory deficit.  Patient named person and place. He was able to get his appropriate age followed basic commands. RLE NVI. LLE 3-4/5 prox to 5/5 distally. UE are nearly 5/5  Skin:  Psychiatric: He has a normal mood and affect. His behavior is normal. Judgment and thought content normal Right lower extremity is splinted     Results for orders placed during the hospital encounter of 05/05/12 (from the past 48 hour(s))  COMPREHENSIVE METABOLIC PANEL     Status: Abnormal   Collection Time   05/08/12  4:50 AM      Component Value Range Comment   Sodium 135  135 - 145 mEq/L    Potassium 4.3  3.5 - 5.1 mEq/L    Chloride 102  96 - 112 mEq/L    CO2 26  19 - 32 mEq/L    Glucose, Bld 153 (*) 70 - 99 mg/dL    BUN 25 (*) 6 - 23 mg/dL    Creatinine, Ser 1.40 (*) 0.50 - 1.35 mg/dL      Calcium 10.2  8.4 - 10.5 mg/dL    Total Protein 6.7  6.0 - 8.3 g/dL    Albumin 3.2 (*) 3.5 - 5.2 g/dL    AST 14  0 - 37 U/L    ALT 12  0 - 53 U/L    Alkaline Phosphatase 59  39 - 117 U/L    Total Bilirubin 0.5  0.3 - 1.2 mg/dL    GFR calc non Af Amer 49 (*) >90 mL/min    GFR calc Af Amer 57  (*) >90 mL/min   COMPREHENSIVE METABOLIC PANEL     Status: Abnormal   Collection Time   05/09/12  4:51 AM      Component Value Range Comment   Sodium 137  135 - 145 mEq/L    Potassium 4.1  3.5 - 5.1 mEq/L    Chloride 103  96 - 112 mEq/L    CO2 26  19 - 32 mEq/L    Glucose, Bld 121 (*) 70 - 99 mg/dL    BUN 22  6 - 23 mg/dL    Creatinine, Ser 1.28  0.50 - 1.35 mg/dL    Calcium 10.1  8.4 - 10.5 mg/dL    Total Protein 6.3  6.0 - 8.3 g/dL    Albumin 2.9 (*) 3.5 - 5.2 g/dL    AST 12  0 - 37 U/L    ALT 9  0 - 53 U/L    Alkaline Phosphatase 53  39 - 117 U/L    Total Bilirubin 0.5  0.3 - 1.2 mg/dL    GFR calc non Af Amer 55 (*) >90 mL/min    GFR calc Af Amer 63 (*) >90 mL/min    Dg Ankle 2 Views Right  05/07/2012  *RADIOLOGY REPORT*  Clinical Data: Right ankle plate and screw placement  DG C-ARM 1-60 MIN - [facility]PT [facility], RIGHT ANKLE - 2 VIEW  Comparison: Right ankle radiographs - 05/05/2012  Findings:  Two spot intraoperative fluoroscopic images of the right ankle are provided for review.  The patient has undergone side plate fixation of previously noted displaced distal fibular metadiaphysis fracture.  There is an additional crossing cancellous screws seen at the fracture site.  Alignment appears anatomic.  The ankle mortise is preserved.  There is a minimal amount of soft tissue swelling about the operative site.  No radiopaque foreign body.  IMPRESSION: Post ORIF of distal fibular fracture without complicating feature.   Original Report Authenticated By: John Watts V, MD    Dg C-arm 1-60 Min-no Report  05/07/2012  *RADIOLOGY REPORT*  Clinical Data: Right ankle plate and screw placement  DG C-ARM 1-60 MIN - [facility], RIGHT ANKLE - 2 VIEW  Comparison: Right ankle radiographs - 05/05/2012  Findings:  Two spot intraoperative fluoroscopic images of the right ankle are provided for review.  The patient has undergone side plate fixation of previously noted displaced distal fibular metadiaphysis fracture.   There is an additional crossing cancellous screws seen at the fracture site.  Alignment appears anatomic.  The ankle mortise is preserved.  There is a minimal amount of soft tissue swelling about the operative site.  No radiopaque foreign body.  IMPRESSION: Post ORIF of distal fibular fracture without complicating feature.   Original Report Authenticated By: John Watts V, MD     Post Admission Physician Evaluation: 1. Functional deficits secondary  to right lateral malleolus fx s/p ORIF. 2. Patient is admitted to receive collaborative, interdisciplinary care between the physiatrist, rehab nursing   staff, and therapy team. 3. Patient's level of medical complexity and substantial therapy needs in context of that medical necessity cannot be provided at a lesser intensity of care such as a SNF. 4. Patient has experienced substantial functional loss from his/her baseline which was documented above under the "Functional History" and "Functional Status" headings.  Judging by the patient's diagnosis, physical exam, and functional history, the patient has potential for functional progress which will result in measurable gains while on inpatient rehab.  These gains will be of substantial and practical use upon discharge  in facilitating mobility and self-care at the household level. 5. Physiatrist will provide 24 hour management of medical needs as well as oversight of the therapy plan/treatment and provide guidance as appropriate regarding the interaction of the two. 6. 24 hour rehab nursing will assist with bladder management, bowel management, safety, skin/wound care, disease management, medication administration, pain management and patient education  and help integrate therapy concepts, techniques,education, etc. 7. PT will assess and treat for:  Lower extremity strength, range of motion, stamina, balance, functional mobility, safety, adaptive techniques and equipment, pain mgt.  Goals are: mod I. 8. OT will  assess and treat for: ADL's, functional mobility, safety, upper extremity strength, adaptive techniques and equipment.   Goals are: mod I to set up. 9. SLP will assess and treat for: n/a.  Goals are: n/a. 10. Case Management and Social Worker will assess and treat for psychological issues and discharge planning. 11. Team conference will be held weekly to assess progress toward goals and to determine barriers to discharge. 12. Patient will receive at least 3 hours of therapy per day at least 5 days per week. 13. ELOS: 7-10 days      Prognosis:  excellent   Medical Problem List and Plan: 1. Right lateral malleolus fracture after syncopal episode status post ORIF 05/07/2012 2. DVT Prophylaxis/Anticoagulation: Subcutaneous Lovenox. Monitor platelet counts any signs of bleeding 3. Pain Management: Oxycodone as needed. Monitor with increased mobility 4. Neuropsych: This patient is capable of making decisions on his/her own behalf. 5. Hypertension. Presently on no antihypertensive medications. Patient was on Benicar 0.5 mg daily prior to admission. Will monitor the increased mobility 6. History small cell lung cancer. Patient had been receiving chemotherapy and will followup with Dr. Mohamed 7. Chronic kidney disease. Baseline creatinine 1.4. Followup chemistries 8. Obesity. Weight currently 242 pounds 05/09/2012 Zach Zala Degrasse, MD 

## 2012-05-10 DIAGNOSIS — S8263XA Displaced fracture of lateral malleolus of unspecified fibula, initial encounter for closed fracture: Secondary | ICD-10-CM

## 2012-05-10 DIAGNOSIS — S82899A Other fracture of unspecified lower leg, initial encounter for closed fracture: Secondary | ICD-10-CM

## 2012-05-10 DIAGNOSIS — W19XXXA Unspecified fall, initial encounter: Secondary | ICD-10-CM

## 2012-05-10 LAB — VITAMIN D 1,25 DIHYDROXY
Vitamin D 1, 25 (OH)2 Total: 73 pg/mL — ABNORMAL HIGH (ref 18–72)
Vitamin D2 1, 25 (OH)2: <8 pg/mL
Vitamin D3 1, 25 (OH)2: 73 pg/mL

## 2012-05-10 MED ORDER — SENNOSIDES-DOCUSATE SODIUM 8.6-50 MG PO TABS
2.0000 | ORAL_TABLET | Freq: Every day | ORAL | Status: DC
  Administered 2012-05-10 – 2012-05-14 (×5): 2 via ORAL
  Filled 2012-05-10 (×6): qty 2

## 2012-05-10 NOTE — Progress Notes (Signed)
Subjective/Complaints: No bm since Saturday. Feels distended. Pain under control A 12 point review of systems has been performed and if not noted above is otherwise negative.   Objective: Vital Signs: Blood pressure 144/84, pulse 76, temperature 98.5 F (36.9 C), temperature source Oral, resp. rate 18, height 6\' 1"  (1.854 m), weight 109.4 kg (241 lb 2.9 oz), SpO2 93.00%. No results found. No results found for this basename: WBC:2,HGB:2,HCT:2,PLT:2 in the last 72 hours  Basename 05/09/12 0451 05/08/12 0450  NA 137 135  K 4.1 4.3  CL 103 102  GLUCOSE 121* 153*  BUN 22 25*  CREATININE 1.28 1.40*  CALCIUM 10.1 10.2   CBG (last 3)  No results found for this basename: GLUCAP:3 in the last 72 hours  Wt Readings from Last 3 Encounters:  05/10/12 109.4 kg (241 lb 2.9 oz)  05/09/12 110.1 kg (242 lb 11.6 oz)  05/09/12 110.1 kg (242 lb 11.6 oz)    Physical Exam:  Constitutional: He appears well-developed and well-nourished.  HENT:  Head: Normocephalic and atraumatic.  Right Ear: External ear normal.  Left Ear: External ear normal.  Eyes: Conjunctivae normal and EOM are normal. Pupils are equal, round, and reactive to light. Right eye exhibits discharge. Left eye exhibits no discharge.  Pupils round and reactive to light  Neck: Normal range of motion. Neck supple. No JVD present. No tracheal deviation present. No thyromegaly present.  Cardiovascular: Normal rate, regular rhythm and normal heart sounds. Exam reveals no friction rub.  No murmur heard.  Pulmonary/Chest: Effort normal and breath sounds normal. No respiratory distress. He has no wheezes.  Abdominal: Soft. Bowel sounds are normal. He exhibits distension. He exhibits no mass. There is no tenderness. There is no rebound and no guarding.  Obese  Musculoskeletal:  Right LE is appropriately tender, splinted. Lymphadenopathy:  He has no cervical adenopathy.  Neurological: He is alert. He has normal reflexes. No cranial nerve  deficit or sensory deficit.  Patient named person and place. He was able to get his appropriate age followed basic commands. RLE NVI. LLE 3-4/5 prox to 5/5 distally. UE are nearly 5/5  Skin:  Psychiatric: He has a normal mood and affect. His behavior is normal. Judgment and thought content normal        Assessment/Plan: 1. Functional deficits secondary to right lateral malleolus fx which require 3+ hours per day of interdisciplinary therapy in a comprehensive inpatient rehab setting. Physiatrist is providing close team supervision and 24 hour management of active medical problems listed below. Physiatrist and rehab team continue to assess barriers to discharge/monitor patient progress toward functional and medical goals. FIM:                   Comprehension Comprehension Mode: Auditory Comprehension: 6-Follows complex conversation/direction: With extra time/assistive device  Expression Expression Mode: Verbal Expression: 6-Expresses complex ideas: With extra time/assistive device  Social Interaction Social Interaction: 7-Interacts appropriately with others - No medications needed.  Problem Solving Problem Solving: 5-Solves complex 90% of the time/cues < 10% of the time  Memory Memory: 6-More than reasonable amt of time  Medical Problem List and Plan:  1. Right lateral malleolus fracture after syncopal episode status post ORIF 05/07/2012  2. DVT Prophylaxis/Anticoagulation: Subcutaneous Lovenox. Monitor platelet counts any signs of bleeding  3. Pain Management: Oxycodone as needed. Monitor with increased mobility  4. Neuropsych: This patient is capable of making decisions on his/her own behalf.  5. Hypertension. Presently on no antihypertensive medications. Patient was on Benicar  0.5 mg daily prior to admission. Will monitor the increased mobility  6. History small cell lung cancer. Patient had been receiving chemotherapy and will followup with Dr. Arbutus Ped  7. Chronic  kidney disease. Baseline creatinine 1.4. Followup chemistries  8. Obesity. Weight currently 242 pounds 9. Constipation: augment bowel regimen  LOS (Days) 1 A FACE TO FACE EVALUATION WAS PERFORMED  Kenasia Scheller T 05/10/2012 7:11 AM

## 2012-05-11 ENCOUNTER — Encounter (HOSPITAL_COMMUNITY): Payer: Medicare Other | Admitting: Occupational Therapy

## 2012-05-11 ENCOUNTER — Inpatient Hospital Stay (HOSPITAL_COMMUNITY): Payer: Medicare Other | Admitting: Physical Therapy

## 2012-05-11 ENCOUNTER — Inpatient Hospital Stay (HOSPITAL_COMMUNITY): Payer: Medicare Other | Admitting: Occupational Therapy

## 2012-05-11 LAB — COMPREHENSIVE METABOLIC PANEL
ALT: 11 U/L (ref 0–53)
Alkaline Phosphatase: 57 U/L (ref 39–117)
BUN: 27 mg/dL — ABNORMAL HIGH (ref 6–23)
CO2: 27 mEq/L (ref 19–32)
Calcium: 10.7 mg/dL — ABNORMAL HIGH (ref 8.4–10.5)
GFR calc Af Amer: 65 mL/min — ABNORMAL LOW (ref >90)
GFR calc non Af Amer: 56 mL/min — ABNORMAL LOW (ref >90)
Glucose, Bld: 128 mg/dL — ABNORMAL HIGH (ref 70–99)
Potassium: 3.9 mEq/L (ref 3.5–5.1)
Sodium: 141 mEq/L (ref 135–145)
Total Protein: 6.8 g/dL (ref 6.0–8.3)

## 2012-05-11 LAB — CBC WITH DIFFERENTIAL/PLATELET
Eosinophils Absolute: 0.1 10*3/uL (ref 0.0–0.7)
Eosinophils Relative: 1 % (ref 0–5)
Hemoglobin: 11.2 g/dL — ABNORMAL LOW (ref 13.0–17.0)
Lymphocytes Relative: 25 % (ref 12–46)
Lymphs Abs: 1.1 10*3/uL (ref 0.7–4.0)
MCH: 35.2 pg — ABNORMAL HIGH (ref 26.0–34.0)
MCV: 99.1 fL (ref 78.0–100.0)
Monocytes Relative: 10 % (ref 3–12)
Platelets: 140 10*3/uL — ABNORMAL LOW (ref 150–400)
RBC: 3.18 MIL/uL — ABNORMAL LOW (ref 4.22–5.81)
WBC: 4.2 10*3/uL (ref 4.0–10.5)

## 2012-05-11 NOTE — Progress Notes (Signed)
Subjective/Complaints:    A 12 point review of systems has been performed and if not noted above is otherwise negative.   Objective: Vital Signs: Blood pressure 126/79, pulse 79, temperature 99.3 F (37.4 C), temperature source Oral, resp. rate 20, height 6\' 1"  (1.854 m), weight 109.4 kg (241 lb 2.9 oz), SpO2 97.00%. No results found.  Basename 05/11/12 0625  WBC 4.2  HGB 11.2*  HCT 31.5*  PLT 140*    Basename 05/11/12 0625 05/09/12 0451  NA 141 137  K 3.9 4.1  CL 105 103  GLUCOSE 128* 121*  BUN 27* 22  CREATININE 1.26 1.28  CALCIUM 10.7* 10.1   CBG (last 3)  No results found for this basename: GLUCAP:3 in the last 72 hours  Wt Readings from Last 3 Encounters:  05/10/12 109.4 kg (241 lb 2.9 oz)  05/09/12 110.1 kg (242 lb 11.6 oz)  05/09/12 110.1 kg (242 lb 11.6 oz)    Physical Exam:  Constitutional: He appears well-developed and well-nourished.  HENT:  Head: Normocephalic and atraumatic.  Right Ear: External ear normal.  Left Ear: External ear normal.  Eyes: Conjunctivae normal and EOM are normal. Pupils are equal, round, and reactive to light. Right eye exhibits discharge. Left eye exhibits no discharge.  Pupils round and reactive to light  Neck: Normal range of motion. Neck supple. No JVD present. No tracheal deviation present. No thyromegaly present.  Cardiovascular: Normal rate, regular rhythm and normal heart sounds. Exam reveals no friction rub.  No murmur heard.  Pulmonary/Chest: Effort normal and breath sounds normal. No respiratory distress. He has no wheezes.  Abdominal: Soft. Bowel sounds are normal. He exhibits distension. He exhibits no mass. There is no tenderness. There is no rebound and no guarding.  Obese  Musculoskeletal:  Right LE is appropriately tender, splinted. Lymphadenopathy:  He has no cervical adenopathy.  Neurological: He is alert. He has normal reflexes. No cranial nerve deficit or sensory deficit.  Patient named person and place.  He was able to get his appropriate age followed basic commands. RLE NVI. LLE 3-4/5 prox to 5/5 distally. UE are nearly 5/5  Skin:  Psychiatric: He has a normal mood and affect. His behavior is normal. Judgment and thought content normal        Assessment/Plan: 1. Functional deficits secondary to right lateral malleolus fx which require 3+ hours per day of interdisciplinary therapy in a comprehensive inpatient rehab setting. Physiatrist is providing close team supervision and 24 hour management of active medical problems listed below. Physiatrist and rehab team continue to assess barriers to discharge/monitor patient progress toward functional and medical goals. FIM: FIM - Bathing Bathing Steps Patient Completed: Chest;Right Arm;Left Arm;Abdomen;Front perineal area;Buttocks;Right upper leg;Left upper leg;Left lower leg (including foot) (right lower leg in soft cast) Bathing: 4: Min-Patient completes 8-9 57f 10 parts or 75+ percent  FIM - Upper Body Dressing/Undressing Upper body dressing/undressing: 0: Wears gown/pajamas-no public clothing FIM - Lower Body Dressing/Undressing Lower body dressing/undressing: 0: Wears gown/pajamas-no public clothing  FIM - Toileting Toileting steps completed by patient: Performs perineal hygiene;Adjust clothing after toileting;Adjust clothing prior to toileting (gown) Toileting: 5: Set-up assist to: Obtain supplies  FIM - Diplomatic Services operational officer Devices: Bedside commode Toilet Transfers: 4-To toilet/BSC: Min A (steadying Pt. > 75%)  FIM - Bed/Chair Transfer Bed/Chair Transfer Assistive Devices: Bed rails;Arm rests Bed/Chair Transfer: 4: Bed > Chair or W/C: Min A (steadying Pt. > 75%)  FIM - Locomotion: Wheelchair Locomotion: Wheelchair: 0: Activity did not occur  FIM - Locomotion: Ambulation Locomotion: Ambulation: 0: Activity did not occur  Comprehension Comprehension Mode: Auditory Comprehension: 6-Follows complex  conversation/direction: With extra time/assistive device  Expression Expression Mode: Verbal Expression: 6-Expresses complex ideas: With extra time/assistive device  Social Interaction Social Interaction: 7-Interacts appropriately with others - No medications needed.  Problem Solving Problem Solving: 6-Solves complex problems: With extra time  Memory Memory: 6-More than reasonable amt of time  Medical Problem List and Plan:  1. Right lateral malleolus fracture after syncopal episode status post ORIF 05/07/2012  2. DVT Prophylaxis/Anticoagulation: Subcutaneous Lovenox. Monitor platelet counts any signs of bleeding  3. Pain Management: Oxycodone as needed. Monitor with increased mobility  4. Neuropsych: This patient is capable of making decisions on his/her own behalf.  5. Hypertension. Presently on no antihypertensive medications. Patient was on Benicar 0.5 mg daily prior to admission. Will monitor the increased mobility  6. History small cell lung cancer. Patient had been receiving chemotherapy and will followup with Dr. Arbutus Ped  7. Chronic kidney disease. Baseline creatinine 1.4. Followup chemistries  -mild increase in BUN- push fluids and recheck BMET on Monday  -sodium up to 128   8. Obesity. Weight currently 242 pounds 9. Constipation: augmented bowel regimen  -had bm yesterday  LOS (Days) 2 A FACE TO FACE EVALUATION WAS PERFORMED  [name] T 05/11/2012 7:52 AM

## 2012-05-11 NOTE — Evaluation (Signed)
Occupational Therapy Assessment and Plan and Daily Notes  Patient Details  Name: [name] MRN: [id number] Date of Birth: 22-Dec-1940  OT Diagnosis: acute pain and muscle weakness (generalized) Rehab Potential: Rehab Potential: Good ELOS: 10-12 days   Today's Date: 05/11/2012 Time: 4696-2952 Time Calculation (min): 55 min  Problem List:  Patient Active Problem List  Diagnosis  . Right lower lobe lung mass  . Malignant neoplasm of bronchus and lung, unspecified site  . Small cell lung carcinoma  . Syncope and collapse  . HTN (hypertension)  . CKD (chronic kidney disease) stage 3, GFR 30-59 ml/min  . Thrombocytopenia  . Heart murmur  . Right ankle pain  . Ankle fracture, right  . Acute kidney injury  . Ankle fracture    Past Medical History:  Past Medical History  Diagnosis Date  . Hypertension   . ALLERGIC RHINITIS   . Malignant neoplasm of bronchus and lung, unspecified site 01/23/2012   Past Surgical History:  Past Surgical History  Procedure Date  . Wrist surgery 1961    right wrist  . Orif ankle fracture 05/07/2012    Procedure: OPEN REDUCTION INTERNAL FIXATION (ORIF) ANKLE FRACTURE;  Surgeon: Mable Paris, MD;  Location: [facility];  Service: Orthopedics;  Laterality: Right;    Assessment & Plan Clinical Impression: Patient is a 71 y.o. year old right-handed male with history of hypertension, chronic kidney disease with creatinine 1.4, small cell lung cancer and actively receiving chemotherapy per Dr. Arbutus Ped. He was admitted 05/05/12 secondary to episode of syncope. By report patient had received his last chemotherapy treatment 05/01/2012 he had not been eating or drinking much. Syncopal episode was witnessed by a home health aide and also by the wife who called 911. He did not hit his head during the fall per report of wife. He did have complaints of right ankle pain. X-rays demonstrated mild displaced fibular fracture/bimalleolar ankle fracture.  Orthopedic services consulted and underwent open reduction internal fixation of right lateral malleolus fracture 05/07/2012 per Dr. Ave Filter. Advised nonweightbearing right lower extremity. Placed on subcutaneous Lovenox for DVT prophylaxis. Postoperative pain management. Hospital course of echocardiogram completed during workup of syncope that showed ejection fraction 65% grade 1 diastolic dysfunction. Cardiac enzymes and troponin unremarkable. Syncopal event felt to be multi-factorial related to recent chemotherapy.   Patient transferred to CIR on 05/09/2012 .    Patient currently requires min to mod A with basic self-care skills and with basic mobility secondary to muscle weakness and acute pain, decreased cardiorespiratoy endurance and decreased standing balance, decreased balance strategies and difficulty maintaining precautions.  Prior to hospitalization, patient could complete ADL with independently for basic ADLs and for IADLs he had an Aide.  Patient will benefit from skilled intervention to decrease level of assist with basic self-care skills and increase independence with basic self-care skills prior to discharge home with care partner.  Anticipate patient will require intermittent supervision and follow up home health.  OT - End of Session Activity Tolerance: Tolerates 30+ min activity with multiple rests Endurance Deficit: Yes Endurance Deficit Description: reports endurance is down from receiving chem tx prior to this fall OT Assessment Rehab Potential: Good OT Plan OT Frequency: 1-2 X/day, 60-90 minutes Estimated Length of Stay: 10-12 days OT Treatment/Interventions: Balance/vestibular training;Community reintegration;Discharge planning;Neuromuscular re-education;Psychosocial support;UE/LE Coordination activities;Wheelchair propulsion/positioning;UE/LE Strength taining/ROM;Therapeutic Activities;Self Care/advanced ADL retraining;Pain management;Patient/family education;Functional  mobility training;Therapeutic Exercise OT Recommendation Follow Up Recommendations: Home health OT  OT Evaluation Precautions/Restrictions  Precautions Precautions: Fall Restrictions Weight Bearing Restrictions:  Yes RLE Weight Bearing: Non weight bearing General Chart Reviewed: Yes Family/Caregiver Present: No    Pain Pain Assessment Pain Assessment: No/denies pain ("feels okay for now" ) Pain Score:   6- told RN Home Living/Prior Functioning Home Living Lives With: Spouse Available Help at Discharge: Family Type of Home: House Home Layout: One level Bathroom Shower/Tub: Health visitor: Standard Home Adaptive Equipment: Shower chair without back;Shower chair with back ADL   Vision/Perception  Vision - History Baseline Vision: Wears glasses only for reading Patient Visual Report: No change from baseline Vision - Assessment Eye Alignment: Within Functional Limits Perception Perception: Within Functional Limits Praxis Praxis: Intact  Cognition Overall Cognitive Status: Appears within functional limits for tasks assessed Orientation Level: Oriented X4 Memory: Impaired Memory Impairment: Decreased recall of new information;Decreased short term memory Safety/Judgment: Appears intact Sensation Sensation Light Touch: Appears Intact Stereognosis: Appears Intact Proprioception: Appears Intact Coordination Gross Motor Movements are Fluid and Coordinated: Yes Fine Motor Movements are Fluid and Coordinated: Yes Motor  Motor Motor - Skilled Clinical Observations: generalized weakness Mobility  Bed Mobility Supine to Sit: 5: Supervision Transfers Sit to Stand: 4: Min assist Stand to Sit: 4: Min assist  Trunk/Postural Assessment  Cervical Assessment Cervical Assessment: Within Functional Limits Thoracic Assessment Thoracic Assessment: Within Functional Limits Lumbar Assessment Lumbar Assessment:  (posterior pelvic tilt) Postural Control Postural  Control: Within Functional Limits  Balance Balance Balance Assessed: Yes Static Sitting Balance Static Sitting - Level of Assistance: 7: Independent Dynamic Sitting Balance Dynamic Sitting - Balance Support: During functional activity Dynamic Sitting - Level of Assistance: 5: Stand by assistance Static Standing Balance Static Standing - Balance Support: During functional activity Static Standing - Level of Assistance: 4: Min assist Extremity/Trunk Assessment RUE Assessment RUE Assessment: Within Functional Limits LUE Assessment LUE Assessment: Within Functional Limits  See FIM for current functional status Refer to Care Plan for Long Term Goals  Recommendations for other services: None  Discharge Criteria: Patient will be discharged from OT if patient refuses treatment 3 consecutive times without medical reason, if treatment goals not met, if there is a change in medical status, if patient makes no progress towards goals or if patient is discharged from hospital.  The above assessment, treatment plan, treatment alternatives and goals were discussed and mutually agreed upon: by patient  1:1 OT evaluation with OT goals, purpose and role discussed. Self care retraining at sink level including bed mobility, transfer bed to [facility] to w/c, sit to stands, maintaining NWB precautions with basic mobility, activity tolerance/ endurance, bathing and dressing with strategies for success.  Roney Mans [facility] 05/11/2012, 11:24 AM

## 2012-05-11 NOTE — Progress Notes (Signed)
Physical Therapy Session Note  Patient Details  Name: [name] MRN: [id number] Date of Birth: [birth year]  Today's Date: 05/11/2012 Time: 1300-1330 Time Calculation (min): 30 min  Short Term Goals: Week 1:  PT Short Term Goal 1 (Week 1): Same as LTGs  Skilled Therapeutic Interventions/Progress Updates:  Instructed pt in use of knee walker, performing multiple trials up to 160' with supervision and transferring on and off with min@.  Pt saying he thought he just needed to get used to it.  W/c mobility on unit 120' with supervision, using UEs.  Pt returning to bed after session and requesting pain meds to help him relax.  Therapy Documentation Precautions:  Precautions Precautions: Fall Restrictions Weight Bearing Restrictions: Yes RLE Weight Bearing: Non weight bearing Pain: Pain Assessment Pain Assessment: No/denies pain ("it's feeling better") See FIM for current functional status  Therapy/Group: Individual Therapy  Georges Mouse 05/11/2012, 2:04 PM

## 2012-05-11 NOTE — Progress Notes (Signed)
Patient information reviewed and entered into eRehab system by Lashun Ramseyer, RN, CRRN, PPS Coordinator.  Information including medical coding and functional independence measure will be reviewed and updated through discharge.     Per nursing patient was given "Data Collection Information Summary for Patients in Inpatient Rehabilitation Facilities with attached "Privacy Act Statement-Health Care Records" upon admission.  

## 2012-05-11 NOTE — Progress Notes (Signed)
Occupational Therapy Session Note  Patient Details  Name: [name] MRN: [id number] Date of Birth: [birth year]  Today's Date: 05/11/2012 Time: 1300-1330 Time Calculation (min): 30 min  Short Term Goals: Week 1:  OT Short Term Goal 1 (Week 1): Pt will transfer to toilet/ BSC with supervision with min cuing for NWB on right LE OT Short Term Goal 2 (Week 1): Pt will perform toileting supervision sit to stand OT Short Term Goal 3 (Week 1): Pt will perform all sit to stands with supervision with only 1 cue for precautions OT Short Term Goal 4 (Week 1): Pt will endure 20 min of activity before rest break to improve activity tolerance  Skilled Therapeutic Interventions/Progress Updates:  Balance/vestibular training;Community reintegration;Discharge planning;Neuromuscular re-education;Psychosocial support;UE/LE Coordination activities;Wheelchair propulsion/positioning;UE/LE Strength taining/ROM;Therapeutic Activities;Self Care/advanced ADL retraining;Pain management;Patient/family education;Functional mobility training;Therapeutic Exercise   1:1 focus on functional ambulation with RW (hopping) from bed to toilet in bathroom and then out to w/c, toileting sit to stand, home setup in bathroom and how to cross over threshold of shower stall with RW safety. Pt needed min A with cuing for RW safety with turning and to slow down and pace self. Pt performed better with maintaining WB precautions then this morning.  Therapy Documentation Precautions:  Precautions Precautions: Fall Restrictions Weight Bearing Restrictions: Yes RLE Weight Bearing: Non weight bearing General: General Chart Reviewed: Yes Family/Caregiver Present: No    Pain: Pain Assessment Pain Assessment: No/denies pain   See FIM for current functional status  Therapy/Group: Individual Therapy  Roney Mans [facility] 05/11/2012, 1:29 PM

## 2012-05-11 NOTE — Evaluation (Signed)
Physical Therapy Assessment and Plan  Patient Details  Name: [name] MRN: [id number] Date of Birth: 1941/01/30  PT Diagnosis: Difficulty walking, Muscle weakness and Pain in joint Rehab Potential: Good ELOS: 7-10 days   Today's Date: 05/11/2012 Time: 1102-1200 Time Calculation (min): 58 min  Problem List:  Patient Active Problem List  Diagnosis  . Right lower lobe lung mass  . Malignant neoplasm of bronchus and lung, unspecified site  . Small cell lung carcinoma  . Syncope and collapse  . HTN (hypertension)  . CKD (chronic kidney disease) stage 3, GFR 30-59 ml/min  . Thrombocytopenia  . Heart murmur  . Right ankle pain  . Ankle fracture, right  . Acute kidney injury  . Ankle fracture    Past Medical History:  Past Medical History  Diagnosis Date  . Hypertension   . ALLERGIC RHINITIS   . Malignant neoplasm of bronchus and lung, unspecified site 01/23/2012   Past Surgical History:  Past Surgical History  Procedure Date  . Wrist surgery 1961    right wrist  . Orif ankle fracture 05/07/2012    Procedure: OPEN REDUCTION INTERNAL FIXATION (ORIF) ANKLE FRACTURE;  Surgeon: Mable Paris, MD;  Location: [facility];  Service: Orthopedics;  Laterality: Right;    Assessment & Plan Clinical Impression: Patient is a 71 y.o. year old male with recent admission to the hospital on 05/07/12 with R ankle fracture.  Patient transferred to CIR on 05/09/2012 .   Patient currently requires min with mobility secondary to muscle weakness and decreased standing balance and decreased balance strategies.  Prior to hospitalization, patient was modified independent with mobility and lived with Spouse in a House home.  Home access is 1Stairs to enter.  Patient will benefit from skilled PT intervention to maximize safe functional mobility, minimize fall risk and decrease caregiver burden for planned discharge home with intermittent assist.  Anticipate patient will benefit from follow  up [facility] at discharge.  PT - End of Session Activity Tolerance: Tolerates 30+ min activity with multiple rests Endurance Deficit: Yes Endurance Deficit Description: reports endurance is down from receiving chem tx prior to this fall PT Assessment Rehab Potential: Good Barriers to Discharge: Decreased caregiver support PT Plan PT Frequency: 2-3 X/day, 60-90 minutes Estimated Length of Stay: 7-10 days PT Treatment/Interventions: Ambulation/gait training;Balance/vestibular training;Community reintegration;Discharge planning;DME/adaptive equipment instruction;Functional mobility training;Pain management;Patient/family education;Stair training;Therapeutic Activities;Therapeutic Exercise;UE/LE Strength taining/ROM;Wheelchair propulsion/positioning PT Recommendation Follow Up Recommendations: Home health PT Equipment Recommended: Rolling walker with 5" wheels;Wheelchair (measurements);Wheelchair cushion (measurements)  PT Evaluation Precautions/Restrictions Precautions Precautions: Fall Restrictions Weight Bearing Restrictions: Yes RLE Weight Bearing: Non weight bearing Pain Pain Assessment Pain Assessment: No/denies pain ("it's feeling better") Home Living/Prior Functioning Home Living Lives With: Spouse Available Help at Discharge: Family Type of Home: House Home Access: Stairs to enter Secretary/administrator of Steps: 1 Entrance Stairs-Rails: None Home Layout: One level Bathroom Shower/Tub: Health visitor: Standard Home Adaptive Equipment: Shower chair without back;Shower chair with back Prior Function Level of Independence: Independent with basic ADLs;Independent with homemaking with ambulation;Independent with gait;Independent with transfers Able to Take Stairs?: Reciprically Driving: Yes Vocation: Retired Optometrist - History Baseline Vision: Wears glasses only for reading Patient Visual Report: No change from baseline Vision -  Assessment Eye Alignment: Within Functional Limits Perception Perception: Within Functional Limits Praxis Praxis: Intact  Cognition Overall Cognitive Status: Appears within functional limits for tasks assessed Orientation Level: Oriented X4 Memory: Impaired Memory Impairment: Decreased recall of new information;Decreased short term memory Safety/Judgment: Appears  intact Sensation Sensation Light Touch: Appears Intact Stereognosis: Appears Intact Proprioception: Appears Intact Coordination Gross Motor Movements are Fluid and Coordinated: Yes Fine Motor Movements are Fluid and Coordinated: Yes Motor  Motor Motor - Skilled Clinical Observations: generalized weakness  Mobility Bed Mobility Bed Mobility: Sit to Supine Supine to Sit: 5: Supervision Sit to Supine: 5: Supervision Transfers Sit to Stand: 4: Min assist Stand to Sit: 4: Min assist Stand Pivot Transfers: 4: Min assist Locomotion  Ambulation Ambulation: Yes Ambulation/Gait Assistance: 4: Min Environmental consultant (Feet): 20 Feet x2 Assistive device: Rolling walker Stairs / Additional Locomotion Stairs: Yes Stairs Assistance: 1: +2 Total assist Stair Management Technique: Two rails Number of Stairs: 1  Height of Stairs: 4  Ramp: 1: +2 Total assist  Trunk/Postural Assessment  Cervical Assessment Cervical Assessment: Within Functional Limits Thoracic Assessment Thoracic Assessment: Within Functional Limits Lumbar Assessment Lumbar Assessment:  (posterior pelvic tilt) Postural Control Postural Control: Within Functional Limits  Balance Balance Balance Assessed: Yes Static Sitting Balance Static Sitting - Level of Assistance: 7: Independent Dynamic Sitting Balance Dynamic Sitting - Balance Support: During functional activity Dynamic Sitting - Level of Assistance: 5: Stand by assistance Static Standing Balance Static Standing - Balance Support: Bilateral upper extremity supported Static Standing -  Level of Assistance: 4: Min assist (RW) Extremity Assessment  RUE Assessment RUE Assessment: Within Functional Limits LUE Assessment LUE Assessment: Within Functional Limits RLE Assessment RLE Assessment: Not tested  LLE Assessment LLE Assessment: Within Functional Limits  See FIM for current functional status Refer to Care Plan for Long Term Goals  Recommendations for other services: None  Discharge Criteria: Patient will be discharged from PT if patient refuses treatment 3 consecutive times without medical reason, if treatment goals not met, if there is a change in medical status, if patient makes no progress towards goals or if patient is discharged from hospital.  The above assessment, treatment plan, treatment alternatives and goals were discussed and mutually agreed upon: by patient  Treatment:  Pt performed NuStep x 10 min with bilateral UEs. And LLE for strengthening exercise x 10 min on workload = 5.  Georges Mouse 05/11/2012, 12:07 PM

## 2012-05-12 ENCOUNTER — Inpatient Hospital Stay (HOSPITAL_COMMUNITY): Payer: Medicare Other | Admitting: Physical Therapy

## 2012-05-12 ENCOUNTER — Encounter (HOSPITAL_COMMUNITY): Payer: Medicare Other | Admitting: Occupational Therapy

## 2012-05-12 NOTE — Progress Notes (Signed)
Physical Therapy Session Note  Patient Details  Name: [name] MRN: [id number] Date of Birth: 1941-04-09  Today's Date: 05/12/2012 Time: 1400-1500 Time Calculation (min): 60 min  Short Term Goals: Week 1:  PT Short Term Goal 1 (Week 1): Same as LTGs  Therapy Documentation Precautions:  Precautions Precautions: Fall Restrictions Weight Bearing Restrictions: Yes RLE Weight Bearing: Non weight bearing   Pain: Pain Assessment: 0-10 Pain Score:   3 Pain Type: Surgical pain Pain Location: Leg Pain Orientation: Right Pain Descriptors: Aching Pain Frequency: Intermittent Pain Onset: Gradual Pain Intervention(s): Medication (See eMAR)  Therapeutic Activity:(15') Transfer Training sit<->stand with S/CGA patient needing verbal cues for safety                                          BP monitored with standing (seated rest = 128/88 baseline, then initial standing = 116/81 and 124/87 after one minute of standing) Gait Training:(15') using RW x 50' while maintaining NWB R LE,  1 x 15' inside room negotiating turns Therapeutic Exercise:(30') B LE's in supine and in sitting.    Upper Body Ergometer/cycle x 15' with 1 minute rest breaks, Level 2 and 3                        See FIM for current functional status  Therapy/Group: Individual Therapy  [name] J 05/12/2012, 2:12 PM

## 2012-05-12 NOTE — Progress Notes (Signed)
Physical Therapy Note  Patient Details  Name: Antone Summons MRN: [id number] Date of Birth: 1940/09/28 Today's Date: 05/12/2012  8469-6295 (55 minutes) individual7 Pain : 6/10 right ankle/ nurse notified /meds given BP 147/92 pulse 107 resting Focus of treatment: Therapeutic exercises to improve tolerance to activity/ bilateral UE strengthening; gait training with RW NWB RT LE Treatment: wc mobility- SBA unit 120 feet level; transfers- stand/pivot RW NWB RT LE with instructional cues for stepping vs hopping LT LE; Nustep Level 5 X 10 minutes for activity tolerance (perceived exertion = 11 ) ; gait 25 feet RW SBA limited by fatigue (pulse 106) (pt reports he is not interested in using knee walker); Up/down curb with RW - pt unable to perform going forward/ backward min assist for safety only; pt instructed in wc pushups for UE strengthening X 10.   Susi Goslin,JIM 05/12/2012, 7:44 AM

## 2012-05-12 NOTE — Progress Notes (Signed)
Subjective/Complaints:  no complaints except modest pain of ankle (only when leg is in dependent position)  Objective: Vital Signs: Blood pressure 137/77, pulse 74, temperature 98.8 F (37.1 C), temperature source Oral, resp. rate 17, height 6\' 1"  (1.854 m), weight 241 lb 2.9 oz (109.4 kg), SpO2 97.00%.   Wt Readings from Last 3 Encounters:  05/10/12 241 lb 2.9 oz (109.4 kg)  05/09/12 242 lb 11.6 oz (110.1 kg)  05/09/12 242 lb 11.6 oz (110.1 kg)    Physical Exam:  NAD Chest CTA-  CV- REG RATE abd- obese, soft Extremities: no edema (cast on right ankle)  Assessment/Plan: 1. Functional deficits secondary to right lateral malleolus fx which require 3+ hours per day of interdisciplinary therapy in a comprehensive inpatient rehab setting. Physiatrist is providing close team supervision and 24 hour management of active medical problems listed below. Physiatrist and rehab team continue to assess barriers to discharge/monitor patient progress toward functional and medical goals.  Medical Problem List and Plan:  1. Right lateral malleolus fracture after syncopal episode status post ORIF 05/07/2012  2. DVT Prophylaxis/Anticoagulation: Subcutaneous Lovenox.  3. Pain Management: Oxycodone as needed. Monitor with increased mobility  4. Neuropsych: This patient is capable of making decisions on his/her own behalf.  5. Hypertension. Presently on no antihypertensive medications. Patient was on Benicar 0.5 mg daily prior to admission. Will monitor the increased mobility  BP Readings from Last 3 Encounters:  05/12/12 137/77  05/09/12 135/74  05/09/12 135/74   6. History small cell lung cancer. Patient had been receiving chemotherapy and will followup with Dr. Arbutus Ped  7. Chronic kidney disease. Baseline creatinine 1.4. Followup chemistries   Basic Metabolic Panel:    Component Value Date/Time   NA 141 05/11/2012 0625   NA 140 05/01/2012 0921   K 3.9 05/11/2012 0625   K 4.5 05/01/2012  0921   CL 105 05/11/2012 0625   CL 109* 05/01/2012 0921   CO2 27 05/11/2012 0625   CO2 24 05/01/2012 0921   BUN 27* 05/11/2012 0625   BUN 31.0* 05/01/2012 0921   CREATININE 1.26 05/11/2012 0625   CREATININE 1.7* 05/01/2012 0921   GLUCOSE 128* 05/11/2012 0625   GLUCOSE 164* 05/01/2012 0921   CALCIUM 10.7* 05/11/2012 0625   CALCIUM 10.3 05/01/2012 0921     8. Obesity. Weight currently 242 pounds 9. Constipation: augmented bowel regimen  No complaints today  LOS (Days) 3 A FACE TO FACE EVALUATION WAS PERFORMED  [facility] HENRY 05/12/2012 5:44 AM

## 2012-05-12 NOTE — Progress Notes (Signed)
Occupational Therapy Session Note  Patient Details  Name: [name] MRN: [id number] Date of Birth: 1941/01/30  Today's Date: 05/12/2012 Time: 1005-1100 Time Calculation (min): 55 min  Short Term Goals: Week 1:  OT Short Term Goal 1 (Week 1): Pt will transfer to toilet/ BSC with supervision with min cuing for NWB on right LE OT Short Term Goal 2 (Week 1): Pt will perform toileting supervision sit to stand OT Short Term Goal 3 (Week 1): Pt will perform all sit to stands with supervision with only 1 cue for precautions OT Short Term Goal 4 (Week 1): Pt will endure 20 min of activity before rest break to improve activity tolerance  Skilled Therapeutic Interventions/Progress Updates: Patient seen for individual OT this am to address his ability to complete basic self cares safely and effectively.  Patient states that he hopes to be as independent as possible as his wife has back problems.  Patient able to sponge bathe at the sink with set up assistance.  Patient practiced stepping over lip as needed for his shower at home.  Patient able to effectively hop over 3 inch lip using walker facing forward.     Therapy Documentation Precautions:  Precautions Precautions: Fall Restrictions Weight Bearing Restrictions: Yes RLE Weight Bearing: Non weight bearing   Pain: 6/10 right ankle. Called for pain medicine See FIM for current functional status  Therapy/Group: Individual Therapy  Collier Salina 05/12/2012, 12:42 PM

## 2012-05-13 ENCOUNTER — Inpatient Hospital Stay (HOSPITAL_COMMUNITY): Payer: Medicare Other | Admitting: Physical Therapy

## 2012-05-13 ENCOUNTER — Inpatient Hospital Stay (HOSPITAL_COMMUNITY): Payer: Medicare Other | Admitting: Occupational Therapy

## 2012-05-13 ENCOUNTER — Inpatient Hospital Stay (HOSPITAL_COMMUNITY): Payer: Medicare Other

## 2012-05-13 NOTE — Progress Notes (Signed)
Occupational Therapy Session Note  Patient Details  Name: [name] MRN: [id number] Date of Birth: [birth year]  Today's Date: 05/13/2012 Time: 3086-5784 Time Calculation (min): 51 min  Short Term Goals: Week 1:  OT Short Term Goal 1 (Week 1): Pt will transfer to toilet/ BSC with supervision with min cuing for NWB on right LE OT Short Term Goal 2 (Week 1): Pt will perform toileting supervision sit to stand OT Short Term Goal 3 (Week 1): Pt will perform all sit to stands with supervision with only 1 cue for precautions OT Short Term Goal 4 (Week 1): Pt will endure 20 min of activity before rest break to improve activity tolerance  Skilled Therapeutic Interventions/Progress Updates:    Pt was seen for individual OT treatment session today with focus on activity tolerance, safety awareness, dynamic/static standing balance, and energy conservation techniques. Pt was in bed upon OT arrival but was agreeable to treatment session. Overall pt was steady assist functional mobility from bed into bathroom & Min A toilet transfer/clothing negotiation, utilizing RW, grab bars. Pt maintained NWB RLE but required frequent VC's for positioning/safety w/ RW. Pt then self propelled w/c to rehab gym where he performed exercises (UBE seated w/c, level 2.5 x36min) for bilateral UE strengthening in preparation for increased Independence/endurance w/ transfers and functional mobility. Pt was also brought into ADL kitchen where dynamic & static standing balance & transfers were performed. At conclusion of session, pt was seated at EOB eating lunch, call bell & phone were within reach, RN was aware and stated that she would re apply bed alarm at conclusion of meal.   Therapy Documentation Precautions:  Precautions Precautions: Fall Restrictions Weight Bearing Restrictions: Yes RLE Weight Bearing: Non weight bearing     Pain: Pain Assessment Pain Assessment: 0-10 Pain Score:   7 Pain Type: Surgical pain Pain  Location: Ankle Pain Orientation: Right Pain Descriptors: Aching;Sore Pain Frequency: Intermittent Pain Onset: Gradual Pain Intervention(s): Repositioned;Ambulation/increased activity;Other (Comment) (Pt states that he was premedicated) Multiple Pain Sites: No       See FIM for current functional status  Therapy/Group: Individual Therapy  Alm Bustard 05/13/2012, 12:10 PM

## 2012-05-13 NOTE — Progress Notes (Signed)
Occupational Therapy Session Note  Patient Details  Name: [name] MRN: [id number] Date of Birth: 1941/05/12  Today's Date: 05/13/2012 Time:  - 4098-1191 Time Calculation (min): 60 min  Short Term Goals: Week 1:  OT Short Term Goal 1 (Week 1): Pt will transfer to toilet/ BSC with supervision with min cuing for NWB on right LE OT Short Term Goal 2 (Week 1): Pt will perform toileting supervision sit to stand OT Short Term Goal 3 (Week 1): Pt will perform all sit to stands with supervision with only 1 cue for precautions OT Short Term Goal 4 (Week 1): Pt will endure 20 min of activity before rest break to improve activity tolerance  Skilled Therapeutic Interventions/Progress Updates:    Pt in bed upon arrival but agreeable to participating in bathing and dressing tasks at sink.  Pt requested to use toilet before bathing and amb with RW to bathroom.  Pt maintained weight bearing restrictions during ambulation to bathroom; hopping on LLE.  Pt completed toileting and bathing/drssing tasks with sit<>stand from recliner at sink with set up except for steady A when standing to pull up pants.  Focus on activity tolerance, safety awareness, dynamic standing balance, and energy conservation strategies.  Therapy Documentation Precautions:  Precautions Precautions: Fall Restrictions Weight Bearing Restrictions: Yes RLE Weight Bearing: Non weight bearing  Pain: Pain Assessment Pain Assessment: 0-10 Pain Score:   6 Pain Location: Ankle Pain Descriptors: Aching Pain Onset: Gradual Pain Intervention(s): Repositioned;RN made aware  See FIM for current functional status  Therapy/Group: Individual Therapy  Rich Brave 05/13/2012, 9:47 AM

## 2012-05-13 NOTE — Progress Notes (Signed)
Physical Therapy Session Note  Patient Details  Name: [name] MRN: [id number] Date of Birth: Oct 26, 1940  Today's Date: 05/13/2012 Time: 3086-5784 Time Calculation (min): 62 min  Short Term Goals: Week 1:  PT Short Term Goal 1 (Week 1): Same as LTGs  Skilled Therapeutic Interventions/Progress Updates:    This session focused on bed mobility, transfers from various surfaces, gait with RW, car transfers, step practice and TE.  WC to Bed and back supervision, cues for true NWB.  WC mobility 150' indoor tiled surfaces supervision for safety issues and cues for parts management.  Car transfer to [employer] supervision with cues for safety (even with cues not to pull on the door, pt still wanted to pull on the door).  Gait with RW 35' x 1, 25' x 1 with min assist for balance, verbal cues for upright posture.  Adjusted walker up one notch to see if this would help with posture (maybe a little, but pt likes to watch his feet).  Chair to follow to try to encourage gait as far as he could.  Stair practice backwards with RW up and down 4" step with min assist.  Verbal and visual cues for technique.  At this point he is not strong enough to hop forwards.  Mat table exercises: quad sets, heel slides, SLR, Hip abduction, adduction, SAQ, 1/2 bridge (left), LAQs , seated marches and 1/2 sit to stands x 10 each bil.  Ankle pumps left x 20, toe wiggles right x 20 seconds.  Bed mobility mod I due to extra time needed to complete task, and if available pt likes to use bed rails for support.      Therapy Documentation Precautions:  Precautions Precautions: Fall Restrictions Weight Bearing Restrictions: Yes RLE Weight Bearing: Non weight bearing    Pain: Pain Assessment Pain Assessment: 0-10 Pain Score:   7 Pain Type: Surgical pain Pain Location: Ankle Pain Orientation: Right Pain Descriptors: Aching;Sore Pain Frequency: Intermittent Pain Onset: Gradual Pain Intervention(s):  Repositioned;Ambulation/increased activity;Other (Comment) (Pt states that he was premedicated) Multiple Pain Sites: No Mobility:   Locomotion : Ambulation Ambulation/Gait Assistance: 4: Min assist Wheelchair Mobility Distance: 150   See FIM for current functional status  Therapy/Group: Individual Therapy  [name] B. Carl Bleecker, PT, DPT [phone]   05/13/2012, 12:29 PM

## 2012-05-13 NOTE — Progress Notes (Signed)
Subjective/Complaints:  ankle feels better tody Eager to go home  Objective: Vital Signs: Blood pressure 136/81, pulse 67, temperature 98.1 F (36.7 C), temperature source Oral, resp. rate 18, height 6\' 1"  (1.854 m), weight 241 lb 2.9 oz (109.4 kg), SpO2 98.00%.   Wt Readings from Last 3 Encounters:  05/10/12 241 lb 2.9 oz (109.4 kg)  05/09/12 242 lb 11.6 oz (110.1 kg)  05/09/12 242 lb 11.6 oz (110.1 kg)    Physical Exam:  NAD Chest CTA-  CV- REG RATE abd- obese, soft Extremities: no edema (cast on right ankle)  Assessment/Plan: 1. Functional deficits secondary to right lateral malleolus fx  Medical Problem List and Plan:  1. Right lateral malleolus fracture after syncopal episode status post ORIF 05/07/2012  2. DVT Prophylaxis/Anticoagulation: Subcutaneous Lovenox.  3. Pain Management: Oxycodone as needed. Monitor with increased mobility  4. Neuropsych: This patient is capable of making decisions on his/her own behalf.  5. Hypertension. Presently on no antihypertensive medications. Patient was on Benicar 0.5 mg daily prior to admission. Will monitor the increased mobility  BP Readings from Last 3 Encounters:  05/13/12 136/81  05/09/12 135/74  05/09/12 135/74  bp is adequately controlled 6. History small cell lung cancer. Patient had been receiving chemotherapy and will followup with Dr. Arbutus Ped  7. Chronic kidney disease. Baseline creatinine 1.4. Followup chemistries   Lab Results  Component Value Date   CREATININE 1.26 05/11/2012     8. Obesity. Weight currently 242 pounds 9. Constipation: augmented bowel regimen  No complaints today  LOS (Days) 4 A FACE TO FACE EVALUATION WAS PERFORMED  [name] 05/13/2012 8:45 AM

## 2012-05-14 ENCOUNTER — Inpatient Hospital Stay (HOSPITAL_COMMUNITY): Payer: Medicare Other | Admitting: Occupational Therapy

## 2012-05-14 ENCOUNTER — Inpatient Hospital Stay (HOSPITAL_COMMUNITY): Payer: Medicare Other | Admitting: Physical Therapy

## 2012-05-14 ENCOUNTER — Encounter (HOSPITAL_COMMUNITY): Payer: Medicare Other | Admitting: Occupational Therapy

## 2012-05-14 DIAGNOSIS — W19XXXA Unspecified fall, initial encounter: Secondary | ICD-10-CM

## 2012-05-14 DIAGNOSIS — S8263XA Displaced fracture of lateral malleolus of unspecified fibula, initial encounter for closed fracture: Secondary | ICD-10-CM

## 2012-05-14 NOTE — Progress Notes (Signed)
[facility] Individual Statement of Services  Patient Name:  [name]  Date:  05/14/2012  Welcome to the [facility].  Our goal is to provide you with an individualized program based on your diagnosis and situation, designed to meet your specific needs.  With this comprehensive rehabilitation program, you will be expected to participate in at least 3 hours of rehabilitation therapies Monday-Friday, with modified therapy programming on the weekends.  Your rehabilitation program will include the following services:  Physical Therapy (PT), Occupational Therapy (OT), 24 hour per day rehabilitation nursing, Case Management (Social Worker), Rehabilitation Medicine, Nutrition Services and Pharmacy Services  Weekly team conferences will be held on Tuesdays to discuss your progress.  Your  Social Worker will talk with you frequently to get your input and to update you on team discussions.  Team conferences with you and your family in attendance may also be held.  Expected length of stay: 7 days  Overall anticipated outcome: modified independent  Depending on your progress and recovery, your program may change.  Your  Social Worker will coordinate services and will keep you informed of any changes.  Your  Social Worker's name and contact numbers are listed  below.  The following services may also be recommended but are not provided by the [facility]patient [facility]:   Driving Evaluations  Home Health Rehabiltiation Services  Outpatient Rehabilitatation [facility]  Vocational Rehabilitation   Arrangements will be made to provide these services after discharge if needed.  Arrangements include referral to agencies that provide these services.  Your insurance has been verified to be:  Medicare and [employer] Your primary doctor is:  Dr. Cyndia Bent  Pertinent information will be shared with your doctor and your insurance company.  Social Worker:   [place], [place] [phone] or ([phone]  Information discussed with and copy given to patient by: Amada Jupiter, 05/14/2012, 11:49 AM

## 2012-05-14 NOTE — Progress Notes (Signed)
Occupational Therapy Session Note  Patient Details  Name: [name] MRN: [id number] Date of Birth: Dec 29, 1940  Today's Date: 05/14/2012 Time: 1300-1330 Time Calculation (min): 30 min  Short Term Goals: Week 1:  OT Short Term Goal 1 (Week 1): Pt will transfer to toilet/ BSC with supervision with min cuing for NWB on right LE OT Short Term Goal 2 (Week 1): Pt will perform toileting supervision sit to stand OT Short Term Goal 3 (Week 1): Pt will perform all sit to stands with supervision with only 1 cue for precautions OT Short Term Goal 4 (Week 1): Pt will endure 20 min of activity before rest break to improve activity tolerance  Skilled Therapeutic Interventions/Progress Updates:    1:1 focus on functional ambulation to bathroom to practice getting into shower stall; crossing threshold with RW, educated on how to wrap right LE for showering; provided written instruction for shower transfer and how to wrap LE  Therapy Documentation Precautions:  Precautions Precautions: Fall Restrictions Weight Bearing Restrictions: Yes RLE Weight Bearing: Non weight bearing Pain: Pain Assessment  Pain Type: Surgical pain Pain Location: Ankle Pain Orientation: Right Pain Descriptors: Aching;Discomfort Pain Intervention(s): Medication (See eMAR)  See FIM for current functional status  Therapy/Group: Individual Therapy  Roney Mans [facility] 05/14/2012, 3:10 PM

## 2012-05-14 NOTE — Progress Notes (Signed)
Social Work Patient ID: [name], male   DOB: [birth year], 71 y.o.   MRN: [id number]  Per therapy team and  MD agreement, pt now scheduled for d/c home tomorrow as he has met modified independent w/c level goals.  Pt aware and agreeable.  Arranging HHPT f/u.  Darrion Wyszynski

## 2012-05-14 NOTE — Progress Notes (Signed)
Social Work  Social Work Assessment and Plan  Patient Details  Name: [name] MRN: [id number] Date of Birth: 1941-04-01  Today's Date: 05/14/2012  Problem List:  Patient Active Problem List  Diagnosis  . Right lower lobe lung mass  . Malignant neoplasm of bronchus and lung, unspecified site  . Small cell lung carcinoma  . Syncope and collapse  . HTN (hypertension)  . CKD (chronic kidney disease) stage 3, GFR 30-59 ml/min  . Thrombocytopenia  . Heart murmur  . Right ankle pain  . Ankle fracture, right  . Acute kidney injury  . Ankle fracture   Past Medical History:  Past Medical History  Diagnosis Date  . Hypertension   . ALLERGIC RHINITIS   . Malignant neoplasm of bronchus and lung, unspecified site 01/23/2012   Past Surgical History:  Past Surgical History  Procedure Date  . Wrist surgery 1961    right wrist  . Orif ankle fracture 05/07/2012    Procedure: OPEN REDUCTION INTERNAL FIXATION (ORIF) ANKLE FRACTURE;  Surgeon: Mable Paris, MD;  Location: [facility];  Service: Orthopedics;  Laterality: Right;   Social History:  reports that he quit smoking 9 days ago. His smoking use included Cigarettes. He has a 12.5 pack-year smoking history. He has never used smokeless tobacco. He reports that he does not drink alcohol or use illicit drugs.  Family / Support Systems Marital Status: Married How Long?: 25 years Patient Roles: Spouse;Parent Spouse/Significant Other: wife, [name] @ [phone] Children: pt reports that he and wife, togther, have 4 adult children but none are living locally Other Supports: neices and nephews Anticipated Caregiver: self Ability/Limitations of Caregiver: Wife has a caretaker, has back problems. Caregiver Availability: Other (Comment) (Has mod I goals, so no caregiver identified.) Family Dynamics: pt describes good support from adult children, but none are local  Social History Preferred language: English Religion:  [employer] Cultural Background: NA Education: college Read: Yes Write: Yes Employment Status: Retired Date Retired/Disabled/Unemployed: age 37 Legal Hisotry/Current Legal Issues: none Guardian/Conservator: none   Abuse/Neglect Physical Abuse: Denies Verbal Abuse: Denies Sexual Abuse: Denies Exploitation of patient/patient's resources: Denies Self-Neglect: Denies  Emotional Status Pt's affect, behavior adn adjustment status: Pt a little sleepy during interview but able to answer questions - occasional mistakes with information (i.e. says he retired in 1959 then corrects this to say at age 71).  Denies any s/s of depression or anxiety.  Will monitor, but pt likely to have short LOS Recent Psychosocial Issues: wife with chronic health issues and requires private duty care within the home. Pyschiatric History: none Substance Abuse History: none  Patient / Family Perceptions, Expectations & Goals Pt/Family understanding of illness & functional limitations: Pt with basic understanding of his injury and of his weight bearing restrictions Premorbid pt/family roles/activities: Pt was relatively independent with a hired support person for his wife. Anticipated changes in roles/activities/participation: pt may require some minimal assistance initially at home Pt/family expectations/goals: "I hope I can start doing more on this foot soon"  [employer]: None Premorbid Home Care/DME Agencies: Other (Comment) [name] Nursing for wife) Transportation available at discharge: yes  Discharge Planning Living Arrangements: Spouse/significant other Support Systems: Spouse/significant other;Children;Home care staff Type of Residence: Private residence Insurance Resources: [employer] Financial Resources: Social Security Financial Screen Referred: No Living Expenses: Own Money Management: Patient Do you have any problems obtaining your medications?: No Home Management: pt and  private duty assistant Patient/Family Preliminary Plans: Pt plans to return home with his wife  and wife continued private duty support which he may hire temporarily for his own assistance needs as well. Social Work Anticipated Follow Up Needs: HH/OP Expected length of stay: 7 days  Clinical Impression Pleasant gentleman a little groggy at beginning of interview.  Has 24/7 hire assistance at home for his wife and may add in some support for himself as well.  Denies any s/s of any significant emotional distress but will monitor.  Anticipate short LOS>  [name] 05/14/2012, 12:07 PM

## 2012-05-14 NOTE — Progress Notes (Signed)
Physical Therapy Note  Patient Details  Name: [name] MRN: [id number] Date of Birth: 16-Jul-1940 Today's Date: 05/14/2012  Time: 1430-1500 30 minutes  No c/o pain.  Treatment focused on pt setting up w/c correctly for safe stand pivot transfer to various furniture.  Pt unable to safely transfer without use of RW from w/c <> furniture but able to safely set up w/c and transfer with mod I with RW.  Ramp training with w/c with pt requiring min A to propel up/down ramp.  Individual therapy   Jadalee Westcott 05/14/2012, 4:14 PM

## 2012-05-14 NOTE — Progress Notes (Signed)
Subjective/Complaints:  no complaints this am. Feeling well. Team notes substantial progress in mobility. Shows good safety awareness.   A 12 point review of systems has been performed and if not noted above is otherwise negative.   Objective: Vital Signs: Blood pressure 144/83, pulse 72, temperature 98.6 F (37 C), temperature source Oral, resp. rate 17, height 6\' 1"  (1.854 m), weight 109.4 kg (241 lb 2.9 oz), SpO2 99.00%. No results found. No results found for this basename: WBC:2,HGB:2,HCT:2,PLT:2 in the last 72 hours No results found for this basename: NA:2,K:2,CL:2,CO:2,GLUCOSE:2,BUN:2,CREATININE:2,CALCIUM:2 in the last 72 hours CBG (last 3)  No results found for this basename: GLUCAP:3 in the last 72 hours  Wt Readings from Last 3 Encounters:  05/10/12 109.4 kg (241 lb 2.9 oz)  05/09/12 110.1 kg (242 lb 11.6 oz)  05/09/12 110.1 kg (242 lb 11.6 oz)    Physical Exam:  Constitutional: He appears well-developed and well-nourished.  HENT:  Head: Normocephalic and atraumatic.  Right Ear: External ear normal.  Left Ear: External ear normal.  Eyes: Conjunctivae normal and EOM are normal. Pupils are equal, round, and reactive to light. Right eye exhibits discharge. Left eye exhibits no discharge.  Pupils round and reactive to light  Neck: Normal range of motion. Neck supple. No JVD present. No tracheal deviation present. No thyromegaly present.  Cardiovascular: Normal rate, regular rhythm and normal heart sounds. Exam reveals no friction rub.  No murmur heard.  Pulmonary/Chest: Effort normal and breath sounds normal. No respiratory distress. He has no wheezes.  Abdominal: Soft. Bowel sounds are normal. He exhibits distension. He exhibits no mass. There is no tenderness. There is no rebound and no guarding.  Obese  Musculoskeletal:  Right LE is appropriately tender, splinted. Lymphadenopathy:  He has no cervical adenopathy.  Neurological: He is alert. He has normal reflexes. No  cranial nerve deficit or sensory deficit.  Patient named person and place. He was able to get his appropriate age followed basic commands. RLE NVI. LLE 3-4/5 prox to 5/5 distally. UE are nearly 5/5  Skin:  Psychiatric: He has a normal mood and affect. His behavior is normal. Judgment and thought content normal        Assessment/Plan: 1. Functional deficits secondary to right lateral malleolus fx which require 3+ hours per day of interdisciplinary therapy in a comprehensive inpatient rehab setting. Physiatrist is providing close team supervision and 24 hour management of active medical problems listed below. Physiatrist and rehab team continue to assess barriers to discharge/monitor patient progress toward functional and medical goals.  Ready for dc tomorrow   FIM: FIM - Bathing Bathing Steps Patient Completed: Chest;Right Arm;Left Arm;Right lower leg (including foot);Abdomen;Front perineal area;Left lower leg (including foot);Buttocks;Right upper leg;Left upper leg Bathing: 5: Set-up assist to: Obtain items  FIM - Upper Body Dressing/Undressing Upper body dressing/undressing steps patient completed: Thread/unthread right sleeve of pullover shirt/dresss;Put head through opening of pull over shirt/dress;Thread/unthread left sleeve of pullover shirt/dress;Pull shirt over trunk Upper body dressing/undressing: 7: Complete Independence: No helper FIM - Lower Body Dressing/Undressing Lower body dressing/undressing steps patient completed: Thread/unthread right underwear leg;Thread/unthread right pants leg;Thread/unthread left underwear leg;Thread/unthread left pants leg;Pull pants up/down;Pull underwear up/down;Don/Doff left sock Lower body dressing/undressing: 6: More than reasonable amount of time  FIM - Toileting Toileting steps completed by patient: Adjust clothing prior to toileting;Performs perineal hygiene;Adjust clothing after toileting Toileting Assistive Devices: Grab bar or rail  for support Toileting: 4: Steadying assist  FIM - Diplomatic Services operational officer Devices: Best boy  Transfers: 4-To toilet/BSC: Min A (steadying Pt. > 75%);4-From toilet/BSC: Min A (steadying Pt. > 75%)  FIM - Bed/Chair Transfer Bed/Chair Transfer Assistive Devices: Arm rests;Bed rails Bed/Chair Transfer: 5: Bed > Chair or W/C: Supervision (verbal cues/safety issues);5: Chair or W/C > Bed: Supervision (verbal cues/safety issues);6: Supine > Sit: No assist;6: Sit > Supine: No assist  FIM - Locomotion: Wheelchair Distance: 150 Locomotion: Wheelchair: 6: Travels 150 ft or more, turns around, maneuvers to table, bed or toilet, negotiates 3% grade: maneuvers on rugs and over door sills independently FIM - Locomotion: Ambulation Locomotion: Ambulation Assistive Devices: Designer, industrial/product Ambulation/Gait Assistance: 4: Min assist Locomotion: Ambulation: 1: Travels less than 50 ft with supervision/safety issues  Comprehension Comprehension Mode: Auditory Comprehension: 7-Follows complex conversation/direction: With no assist  Expression Expression Mode: Verbal Expression: 7-Expresses complex ideas: With no assist  Social Interaction Social Interaction: 7-Interacts appropriately with others - No medications needed.  Problem Solving Problem Solving: 7-Solves complex problems: Recognizes & self-corrects  Memory Memory: 7-Complete Independence: No helper  Medical Problem List and Plan:  1. Right lateral malleolus fracture after syncopal episode status post ORIF 05/07/2012  2. DVT Prophylaxis/Anticoagulation: Subcutaneous Lovenox. Monitor platelet counts any signs of bleeding  3. Pain Management: Oxycodone as needed. Monitor with increased mobility  4. Neuropsych: This patient is capable of making decisions on his/her own behalf.  5. Hypertension. Presently on no antihypertensive medications. Patient was on Benicar  daily prior to admission.  Might be able to  resume on discharge. 6. History small cell lung cancer. Patient had been receiving chemotherapy and will followup with Dr. Arbutus Ped  7. Chronic kidney disease. Baseline creatinine 1.4. Followup chemistries  -mild increase in BUN- pushing fluids      -sodium now back WNL  8. Obesity. Weight currently 242 pounds 9. Constipation: augmented bowel regimen    LOS (Days) 5 A FACE TO FACE EVALUATION WAS PERFORMED  SWARTZ,ZACHARY T 05/14/2012 9:52 PM

## 2012-05-14 NOTE — Progress Notes (Signed)
Physical Therapy Note  Patient Details  Name: [name] MRN: [id number] Date of Birth: March 01, 1941 Today's Date: 05/14/2012  Time: 1030-1125 55 minutes  [name], repositioned and rest as needed.  W/c mobility controlled environment with mod I, household environment with supervision, cuing for parts management and safety.  Short distance gait multiple attempts in controlled and household environment with supervision.  Car transfer with supervision to simulated sedan height car, bed mobility with mod I, SPT with RW with supervision.  Nu step for UE strength and endurance level 4 x 6 minutes.  Furniture transfers with supervision/cuing for safety and w/c set up for safe transfer without RW.  Pt able to perform safe w/c set up and transfers with RW without cuing.  Pt requires frequent rests but is improving safety with w/c management.  Individual therapy   Teliyah Royal 05/14/2012, 11:24 AM

## 2012-05-14 NOTE — Progress Notes (Signed)
Physical Therapy Note  Patient Details  Name: [name] MRN: [id number] Date of Birth: February 03, 1941 Today's Date: 05/14/2012  7829-5621 (25 minutes) individual Pain: 4/10 RT ankle/premedicated Focus of treatment: gait training/endurance NWB RT LE Treatment: wc mobility - mod independent unit 150 feet level, controlled environment; Gait 50 feet RW SBA with 2 short standing rest breaks (appears to be limit of distance before fatigue); up.down curb RW backward (pt unable to perform forwards and maintain NWB RT LE) SBA; wc pushups and walker pushups in standing for UE strengthening X 10.  Pt states he thinks he has a walker and wc ( ? Elevating leg rest ) at home.   Bayley Hurn,JIM 05/14/2012, 7:22 AM

## 2012-05-14 NOTE — Discharge Summary (Signed)
  Discharge summary job [phone]

## 2012-05-14 NOTE — Progress Notes (Signed)
Occupational Therapy Session Note  Patient Details  Name: [name] MRN: [id number] Date of Birth: 10-Apr-1941  Today's Date: 05/14/2012 Time: 0815-0900 Time Calculation (min): 45 min  Short Term Goals: Week 1:  OT Short Term Goal 1 (Week 1): Pt will transfer to toilet/ BSC with supervision with min cuing for NWB on right LE OT Short Term Goal 2 (Week 1): Pt will perform toileting supervision sit to stand OT Short Term Goal 3 (Week 1): Pt will perform all sit to stands with supervision with only 1 cue for precautions OT Short Term Goal 4 (Week 1): Pt will endure 20 min of activity before rest break to improve activity tolerance  Skilled Therapeutic Interventions/Progress Updates:    1:1 self care retraining and d/c planning: pt chose to bathe and dress at sink level, focus on functional ambulation around room with RW with increased ability to maintain weightbearing precautions, sit to stands, activity tolerance, safety with RW around room and with turns without hand on assistance, practice with donn and doffing leg rest of w/c. Pt reports the person who take him to chemo would be the person who would pick him up from here on day of d/c. Made plan to write out steps for shower stall transfer and how to cover leg.  Therapy Documentation Precautions:  Precautions Precautions: Fall Restrictions Weight Bearing Restrictions: Yes RLE Weight Bearing: Non weight bearing Pain: No c/o See FIM for current functional status  Therapy/Group: Individual Therapy  Roney Mans [facility] 05/14/2012, 11:09 AM

## 2012-05-15 ENCOUNTER — Inpatient Hospital Stay (HOSPITAL_COMMUNITY): Payer: Medicare Other | Admitting: Physical Therapy

## 2012-05-15 ENCOUNTER — Ambulatory Visit: Payer: Medicare Other | Admitting: Physician Assistant

## 2012-05-15 ENCOUNTER — Encounter (HOSPITAL_COMMUNITY): Payer: Medicare Other | Admitting: Occupational Therapy

## 2012-05-15 ENCOUNTER — Other Ambulatory Visit: Payer: Medicare Other

## 2012-05-15 ENCOUNTER — Ambulatory Visit: Payer: Medicare Other

## 2012-05-15 DIAGNOSIS — W19XXXA Unspecified fall, initial encounter: Secondary | ICD-10-CM

## 2012-05-15 DIAGNOSIS — S8263XA Displaced fracture of lateral malleolus of unspecified fibula, initial encounter for closed fracture: Secondary | ICD-10-CM

## 2012-05-15 MED ORDER — FOLIC ACID 1 MG PO TABS: 1.0000 mg | ORAL_TABLET | Freq: Every day | ORAL | Status: DC

## 2012-05-15 MED ORDER — ASPIRIN EC 325 MG PO TBEC: 325.0000 mg | DELAYED_RELEASE_TABLET | Freq: Two times a day (BID) | ORAL | Status: DC

## 2012-05-15 MED ORDER — ASPIRIN 325 MG PO TABS
325.0000 mg | ORAL_TABLET | Freq: Every day | ORAL | Status: DC
  Administered 2012-05-15: 325 mg via ORAL
  Filled 2012-05-15 (×2): qty 1

## 2012-05-15 MED ORDER — OXYCODONE HCL 5 MG PO TABS: 5.0000 mg | ORAL_TABLET | Freq: Four times a day (QID) | ORAL | Status: DC | PRN

## 2012-05-15 NOTE — Progress Notes (Signed)
Physical Therapy Note  Patient Details  Name: [name] MRN: [id number] Date of Birth: 29-Oct-1940 Today's Date: 05/15/2012  Time: 1100-1154 54 minutes  Pt c/o 6/10 pain in R LE, RN made aware.  Gait training controlled and household environment with supervision with RW.  Car transfers mod I with RW.  W/c mobility >500' with mod I, including parts management and set up for transfers.  Bed mobility mod I.  Nu step for UE/LE endurance x 7 minutes level 4.  Stair training x 1 stair with min A B handrails.  Standing balance with 1 UE support with mod I for reaching task.  Pt reports he feels prepared for discharge and states he owns RW and w/c at home.  Individual therapy   DONAWERTH,KAREN 05/15/2012, 12:25 PM

## 2012-05-15 NOTE — Progress Notes (Signed)
Occupational Therapy Discharge Summary  Patient Details  Name: [name] MRN: [id number] Date of Birth: 1940-07-10  Today's Date: 05/15/2012 Time: 1015-1100 Time Calculation (min): 45 min  GRAD DAY! 1:1 self care retraining at shower level; focus on sit to stand, standing balance, functional ambulation with RW around room, activity tolerance, education again on covering right LE for showering and on basic home recommendations  Patient has met 9 of 9 long term goals due to improved activity tolerance, improved balance, postural control, ability to compensate for deficits and functional use of  RIGHT upper and LEFT upper extremity.  Patient to discharge at overall Modified Independent level.  Patient's care partner were provided the necessary handouts for how to provide assistance at discharge prn.    Reasons goals not met: n/a  Recommendation:  Patient will benefit from ongoing skilled OT services in home health setting to continue to advance functional skills in the area of BADL.  Equipment: No equipment provided  Reasons for discharge: treatment goals met and discharge from hospital  Patient/family agrees with progress made and goals achieved: Yes  OT Discharge Precautions/Restrictions  Restrictions Weight Bearing Restrictions: Yes RLE Weight Bearing: Non weight bearing Pain Pain Assessment Pain Score:   5 Pain Type: Surgical pain Pain Location: Ankle Pain Orientation: Right Pain Intervention(s): Medication (See eMAR) ADL  see FIM Vision/Perception  Vision - History Baseline Vision: No visual deficits Patient Visual Report: No change from baseline Vision - Assessment Eye Alignment: Within Functional Limits Perception Perception: Within Functional Limits Praxis Praxis: Intact  Cognition Overall Cognitive Status: Appears within functional limits for tasks assessed Arousal/Alertness: Awake/alert Orientation Level: Oriented X4 Memory: Impaired Memory  Impairment: Decreased recall of new information;Decreased short term memory Decreased Short Term Memory: Verbal complex;Functional complex Sensation Sensation Light Touch: Appears Intact Stereognosis: Appears Intact Hot/Cold: Appears Intact Proprioception: Appears Intact Coordination Gross Motor Movements are Fluid and Coordinated: Yes Fine Motor Movements are Fluid and Coordinated: Yes Motor  Motor Motor: Within Functional Limits Mobility  Bed Mobility Sit to Supine: 6: Modified independent (Device/Increase time) Transfers Sit to Stand: 6: Modified independent (Device/Increase time) Stand to Sit: 6: Modified independent (Device/Increase time)  Trunk/Postural Assessment  Cervical Assessment Cervical Assessment: Within Functional Limits Thoracic Assessment Thoracic Assessment: Within Functional Limits Lumbar Assessment Lumbar Assessment: Within Functional Limits Postural Control Postural Control: Within Functional Limits  Balance Static Sitting Balance Static Sitting - Level of Assistance: 7: Independent Dynamic Sitting Balance Dynamic Sitting - Level of Assistance: 7: Independent Static Standing Balance Static Standing - Balance Support: During functional activity Static Standing - Level of Assistance: 6: Modified independent (Device/Increase time) Extremity/Trunk Assessment RUE Assessment RUE Assessment: Within Functional Limits LUE Assessment LUE Assessment: Within Functional Limits  See FIM for current functional status  Roney Mans [facility] 05/15/2012, 12:06 PM

## 2012-05-15 NOTE — Progress Notes (Signed)
Physical Therapy Discharge Summary  Patient Details  Name: [name] MRN: [id number] Date of Birth: Apr 01, 1941  Today's Date: 05/15/2012  Patient has met 7 of 7 long term goals due to improved activity tolerance, improved balance, improved postural control and ability to compensate for deficits.  Patient to discharge at an ambulatory level Supervision, mod I w/c level and mod I for transfers with RW.     Reasons goals not met: n/a  Recommendation:  Patient will benefit from ongoing skilled PT services in home health setting to continue to advance safe functional mobility, address ongoing impairments in strength, balance, gait, activity tolerance, and minimize fall risk.  Equipment: pt reports he has w/c and RW at home  Reasons for discharge: treatment goals met and discharge from hospital  Patient/family agrees with progress made and goals achieved: Yes  PT Discharge  Cognition Overall Cognitive Status: Appears within functional limits for tasks assessed Sensation Sensation Light Touch: Appears Intact Proprioception: Appears Intact Coordination Gross Motor Movements are Fluid and Coordinated: Yes Motor  Motor Motor: Within Functional Limits   Trunk/Postural Assessment  Cervical Assessment Cervical Assessment: Within Functional Limits Thoracic Assessment Thoracic Assessment: Within Functional Limits Lumbar Assessment Lumbar Assessment: Within Functional Limits Postural Control Postural Control: Within Functional Limits  Balance Static Sitting Balance Static Sitting - Level of Assistance: 7: Independent Dynamic Sitting Balance Dynamic Sitting - Level of Assistance: 7: Independent Static Standing Balance Static Standing - Balance Support: During functional activity Static Standing - Level of Assistance: 6: Modified independent (Device/Increase time) Extremity Assessment      RLE Assessment RLE Assessment: Within Functional Limits LLE Assessment LLE  Assessment: Within Functional Limits  See FIM for current functional status  [name] 05/15/2012, 3:54 PM

## 2012-05-15 NOTE — Patient Care Conference (Signed)
Inpatient RehabilitationTeam Conference Note Date: 05/15/2012   Time: 2:55 PM    Patient Name: [name]      Medical Record Number: [id number]  Date of Birth: [birth year] Sex: Male         Room/Bed: 4147/4147-01 Payor Info: Payor: MEDICARE  Plan: MEDICARE PART A AND B  Product Type: *No Product type*     Admitting Diagnosis: Ankle Fx  Admit Date/Time:  05/09/2012  3:47 PM Admission Comments: No comment available   Primary Diagnosis:  Ankle fracture Principal Problem: Ankle fracture  Patient Active Problem List   Diagnosis Date Noted  . Ankle fracture 05/10/2012  . Acute kidney injury 05/09/2012  . Ankle fracture, right 05/06/2012  . Syncope and collapse 05/05/2012  . HTN (hypertension) 05/05/2012  . CKD (chronic kidney disease) stage 3, GFR 30-59 ml/min 05/05/2012  . Thrombocytopenia 05/05/2012  . Heart murmur 05/05/2012  . Right ankle pain 05/05/2012  . Malignant neoplasm of bronchus and lung, unspecified site 01/23/2012  . Small cell lung carcinoma 01/23/2012  . Right lower lobe lung mass 01/16/2012    Expected Discharge Date: Expected Discharge Date: 05/15/12  Team Members Present: Physician: Dr. Faith Rogue Social Worker Present: Amada Jupiter, LCSW Nurse Present: Other (comment) Kennon Portela, RN) PT Present: Karolee Stamps, PT OT Present: Edwin Cap, Loistine Chance, OT Other (Discipline and Name): Tora Duck, PPS Coordinator     Current Status/Progress Goal Weekly Team Focus  Medical   right lateral malleolus fx  finalize planning for dc  pain mgt,    Bowel/Bladder   pt continent of bowel and bladder         Swallow/Nutrition/ Hydration             ADL's   mod I overall  mod I overall  d/c planning   Mobility   mod I w/c, supervision gait  mod I w/c, supervision gait  d/c planning   Communication             Safety/Cognition/ Behavioral Observations            Pain   oxycodone 5mg  q6hrs PRN, Pain to right ankle  3 or less  assess and  monitor pain q shift and medicate as needed   Skin   right LLE Soft cast inplace  no new skin breakdown  assess skin qshift       *See Interdisciplinary Assessment and Plan and progress notes for long and short-term goals  Barriers to Discharge: none    Possible Resolutions to Barriers:  none    Discharge Planning/Teaching Needs:         Team Discussion:  Very good gains and ready for d/c  Revisions to Treatment Plan:  none   Continued Need for Acute Rehabilitation Level of Care: The patient requires daily medical management by a physician with specialized training in physical medicine and rehabilitation for the following conditions: Daily direction of a multidisciplinary physical rehabilitation program to ensure safe treatment while eliciting the highest outcome that is of practical value to the patient.: Yes Daily medical management of patient stability for increased activity during participation in an intensive rehabilitation regime.: Yes Daily analysis of laboratory values and/or radiology reports with any subsequent need for medication adjustment of medical intervention for : Post surgical problems;Other  [name] 05/15/2012, 4:57 PM

## 2012-05-15 NOTE — Progress Notes (Signed)
Pt discharged from unit at 1340. Pt with family friend and all belongings are with pt. Discharge instructions given by Deatra Ina, PA with all questions answered at this time.

## 2012-05-15 NOTE — Discharge Summary (Signed)
NAME:  [name], [name] NO.:  [id number]  MEDICAL RECORD NO.:  [id number]  LOCATION:  4147                         FACILITY:  [facility]  PHYSICIAN:  Ranelle Oyster, M.D.DATE OF BIRTH:  07/26/1940  DATE OF ADMISSION:  05/09/2012 DATE OF DISCHARGE:  05/15/2012                              DISCHARGE SUMMARY   DISCHARGE DIAGNOSES: 1. Right lateral malleolus fracture after syncopal episode, status     post open reduction and internal fixation on May 07, 2012. 2. Subcutaneous Lovenox for deep venous thrombosis prophylaxis, pain     management. 3. Hypertension. 4. History of small cell lung cancer. 5. Chronic kidney disease, baseline creatinine of 1.4. 6. Obesity.  HISTORY:  This is a 71 year old right-handed male with history of hypertension, small cell lung cancer, actively receiving chemotherapy. He was admitted on May 05, 2012, secondary to episode of syncope. By report, the patient had received last chemo treatment on May 01, 2012.  He had not been eating or drinking much.  Syncopal episode was witnessed by [facility], also by his wife who called 911.  He did not strike his head during the fall per report.  He did have complaints of right ankle pain.  X-rays demonstrated mild displaced fibula fracture - bimalleolar ankle fracture.  Orthopedic Service was consulted, underwent ORIF of right lateral malleolus fracture on May 07, 2012, per Dr. Ave Filter.  Advised nonweightbearing right lower extremity. Placed on subcutaneous Lovenox for DVT prophylaxis.  Postoperative pain management.  Hospital course, echocardiogram completed during workup of syncope that showed ejection fraction of 65%, grade 1 diastolic dysfunction.  Cardiac enzymes and troponin unremarkable.  Syncopal event felt to be multifactorial related to recent chemotherapy.  Physical and occupational therapy ongoing, the patient was admitted for comprehensive rehab  program.  PAST MEDICAL HISTORY:  See discharge diagnoses.  SOCIAL HISTORY:  Lives with spouse, 1-level home.  FUNCTIONAL HISTORY PRIOR TO ADMISSION:  Independent in driving.  FUNCTIONAL STATUS UPON ADMISSION TO REHAB SERVICES:  +2 total-assist for stand pivot transfers, +2 total-assist for stand to sit.  PHYSICAL EXAMINATION:  VITAL SIGNS:  Blood pressure 139/78, pulse 83, temperature 98, respirations 18. GENERAL:  This was an alert male, in no acute distress, oriented x3. His mood was appropriate. LUNGS:  Clear to auscultation. CARDIAC:  Regular rate and rhythm. ABDOMEN:  Positive bowel sounds.  No masses, nontender, without guarding.  REHABILITATION HOSPITAL COURSE:  The patient was admitted to Inpatient Rehab Services with therapies initiated on a 3-hour daily basis consisting of physical therapy, occupational therapy, and 24-hour rehabilitation nursing.  The following issues were addressed during the patient's rehabilitation stay.  Pertaining to [name]' right lateral malleolus fracture, remained stable after ORIF on May 07, 2012, per Dr. Ave Filter.  Neurovascular sensation intact.  He was nonweightbearing. He remained on subcutaneous Lovenox for DVT prophylaxis throughout his hospital course.  He would be discharged on aspirin therapy 325 mg daily at the time of discharge.  Pain management with the use of oxycodone in good results.  He did have a history of hypertension.  He was on no present antihypertensive medications.  He was on Benicar prior to hospital admission, this would  be resumed accordingly.  He would follow up with [facility]cology Services, Dr. Arbutus Ped in relation to his history of small cell lung cancer with ongoing care at their discretion.  He did have a history of chronic kidney disease, baseline creatinine of 1.4, latest creatinine of 1.26 noted.  The patient received weekly collaborative interdisciplinary team conferences to discuss estimated length of  stay, family teaching, and any barriers to his discharge.  He was modified independent for wheelchair mobility as well as household environment with supervision, needing some cuing for parks management, safety.  Car transfers with supervision to a simulated sedan height car. Bed mobility with modified independent.  Ambulating with a rolling walker and supervision.  He was minimal-assist for lower body dressing, independent standby-assist for upper body.  Full family teaching was completed and plan will be discharged to home with ongoing therapies dictated as per [employer].  DISCHARGE MEDICATIONS:  Included: 1. Oxycodone immediate release 5 mg every 6 hours as needed pain. 2. Ecotrin 325 mg p.o. daily. 3. Folic acid 1 mg daily. 4. Protonix 40 mg daily. 5. Senokot-S tablets, 2 at bedtime. 6. Vitamin B12 1000 mcg daily.  DIET:  Regular.  SPECIAL INSTRUCTIONS:  Nonweightbearing right lower extremity.  Follow up with Dr. Arbutus Ped of [facility] in relation of small cell lung cancer.  The patient should follow up with Dr. Jones Broom in 2 weeks, Orthopedic Services in regards to the malleolus ankle fracture. Follow up with Dr. Faith Rogue at the Outpatient Rehab Service Office as needed, Dr. Antony Haste, medical management.     Mariam Dollar, P.A.   ______________________________ Ranelle Oyster, M.D.    DA/MEDQ  D:  05/14/2012  T:  05/15/2012  Job:  [id number]  cc:   Jones Broom, MD Lajuana Matte, M.D. Jamey Reas, MD

## 2012-05-15 NOTE — Progress Notes (Signed)
Social Work Discharge Note Discharge Note  The overall goal for the admission was met for:   Discharge location: Yes - home with wife and private duty caregiver 24/7   Length of Stay: Yes - 6 days  Discharge activity level: Yes - modified independent  Home/community participation: Yes  Services provided included: MD, RD, PT, OT, RN, Pharmacy and SW  Financial Services: Medicare  Follow-up services arranged: Home Health: PT via [facility] and Patient/Family has no preference for HH/DME agencies  Comments (or additional information):  Patient/Family verbalized understanding of follow-up arrangements: Yes  Individual responsible for coordination of the follow-up plan: patient  Confirmed correct DME delivered: NA  [name]

## 2012-05-15 NOTE — Progress Notes (Signed)
Subjective/Complaints: Ready to go home. Feels prepared. A 12 point review of systems has been performed and if not noted above is otherwise negative.   Objective: Vital Signs: Blood pressure 140/79, pulse 86, temperature 98.5 F (36.9 C), temperature source Oral, resp. rate 18, height 6\' 1"  (1.854 m), weight 109.4 kg (241 lb 2.9 oz), SpO2 98.00%. No results found. No results found for this basename: WBC:2,HGB:2,HCT:2,PLT:2 in the last 72 hours No results found for this basename: NA:2,K:2,CL:2,CO:2,GLUCOSE:2,BUN:2,CREATININE:2,CALCIUM:2 in the last 72 hours CBG (last 3)  No results found for this basename: GLUCAP:3 in the last 72 hours  Wt Readings from Last 3 Encounters:  05/10/12 109.4 kg (241 lb 2.9 oz)  05/09/12 110.1 kg (242 lb 11.6 oz)  05/09/12 110.1 kg (242 lb 11.6 oz)    Physical Exam:  Constitutional: He appears well-developed and well-nourished.  HENT:  Head: Normocephalic and atraumatic.  Right Ear: External ear normal.  Left Ear: External ear normal.  Eyes: Conjunctivae normal and EOM are normal. Pupils are equal, round, and reactive to light. Right eye exhibits discharge. Left eye exhibits no discharge.  Pupils round and reactive to light  Neck: Normal range of motion. Neck supple. No JVD present. No tracheal deviation present. No thyromegaly present.  Cardiovascular: Normal rate, regular rhythm and normal heart sounds. Exam reveals no friction rub.  No murmur heard.  Pulmonary/Chest: Effort normal and breath sounds normal. No respiratory distress. He has no wheezes.  Abdominal: Soft. Bowel sounds are normal. He exhibits distension. He exhibits no mass. There is no tenderness. There is no rebound and no guarding.  Obese  Musculoskeletal:  Right LE is appropriately tender, splinted. Lymphadenopathy:  He has no cervical adenopathy.  Neurological: He is alert. He has normal reflexes. No cranial nerve deficit or sensory deficit.  Patient named person and place. He  was able to get his appropriate age followed basic commands. RLE NVI. LLE 3-4/5 prox to 5/5 distally. UE are nearly 5/5. Good sitting and standing balance Skin:  Psychiatric: He has a normal mood and affect. His behavior is normal. Judgment and thought content normal        Assessment/Plan: 1. Functional deficits secondary to right lateral malleolus fx which require 3+ hours per day of interdisciplinary therapy in a comprehensive inpatient rehab setting. Physiatrist is providing close team supervision and 24 hour management of active medical problems listed below. Physiatrist and rehab team continue to assess barriers to discharge/monitor patient progress toward functional and medical goals.  Ready for dc tomorrow   FIM: FIM - Bathing Bathing Steps Patient Completed: Chest;Right Arm;Left Arm;Right lower leg (including foot);Abdomen;Front perineal area;Left lower leg (including foot);Buttocks;Right upper leg;Left upper leg Bathing: 5: Set-up assist to: Obtain items  FIM - Upper Body Dressing/Undressing Upper body dressing/undressing steps patient completed: Thread/unthread right sleeve of pullover shirt/dresss;Put head through opening of pull over shirt/dress;Thread/unthread left sleeve of pullover shirt/dress;Pull shirt over trunk Upper body dressing/undressing: 7: Complete Independence: No helper FIM - Lower Body Dressing/Undressing Lower body dressing/undressing steps patient completed: Thread/unthread right underwear leg;Thread/unthread right pants leg;Thread/unthread left underwear leg;Thread/unthread left pants leg;Pull pants up/down;Pull underwear up/down;Don/Doff left sock Lower body dressing/undressing: 6: More than reasonable amount of time  FIM - Toileting Toileting steps completed by patient: Adjust clothing prior to toileting;Performs perineal hygiene;Adjust clothing after toileting Toileting Assistive Devices: Grab bar or rail for support Toileting: 4: Steadying  assist  FIM - Diplomatic Services operational officer Devices: Best boy Transfers: 4-To toilet/BSC: Min A (steadying Pt. > 75%);4-From  toilet/BSC: Min A (steadying Pt. > 75%)  FIM - Bed/Chair Transfer Bed/Chair Transfer Assistive Devices: Arm rests;Bed rails Bed/Chair Transfer: 5: Bed > Chair or W/C: Supervision (verbal cues/safety issues);5: Chair or W/C > Bed: Supervision (verbal cues/safety issues);6: Supine > Sit: No assist;6: Sit > Supine: No assist  FIM - Locomotion: Wheelchair Distance: 150 Locomotion: Wheelchair: 6: Travels 150 ft or more, turns around, maneuvers to table, bed or toilet, negotiates 3% grade: maneuvers on rugs and over door sills independently FIM - Locomotion: Ambulation Locomotion: Ambulation Assistive Devices: Designer, industrial/product Ambulation/Gait Assistance: 4: Min assist Locomotion: Ambulation: 1: Travels less than 50 ft with supervision/safety issues  Comprehension Comprehension Mode: Auditory Comprehension: 7-Follows complex conversation/direction: With no assist  Expression Expression Mode: Verbal Expression: 7-Expresses complex ideas: With no assist  Social Interaction Social Interaction: 7-Interacts appropriately with others - No medications needed.  Problem Solving Problem Solving: 7-Solves complex problems: Recognizes & self-corrects  Memory Memory: 7-Complete Independence: No helper  Medical Problem List and Plan:  1. Right lateral malleolus fracture after syncopal episode status post ORIF 05/07/2012  2. DVT Prophylaxis/Anticoagulation: Subcutaneous Lovenox. Monitor platelet counts any signs of bleeding  3. Pain Management: Oxycodone as needed. Monitor with increased mobility  4. Neuropsych: This patient is capable of making decisions on his/her own behalf.  5. Hypertension. Presently on no antihypertensive medications. Patient was on Benicar  daily prior to admission.  Might be able to resume on discharge. 6. History small  cell lung cancer. Patient had been receiving chemotherapy and will followup with Dr. Arbutus Ped  7. Chronic kidney disease. Baseline creatinine 1.4.   -mild increase in BUN- pushing fluids      -sodium now back WNL  8. Obesity. Weight currently 242 pounds 9. Constipation: augmented bowel regimen    LOS (Days) 6 A FACE TO FACE EVALUATION WAS PERFORMED  SWARTZ,ZACHARY T 05/15/2012 7:58 AM

## 2012-05-22 ENCOUNTER — Encounter: Payer: Self-pay | Admitting: Physician Assistant

## 2012-05-22 ENCOUNTER — Other Ambulatory Visit (HOSPITAL_BASED_OUTPATIENT_CLINIC_OR_DEPARTMENT_OTHER): Payer: Medicare Other

## 2012-05-22 ENCOUNTER — Ambulatory Visit (HOSPITAL_BASED_OUTPATIENT_CLINIC_OR_DEPARTMENT_OTHER): Payer: Medicare Other | Admitting: Physician Assistant

## 2012-05-22 ENCOUNTER — Ambulatory Visit (HOSPITAL_BASED_OUTPATIENT_CLINIC_OR_DEPARTMENT_OTHER): Payer: Medicare Other

## 2012-05-22 ENCOUNTER — Other Ambulatory Visit: Payer: Self-pay | Admitting: Internal Medicine

## 2012-05-22 ENCOUNTER — Telehealth: Payer: Self-pay | Admitting: Internal Medicine

## 2012-05-22 VITALS — BP 122/76 | HR 90 | Temp 98.7°F

## 2012-05-22 DIAGNOSIS — Z5111 Encounter for antineoplastic chemotherapy: Secondary | ICD-10-CM

## 2012-05-22 DIAGNOSIS — C3430 Malignant neoplasm of lower lobe, unspecified bronchus or lung: Secondary | ICD-10-CM

## 2012-05-22 DIAGNOSIS — C3490 Malignant neoplasm of unspecified part of unspecified bronchus or lung: Secondary | ICD-10-CM

## 2012-05-22 DIAGNOSIS — C3410 Malignant neoplasm of upper lobe, unspecified bronchus or lung: Secondary | ICD-10-CM

## 2012-05-22 LAB — COMPREHENSIVE METABOLIC PANEL (CC13)
ALT: 15 U/L (ref 0–55)
AST: 17 U/L (ref 5–34)
Albumin: 3.1 g/dL — ABNORMAL LOW (ref 3.5–5.0)
Alkaline Phosphatase: 91 U/L (ref 40–150)
BUN: 19 mg/dL (ref 7.0–26.0)
Calcium: 10.1 mg/dL (ref 8.4–10.4)
Chloride: 103 mEq/L (ref 98–107)
Potassium: 4 mEq/L (ref 3.5–5.1)
Sodium: 139 mEq/L (ref 136–145)
Total Protein: 6.6 g/dL (ref 6.4–8.3)

## 2012-05-22 LAB — CBC WITH DIFFERENTIAL/PLATELET
BASO%: 0.3 % (ref 0.0–2.0)
EOS%: 2 % (ref 0.0–7.0)
MCH: 35.5 pg — ABNORMAL HIGH (ref 27.2–33.4)
MCHC: 34.9 g/dL (ref 32.0–36.0)
MONO#: 0.4 10*3/uL (ref 0.1–0.9)
RDW: 13.6 % (ref 11.0–14.6)
WBC: 3 10*3/uL — ABNORMAL LOW (ref 4.0–10.3)
lymph#: 1 10*3/uL (ref 0.9–3.3)
nRBC: 0 % (ref 0–0)

## 2012-05-22 MED ORDER — POTASSIUM CHLORIDE 2 MEQ/ML IV SOLN
Freq: Once | INTRAVENOUS | Status: AC
  Administered 2012-05-22: 11:00:00 via INTRAVENOUS
  Filled 2012-05-22: qty 10

## 2012-05-22 MED ORDER — SODIUM CHLORIDE 0.9 % IV SOLN
150.0000 mg | Freq: Once | INTRAVENOUS | Status: AC
  Administered 2012-05-22: 150 mg via INTRAVENOUS
  Filled 2012-05-22: qty 5

## 2012-05-22 MED ORDER — PALONOSETRON HCL INJECTION 0.25 MG/5ML
0.2500 mg | Freq: Once | INTRAVENOUS | Status: AC
  Administered 2012-05-22: 0.25 mg via INTRAVENOUS

## 2012-05-22 MED ORDER — SODIUM CHLORIDE 0.9 % IV SOLN
500.0000 mL | Freq: Once | INTRAVENOUS | Status: AC
  Administered 2012-05-22: 500 mL via INTRAVENOUS

## 2012-05-22 MED ORDER — SODIUM CHLORIDE 0.9 % IV SOLN
30.0000 mg/m2 | Freq: Once | INTRAVENOUS | Status: AC
  Administered 2012-05-22: 73 mg via INTRAVENOUS
  Filled 2012-05-22: qty 73

## 2012-05-22 MED ORDER — SODIUM CHLORIDE 0.9 % IV SOLN
Freq: Once | INTRAVENOUS | Status: AC
  Administered 2012-05-22: 10:00:00 via INTRAVENOUS

## 2012-05-22 MED ORDER — DEXAMETHASONE SODIUM PHOSPHATE 4 MG/ML IJ SOLN
12.0000 mg | Freq: Once | INTRAMUSCULAR | Status: AC
  Administered 2012-05-22: 12 mg via INTRAVENOUS

## 2012-05-22 MED ORDER — IRINOTECAN HCL CHEMO INJECTION 100 MG/5ML
65.0000 mg/m2 | Freq: Once | INTRAVENOUS | Status: AC
  Administered 2012-05-22: 158 mg via INTRAVENOUS
  Filled 2012-05-22: qty 7.9

## 2012-05-22 NOTE — Patient Instructions (Addendum)
Follow up with Dr. Arbutus Ped i 3 weeks with a restaging Ct scan of your chest, abdomen and pelvis to re-evaluate your disease

## 2012-05-22 NOTE — Telephone Encounter (Signed)
appt made and printed for pt    [name]

## 2012-05-22 NOTE — Progress Notes (Signed)
Per Vincent Peyer, RN/Adrena Laural Benes, [place], [place] will come see pt in infusion today-dhp, rn 1405- Tiana Loft, PA chairside to assess patient-dhp, rn

## 2012-05-22 NOTE — Patient Instructions (Addendum)
[facility] Discharge Instructions for Patients Receiving Chemotherapy  Today you received the following chemotherapy agents Irinotecan and Cisplatin  To help prevent nausea and vomiting after your treatment, we encourage you to take your nausea medication as prescribed.   If you develop nausea and vomiting that is not controlled by your nausea medication, call the clinic. If it is after clinic hours your family physician or the after hours number for the clinic or go to the Emergency Department.   BELOW ARE SYMPTOMS THAT SHOULD BE REPORTED IMMEDIATELY:  *FEVER GREATER THAN 100.5 F  *CHILLS WITH OR WITHOUT FEVER  NAUSEA AND VOMITING THAT IS NOT CONTROLLED WITH YOUR NAUSEA MEDICATION  *UNUSUAL SHORTNESS OF BREATH  *UNUSUAL BRUISING OR BLEEDING  TENDERNESS IN MOUTH AND THROAT WITH OR WITHOUT PRESENCE OF ULCERS  *URINARY PROBLEMS  *BOWEL PROBLEMS  UNUSUAL RASH Items with * indicate a potential emergency and should be followed up as soon as possible.   Feel free to call the clinic you have any questions or concerns. The clinic phone number is [phone].   I have been informed and understand all the instructions given to me. I know to contact the clinic, my physician, or go to the Emergency Department if any problems should occur. I do not have any questions at this time, but understand that I may call the clinic during office hours   should I have any questions or need assistance in obtaining follow up care.

## 2012-05-25 NOTE — Progress Notes (Signed)
[facility] Telephone:([phone]) [phone]   Fax:([phone]  OFFICE PROGRESS NOTE  [name], MD [phone] [address] [place] [address]  DIAGNOSIS: Extensive stage small cell lung cancer diagnosed in August of 2013.  PRIOR THERAPY: Systemic chemotherapy in the form of carboplatin for an AUC of 5 given on day 1 and etoposide 100 mg per meter squared given on days 1, 2 and 3 with Neulasta support given on day 4. Status post 2 cycle, discontinued today secondary to disease progression   CURRENT THERAPY:  Systemic chemotherapy with cisplatin 60 mg/M2 and irinotecan 65 mg/M2 on days 1 and 8 every 3 weeks. Status post 2 cycles.   INTERVAL HISTORY: [name] 71 y.o. male returns to the clinic today for followup visit. He is status post recent hospitalization for a right ankle fracture and is status post surgical repair with per his understanding plates and pins. He has some discomfort in the ankle but voices no other specific complaints today.   He denied having any significant weight loss or night sweats. He has no nausea or vomiting. No fever or chills. The patient denied having any significant chest pain but continues to have shortness breath with exertion, no cough or hemoptysis. He is been tolerating his chemotherapy without any significant difficulty. He is here to proceed with cycle #3 of his systemic chemotherapy with cisplatin and irinotecan.  MEDICAL HISTORY: Past Medical History  Diagnosis Date  . Hypertension   . ALLERGIC RHINITIS   . Malignant neoplasm of bronchus and lung, unspecified site 01/23/2012    ALLERGIES:   has no known allergies.  MEDICATIONS:  Current Outpatient Prescriptions  Medication Sig Dispense Refill  . aspirin EC 325 MG tablet Take 1 tablet (325 mg total) by mouth 2 (two) times daily.  30 tablet  0  . ciprofloxacin (CIPRO) 500 MG tablet Take 1 tablet by mouth Twice daily.      . folic acid (FOLVITE) 1 MG tablet Take 1 tablet (1 mg  total) by mouth daily.  30 tablet  1  . olmesartan-hydrochlorothiazide (BENICAR HCT) 40-12.5 MG per tablet Take 0.5 tablets by mouth daily.       Marland Kitchen oxyCODONE (OXY IR/ROXICODONE) 5 MG immediate release tablet Take 1 tablet (5 mg total) by mouth every 6 (six) hours as needed.  90 tablet  0  . vitamin B-12 (CYANOCOBALAMIN) 1000 MCG tablet Take 1,000 mcg by mouth daily.        SURGICAL HISTORY:  Past Surgical History  Procedure Date  . Wrist surgery 1961    right wrist  . Orif ankle fracture 05/07/2012    Procedure: OPEN REDUCTION INTERNAL FIXATION (ORIF) ANKLE FRACTURE;  Surgeon: Mable Paris, MD;  Location: [facility];  Service: Orthopedics;  Laterality: Right;    REVIEW OF SYSTEMS:  Pertinent items are noted in HPI.   PHYSICAL EXAMINATION: General appearance: alert, cooperative and no distress Head: Normocephalic, without obvious abnormality, atraumatic Neck: no adenopathy Lymph nodes: Cervical, supraclavicular, and axillary nodes normal. Resp: clear to auscultation bilaterally Cardio: regular rate and rhythm, S1, S2 normal, no murmur, click, rub or gallop GI: soft, non-tender; bowel sounds normal; no masses,  no organomegaly Extremities: extremities normal, atraumatic, no cyanosis or edema Neurologic: Alert and oriented X 3, normal strength and tone. Normal symmetric reflexes. Normal coordination and gait  ECOG PERFORMANCE STATUS: 1 - Symptomatic but completely ambulatory  There were no vitals taken for this visit.  LABORATORY DATA: Lab Results  Component Value Date   WBC 3.0* 05/22/2012   HGB 13.0 05/22/2012   HCT 37.2* 05/22/2012   MCV 101.6* 05/22/2012   PLT 149 05/22/2012      Chemistry      Component Value Date/Time   NA 139 05/22/2012 0914   NA 141 05/11/2012 0625   K 4.0 05/22/2012 0914   K 3.9 05/11/2012 0625   CL 103 05/22/2012 0914   CL 105 05/11/2012 0625   CO2 30* 05/22/2012 0914   CO2 27 05/11/2012 0625   BUN 19.0 05/22/2012 0914   BUN 27*  05/11/2012 0625   CREATININE 1.4* 05/22/2012 0914   CREATININE 1.26 05/11/2012 0625      Component Value Date/Time   CALCIUM 10.1 05/22/2012 0914   CALCIUM 10.7* 05/11/2012 0625   ALKPHOS 91 05/22/2012 0914   ALKPHOS 57 05/11/2012 0625   AST 17 05/22/2012 0914   AST 15 05/11/2012 0625   ALT 15 05/22/2012 0914   ALT 11 05/11/2012 0625   BILITOT 0.47 05/22/2012 0914   BILITOT 0.4 05/11/2012 0625       RADIOGRAPHIC STUDIES: Ct Chest W Contrast  03/09/2012  *RADIOLOGY REPORT*  Clinical Data:  Recently diagnosed lung cancer currently undergoing chemotherapy.  Cough.  Restaging scan.  CT CHEST, ABDOMEN AND PELVIS WITH CONTRAST  Technique:  Multidetector CT imaging of the chest, abdomen and pelvis was performed following the standard protocol during bolus administration of intravenous contrast.  Contrast: OMNIPAQUE IOHEXOL 300 MG/ML  SOLN  Comparison:  Chest abdomen and pelvis CT 01/10/2012.  PET CT 01/27/2012.  CT CHEST  Findings:  Mediastinum: Heart size is mildly enlarged. There is no significant pericardial fluid, thickening or pericardial calcification. There is atherosclerosis of the thoracic aorta, the great vessels of the mediastinum and the coronary arteries, including calcified atherosclerotic plaque in the left main, left anterior descending, left circumflex and right coronary arteries. Calcifications of the aortic valve. Infrahilar lymph nodes in the right lower lobe measuring 1.7 cm in short axis have increased slightly in size compared the prior examination.  However, previously noted subcarinal nodal mass is smaller, currently measuring 1.6 cm in short axis (previously 2.2 cm).  3.2 x 1.6 cm sessile low attenuation lesion immediately lateral to the superior cavoatrial junction and is similar to prior examinations, and favored to represent a pericardial cyst.  Esophagus is unremarkable in appearance.  Lungs/Pleura: Compared to the prior examination, the macrolobulated right lower  lobe mass appears to have slightly increased in size, currently measuring 3.3 x 2.7 cm (previously 3.2 x 2.3 cm).  There are distal postobstructive changes in the periphery of the right lower lobe, with increasing peripheral nodularity in the right lower lobe, the main portion of the which currently measures up to 2.9 x 1.7 cm on image 38 of series 2.  It is uncertain whether not this simply reflects postobstructive pneumonia, or whether not this is indicative of lymphangitic spread of disease with further right lower lobe metastases.  The 1.3 x 1.0 cm right lower lobe pulmonary nodule (image 44 of series 4) has slightly decreased in size.  9 mm left lower lobe nodule (image 46 of series 4) is unchanged in size. 9 mm pleural based nodule (image 30 of series 2) has slightly increased in size (previously 7 mm).  There are patchy areas of subpleural reticulation and some scattered areas of frank honeycombing in the periphery of the lungs bilaterally, similar to the prior study, suggesting either areas of post infectious/inflammatory scarring,  or an underlying interstitial lung disease.  Trace right-sided pleural effusion layering dependently.  Musculoskeletal: There are no aggressive appearing lytic or blastic lesions noted in the visualized portions of the skeleton. Bilateral gynecomastia (right greater than left).  IMPRESSION:  1.  Today's study demonstrates a mixed response to therapy. Specifically, while there has been decrease in size of subcarinal lymphadenopathy, the right hilar adenopathy, primary right lower lobe mass and other smaller pulmonary nodules in the lungs bilaterally have generally increased compared to the prior examination, as detailed above. Accordingly, the overall appearance is most suggestive of progression of disease. 2. Atherosclerosis, including left main and three-vessel coronary artery disease. 3.  Mild cardiomegaly is unchanged. 4.  Smoothly marginated low attenuation lesion adjacent to  the superior cavoatrial junction is favored to represent a small pericardial cyst and is similar to priors. 5.  Gynecomastia.   CT ABDOMEN AND PELVIS  Findings:  Abdomen/Pelvis: The appearance of the liver, gallbladder, pancreas, spleen and bilateral adrenal glands is unremarkable.  Numerous nonenhancing well-defined low attenuation lesions in the kidneys bilaterally are similar to prior examinations and compatible with simple cyst.  There are other subcentimeter low attenuation lesions in the kidneys bilaterally which are too small to definitively characterize, but are also similar, and are also favored to represent small cystic lesions.  Extensive atherosclerosis of the abdominal and pelvic vasculature, without definite aneurysm, dissection or major branch vessel occlusion.  Normal appendix.  No ascites or pneumoperitoneum and no pathologic distension of bowel. No definite pathologic lymphadenopathy identified within the abdomen or pelvis.  Prostate is mildly enlarged.  Urinary bladder is unremarkable in appearance.  Musculoskeletal: There are no aggressive appearing lytic or blastic lesions noted in the visualized portions of the skeleton.  IMPRESSION:  1.  No findings to suggest metastatic disease to the abdomen or pelvis on today's examination. 2.  Multiple bilateral renal cysts appear similar to prior examinations. 3.  Extensive atherosclerosis. 4.  Prostatomegaly.   Original Report Authenticated By: Florencia Reasons, M.D.      ASSESSMENT/PLAN: This is a very pleasant 71 years old African American male with extensive stage small cell lung cancer status post 2 cycles of systemic chemotherapy with carboplatin and etoposide but unfortunately he continues to have some evidence for disease progression which is very unusual behavior for small cell lung cancer especially with the first line treatment. He is now being treated with cisplatin and irinotecan, status post 2 cycles. The patient was discussed with  Dr. Arbutus Ped. He'll proceed with cycle #3 of his systemic chemotherapy with cisplatin and irinotecan as scheduled. He will continue with labs as per standing orders. He'll followup with Dr. Arbutus Ped in 3 weeks with repeat CBC differential, C. met and magnesium as well as a CT of the chest abdomen and pelvis with contrast to reevaluate his disease. He will followup with orthopedics regarding his right ankle fracture status post surgical repair/fixation as scheduled.   Laural Benes, Nayla Dias E, PA-C   All questions were answered. The patient knows to call the clinic with any problems, questions or concerns. We can certainly see the patient much sooner if necessary.  I spent 20 minutes counseling the patient face to face. The total time spent in the appointment was 30 minutes.

## 2012-06-08 ENCOUNTER — Ambulatory Visit (HOSPITAL_COMMUNITY)
Admission: RE | Admit: 2012-06-08 | Discharge: 2012-06-08 | Disposition: A | Discharge status: Home / Self Care | Payer: Medicare Other | Source: Ambulatory Visit | Attending: Physician Assistant | Admitting: Physician Assistant

## 2012-06-08 DIAGNOSIS — C3490 Malignant neoplasm of unspecified part of unspecified bronchus or lung: Secondary | ICD-10-CM | POA: Insufficient documentation

## 2012-06-08 DIAGNOSIS — I2510 Atherosclerotic heart disease of native coronary artery without angina pectoris: Secondary | ICD-10-CM | POA: Insufficient documentation

## 2012-06-08 MED ORDER — IOHEXOL 300 MG/ML  SOLN
100.0000 mL | Freq: Once | INTRAMUSCULAR | Status: AC | PRN
  Administered 2012-06-08: 100 mL via INTRAVENOUS

## 2012-06-12 ENCOUNTER — Encounter: Payer: Self-pay | Admitting: Internal Medicine

## 2012-06-12 ENCOUNTER — Telehealth: Payer: Self-pay | Admitting: Internal Medicine

## 2012-06-12 ENCOUNTER — Other Ambulatory Visit (HOSPITAL_BASED_OUTPATIENT_CLINIC_OR_DEPARTMENT_OTHER): Payer: Medicare Other | Admitting: Lab

## 2012-06-12 ENCOUNTER — Ambulatory Visit (HOSPITAL_BASED_OUTPATIENT_CLINIC_OR_DEPARTMENT_OTHER): Payer: Medicare Other | Admitting: Internal Medicine

## 2012-06-12 ENCOUNTER — Ambulatory Visit (HOSPITAL_BASED_OUTPATIENT_CLINIC_OR_DEPARTMENT_OTHER): Payer: Medicare Other

## 2012-06-12 VITALS — BP 149/91 | HR 91 | Temp 97.4°F | Resp 20 | Ht 73.0 in | Wt 242.5 lb

## 2012-06-12 DIAGNOSIS — C3490 Malignant neoplasm of unspecified part of unspecified bronchus or lung: Secondary | ICD-10-CM

## 2012-06-12 DIAGNOSIS — Z5111 Encounter for antineoplastic chemotherapy: Secondary | ICD-10-CM

## 2012-06-12 DIAGNOSIS — C3430 Malignant neoplasm of lower lobe, unspecified bronchus or lung: Secondary | ICD-10-CM

## 2012-06-12 LAB — CBC WITH DIFFERENTIAL/PLATELET
Basophils Absolute: 0 10*3/uL (ref 0.0–0.1)
EOS%: 2.1 % (ref 0.0–7.0)
HCT: 38.9 % (ref 38.4–49.9)
HGB: 13.7 g/dL (ref 13.0–17.1)
LYMPH%: 35.4 % (ref 14.0–49.0)
MCH: 35.9 pg — ABNORMAL HIGH (ref 27.2–33.4)
MCHC: 35.2 g/dL (ref 32.0–36.0)
MCV: 101.8 fL — ABNORMAL HIGH (ref 79.3–98.0)
MONO%: 9.8 % (ref 0.0–14.0)
NEUT%: 52.4 % (ref 39.0–75.0)
Platelets: 100 10*3/uL — ABNORMAL LOW (ref 140–400)

## 2012-06-12 LAB — COMPREHENSIVE METABOLIC PANEL (CC13)
Albumin: 3.7 g/dL (ref 3.5–5.0)
Alkaline Phosphatase: 98 U/L (ref 40–150)
BUN: 19 mg/dL (ref 7.0–26.0)
Creatinine: 1.4 mg/dL — ABNORMAL HIGH (ref 0.7–1.3)
Glucose: 115 mg/dl — ABNORMAL HIGH (ref 70–99)
Potassium: 4.5 mEq/L (ref 3.5–5.1)

## 2012-06-12 LAB — MAGNESIUM (CC13): Magnesium: 1.9 mg/dl (ref 1.5–2.5)

## 2012-06-12 MED ORDER — DEXAMETHASONE SODIUM PHOSPHATE 4 MG/ML IJ SOLN
12.0000 mg | Freq: Once | INTRAMUSCULAR | Status: DC
  Filled 2012-06-12: qty 3

## 2012-06-12 MED ORDER — DEXAMETHASONE SODIUM PHOSPHATE 4 MG/ML IJ SOLN: 12.0000 mg | Freq: Once | INTRAMUSCULAR | Status: DC

## 2012-06-12 MED ORDER — DEXAMETHASONE SODIUM PHOSPHATE 10 MG/ML IJ SOLN
10.0000 mg | Freq: Once | INTRAMUSCULAR | Status: AC
  Administered 2012-06-12: 10 mg via INTRAVENOUS

## 2012-06-12 MED ORDER — ATROPINE SULFATE 0.4 MG/ML IJ SOLN
0.5000 mg | Freq: Once | INTRAMUSCULAR | Status: DC | PRN
  Filled 2012-06-12: qty 1.25

## 2012-06-12 MED ORDER — POTASSIUM CHLORIDE 2 MEQ/ML IV SOLN
Freq: Once | INTRAVENOUS | Status: AC
  Administered 2012-06-12: 11:00:00 via INTRAVENOUS
  Filled 2012-06-12: qty 10

## 2012-06-12 MED ORDER — SODIUM CHLORIDE 0.9 % IV SOLN
30.0000 mg/m2 | Freq: Once | INTRAVENOUS | Status: AC
  Administered 2012-06-12: 73 mg via INTRAVENOUS
  Filled 2012-06-12: qty 73

## 2012-06-12 MED ORDER — IRINOTECAN HCL CHEMO INJECTION 100 MG/5ML
160.0000 mg | Freq: Once | INTRAVENOUS | Status: AC
  Administered 2012-06-12: 160 mg via INTRAVENOUS
  Filled 2012-06-12: qty 8

## 2012-06-12 MED ORDER — PALONOSETRON HCL INJECTION 0.25 MG/5ML
0.2500 mg | Freq: Once | INTRAVENOUS | Status: AC
  Administered 2012-06-12: 0.25 mg via INTRAVENOUS

## 2012-06-12 MED ORDER — FOSAPREPITANT DIMEGLUMINE INJECTION 150 MG
150.0000 mg | Freq: Once | INTRAVENOUS | Status: AC
  Administered 2012-06-12: 150 mg via INTRAVENOUS
  Filled 2012-06-12: qty 5

## 2012-06-12 MED ORDER — SODIUM CHLORIDE 0.9 % IV SOLN
Freq: Once | INTRAVENOUS | Status: AC
  Administered 2012-06-12: 12:00:00 via INTRAVENOUS

## 2012-06-12 NOTE — Telephone Encounter (Signed)
gv and printed appt schedule for pt for Jan 2014. °

## 2012-06-12 NOTE — Patient Instructions (Addendum)
[facility] Discharge Instructions for Patients Receiving Chemotherapy  Today you received the following chemotherapy agents:  Irinotecan, Cisplatin.  To help prevent nausea and vomiting after your treatment, we encourage you to take your nausea medication.    If you develop nausea and vomiting that is not controlled by your nausea medication, call the clinic. If it is after clinic hours your family physician or the after hours number for the clinic or go to the Emergency Department.   BELOW ARE SYMPTOMS THAT SHOULD BE REPORTED IMMEDIATELY:  *FEVER GREATER THAN 100.5 F  *CHILLS WITH OR WITHOUT FEVER  NAUSEA AND VOMITING THAT IS NOT CONTROLLED WITH YOUR NAUSEA MEDICATION  *UNUSUAL SHORTNESS OF BREATH  *UNUSUAL BRUISING OR BLEEDING  TENDERNESS IN MOUTH AND THROAT WITH OR WITHOUT PRESENCE OF ULCERS  *URINARY PROBLEMS  *BOWEL PROBLEMS  UNUSUAL RASH Items with * indicate a potential emergency and should be followed up as soon as possible.  Feel free to call the clinic you have any questions or concerns. The clinic phone number is [phone].

## 2012-06-12 NOTE — Patient Instructions (Signed)
The scan showed improvement in your disease in the chest. Continue systemic chemotherapy with the same regimen. Followup in 3 weeks.

## 2012-06-12 NOTE — Progress Notes (Signed)
[facility] Telephone:(336) [phone]   Fax:(336) [phone]  OFFICE PROGRESS NOTE  [name], MD [phone] [address] [place] [address]  DIAGNOSIS: Extensive stage small cell lung cancer diagnosed in August of 2013.   PRIOR THERAPY: Systemic chemotherapy in the form of carboplatin for an AUC of 5 given on day 1 and etoposide 100 mg per meter squared given on days 1, 2 and 3 with Neulasta support given on day 4. Status post 2 cycle, discontinued today secondary to disease progression   CURRENT THERAPY: Systemic chemotherapy with cisplatin 30 mg/M2 and irinotecan 65 mg/M2 on days 1 and 8 every 3 weeks. Status post 3 cycles.   INTERVAL HISTORY: [name] 71 y.o. male returns to the clinic today for followup visit. The patient tolerated the last cycle of his systemic chemotherapy with cisplatin and irinotecan fairly well. He denied having any significant nausea or vomiting, no fever or chills. He denied having any significant chest pain, shortness breath, cough or hemoptysis. The patient had repeat CT scan of the chest, abdomen and pelvis performed recently and he is here for evaluation and discussion of his scan results.  MEDICAL HISTORY: Past Medical History  Diagnosis Date  . Hypertension   . ALLERGIC RHINITIS   . Malignant neoplasm of bronchus and lung, unspecified site 01/23/2012    ALLERGIES:   has no known allergies.  MEDICATIONS:  Current Outpatient Prescriptions  Medication Sig Dispense Refill  . aspirin EC 325 MG tablet Take 1 tablet (325 mg total) by mouth 2 (two) times daily.  30 tablet  0  . ciprofloxacin (CIPRO) 500 MG tablet Take 1 tablet by mouth Twice daily.      . folic acid (FOLVITE) 1 MG tablet Take 1 tablet (1 mg total) by mouth daily.  30 tablet  1  . olmesartan-hydrochlorothiazide (BENICAR HCT) 40-12.5 MG per tablet Take 0.5 tablets by mouth daily.       Marland Kitchen oxyCODONE (OXY IR/ROXICODONE) 5 MG immediate release tablet Take 1 tablet (5 mg  total) by mouth every 6 (six) hours as needed.  90 tablet  0  . vitamin B-12 (CYANOCOBALAMIN) 1000 MCG tablet Take 1,000 mcg by mouth daily.        SURGICAL HISTORY:  Past Surgical History  Procedure Date  . Wrist surgery 1961    right wrist  . Orif ankle fracture 05/07/2012    Procedure: OPEN REDUCTION INTERNAL FIXATION (ORIF) ANKLE FRACTURE;  Surgeon: Mable Paris, MD;  Location: [facility];  Service: Orthopedics;  Laterality: Right;    REVIEW OF SYSTEMS:  A comprehensive review of systems was negative except for: Constitutional: positive for fatigue Respiratory: positive for dyspnea on exertion   PHYSICAL EXAMINATION: General appearance: alert, cooperative, fatigued and no distress Head: Normocephalic, without obvious abnormality, atraumatic Neck: no adenopathy Lymph nodes: Cervical, supraclavicular, and axillary nodes normal. Resp: clear to auscultation bilaterally Cardio: regular rate and rhythm, S1, S2 normal, no murmur, click, rub or gallop GI: soft, non-tender; bowel sounds normal; no masses,  no organomegaly Extremities: extremities normal, atraumatic, no cyanosis or edema Neurologic: Alert and oriented X 3, normal strength and tone. Normal symmetric reflexes. Normal coordination and gait  ECOG PERFORMANCE STATUS: 1 - Symptomatic but completely ambulatory  Blood pressure 149/91, pulse 91, temperature 97.4 F (36.3 C), temperature source Oral, resp. rate 20, height 6\' 1"  (1.854 m), weight 242 lb 8 oz (109.997 kg).  LABORATORY DATA: Lab Results  Component Value Date  WBC 3.3* 06/12/2012   HGB 13.7 06/12/2012   HCT 38.9 06/12/2012   MCV 101.8* 06/12/2012   PLT 100* 06/12/2012      Chemistry      Component Value Date/Time   NA 139 05/22/2012 0914   NA 141 05/11/2012 0625   K 4.0 05/22/2012 0914   K 3.9 05/11/2012 0625   CL 103 05/22/2012 0914   CL 105 05/11/2012 0625   CO2 30* 05/22/2012 0914   CO2 27 05/11/2012 0625   BUN 19.0 05/22/2012 0914   BUN  27* 05/11/2012 0625   CREATININE 1.4* 05/22/2012 0914   CREATININE 1.26 05/11/2012 0625      Component Value Date/Time   CALCIUM 10.1 05/22/2012 0914   CALCIUM 10.7* 05/11/2012 0625   ALKPHOS 91 05/22/2012 0914   ALKPHOS 57 05/11/2012 0625   AST 17 05/22/2012 0914   AST 15 05/11/2012 0625   ALT 15 05/22/2012 0914   ALT 11 05/11/2012 0625   BILITOT 0.47 05/22/2012 0914   BILITOT 0.4 05/11/2012 0625       RADIOGRAPHIC STUDIES: Ct Chest W Contrast  06/08/2012  *RADIOLOGY REPORT*  Clinical Data:  Follow-up lung cancer.  Ongoing chemotherapy.  CT CHEST, ABDOMEN AND PELVIS WITH CONTRAST  Technique:  Multidetector CT imaging of the chest, abdomen and pelvis was performed following the standard protocol during bolus administration of intravenous contrast.  Contrast: OMNIPAQUE IOHEXOL 300 MG/ML  SOLN  Comparison:  03/09/2012.  PET 01/27/2012.   CT CHEST  Findings:  Mediastinal lymph nodes measure up to 12 mm anterior to the right mainstem bronchus, as before.  Right hilar lymph node measures 7 mm.  No axillary adenopathy.  Bilateral gynecomastia. Heart is at the upper limits of normal in size.  No pericardial effusion. Probable small low attenuation pericardial cyst along the right heart border, measuring 1.3 x 2.6 cm, stable.  Coronary artery calcification.  Emphysema is predominantly paraseptal.  Suspect mild superimposed subpleural reticulation, basilar predominant.  Right lower lobe nodule measures 1.7 x 1.3 cm on image 38 (previously 2.1 x 1.9 cm). Mass-like soft tissue in the right infrahilar region measures 1.9 x 3.5 cm (previously 3.3 x 4.2 cm).  Additional scattered pulmonary parenchyma and pleural nodules in the lower lobes are unchanged in size.  A 9 mm extrapleural lymph node in the lateral right hemithorax (image 31) is unchanged.  IMPRESSION:  1.  Interval decrease in size of right infrahilar and right lower lobe areas of nodular or mass-like soft tissue.  Additional pulmonary  nodules and pleural/extrapleural nodularity appear stable. 2.  Stable mediastinal and right hilar lymph nodes. 3.  Suspect mild subpleural fibrosis superimposed on paraseptal emphysema. 4.  Coronary artery calcification.   CT ABDOMEN AND PELVIS  Findings:  Liver, gallbladder and adrenal glands are unremarkable. There are several low attenuation lesions in the kidneys, measuring up to 5.1 cm on the left.  Statistically, these are likely cysts. Spleen, pancreas, stomach and bowel are unremarkable.  Prostate is mildly enlarged.  There are small bilateral inguinal hernias containing fat.  Atherosclerotic calcification of the arterial vasculature without abdominal aortic aneurysm.  No pathologically enlarged lymph nodes. No free fluid.  No worrisome lytic or sclerotic lesions.  IMPRESSION:  No evidence of metastatic disease in the abdomen or pelvis.   Original Report Authenticated By: Leanna Battles, M.D.    ASSESSMENT: This is a very pleasant 71 years old African American male with extensive stage small cell lung cancer currently on second line chemotherapy  with cisplatin and irinotecan is status post 3 cycles. The patient is tolerating his treatment fairly well and he has some improvement in his disease on the recent CT scan of the chest, abdomen and pelvis.  PLAN: I discussed the scan results with the patient today. I recommended for him to continue with the same systemic chemotherapy. He will start cycle #4 today. The patient would come back for followup visit in 3 weeks with the start of cycle #5.  He was advised to call immediately if he has any concerning symptoms in the interval.  All questions were answered. The patient knows to call the clinic with any problems, questions or concerns. We can certainly see the patient much sooner if necessary.  I spent 15 minutes counseling the patient face to face. The total time spent in the appointment was 25 minutes.

## 2012-06-18 ENCOUNTER — Ambulatory Visit: Payer: Medicare Other | Admitting: Internal Medicine

## 2012-06-18 ENCOUNTER — Other Ambulatory Visit: Payer: Self-pay | Admitting: Medical Oncology

## 2012-06-18 ENCOUNTER — Other Ambulatory Visit (HOSPITAL_BASED_OUTPATIENT_CLINIC_OR_DEPARTMENT_OTHER): Payer: Medicare Other | Admitting: Lab

## 2012-06-18 ENCOUNTER — Telehealth: Payer: Self-pay | Admitting: Internal Medicine

## 2012-06-18 VITALS — BP 118/77 | HR 83 | Temp 97.1°F | Resp 20 | Ht 73.0 in | Wt 239.4 lb

## 2012-06-18 DIAGNOSIS — C3490 Malignant neoplasm of unspecified part of unspecified bronchus or lung: Secondary | ICD-10-CM

## 2012-06-18 LAB — COMPREHENSIVE METABOLIC PANEL (CC13)
ALT: 12 U/L (ref 0–55)
AST: 18 U/L (ref 5–34)
Alkaline Phosphatase: 76 U/L (ref 40–150)
CO2: 26 mEq/L (ref 22–29)
Creatinine: 1.7 mg/dL — ABNORMAL HIGH (ref 0.7–1.3)
Sodium: 139 mEq/L (ref 136–145)
Total Bilirubin: 0.4 mg/dL (ref 0.20–1.20)
Total Protein: 7 g/dL (ref 6.4–8.3)

## 2012-06-18 LAB — MAGNESIUM (CC13): Magnesium: 1.6 mg/dl (ref 1.5–2.5)

## 2012-06-18 LAB — CBC WITH DIFFERENTIAL/PLATELET
Eosinophils Absolute: 0 10*3/uL (ref 0.0–0.5)
MONO#: 0.2 10*3/uL (ref 0.1–0.9)
NEUT#: 2.1 10*3/uL (ref 1.5–6.5)
RBC: 3.59 10*6/uL — ABNORMAL LOW (ref 4.20–5.82)
RDW: 13.6 % (ref 11.0–14.6)
WBC: 3.6 10*3/uL — ABNORMAL LOW (ref 4.0–10.3)
lymph#: 1.3 10*3/uL (ref 0.9–3.3)

## 2012-06-18 NOTE — Telephone Encounter (Signed)
gv and printed appt schedule for pt for Jan.... S.w. Marcelino Duster to add chemo...emailed her also

## 2012-06-18 NOTE — Progress Notes (Signed)
Error encounter. 

## 2012-06-19 ENCOUNTER — Other Ambulatory Visit: Payer: Self-pay | Admitting: Pharmacist

## 2012-06-19 DIAGNOSIS — C3490 Malignant neoplasm of unspecified part of unspecified bronchus or lung: Secondary | ICD-10-CM

## 2012-06-20 ENCOUNTER — Telehealth: Payer: Self-pay | Admitting: Internal Medicine

## 2012-06-20 ENCOUNTER — Ambulatory Visit: Payer: Medicare Other

## 2012-06-20 NOTE — Telephone Encounter (Signed)
Pt stopped by for [place] to change his lab appt on 1/13,done and marked on his sch for him       anne

## 2012-06-20 NOTE — Progress Notes (Signed)
Platelets 78 - do not treat per Dr. Arbutus Ped

## 2012-06-21 ENCOUNTER — Telehealth: Payer: Self-pay | Admitting: Medical Oncology

## 2012-06-21 NOTE — Telephone Encounter (Signed)
I gave instructions to his wife.

## 2012-06-21 NOTE — Telephone Encounter (Signed)
Message copied by Charma Igo on Thu Jun 21, 2012 11:00 AM ------      Message from: Conni Slipper      Created: Tue Jun 19, 2012 10:55 PM       Abnormal results, please call  notify patient that creatinine is elevated. Have him push po fluids.

## 2012-06-23 ENCOUNTER — Emergency Department (HOSPITAL_COMMUNITY)
Admission: EM | Admit: 2012-06-23 | Discharge: 2012-06-23 | Disposition: A | Discharge status: Home / Self Care | Payer: Medicare Other | Attending: Emergency Medicine | Admitting: Emergency Medicine

## 2012-06-23 DIAGNOSIS — J309 Allergic rhinitis, unspecified: Secondary | ICD-10-CM | POA: Insufficient documentation

## 2012-06-23 DIAGNOSIS — I10 Essential (primary) hypertension: Secondary | ICD-10-CM | POA: Insufficient documentation

## 2012-06-23 DIAGNOSIS — Z79899 Other long term (current) drug therapy: Secondary | ICD-10-CM | POA: Insufficient documentation

## 2012-06-23 DIAGNOSIS — Z9889 Other specified postprocedural states: Secondary | ICD-10-CM | POA: Insufficient documentation

## 2012-06-23 DIAGNOSIS — Z7982 Long term (current) use of aspirin: Secondary | ICD-10-CM | POA: Insufficient documentation

## 2012-06-23 DIAGNOSIS — F17200 Nicotine dependence, unspecified, uncomplicated: Secondary | ICD-10-CM | POA: Insufficient documentation

## 2012-06-23 DIAGNOSIS — Z8781 Personal history of (healed) traumatic fracture: Secondary | ICD-10-CM | POA: Insufficient documentation

## 2012-06-23 DIAGNOSIS — Z85118 Personal history of other malignant neoplasm of bronchus and lung: Secondary | ICD-10-CM | POA: Insufficient documentation

## 2012-06-23 DIAGNOSIS — R350 Frequency of micturition: Secondary | ICD-10-CM

## 2012-06-23 LAB — CBC WITH DIFFERENTIAL/PLATELET
Basophils Absolute: 0 10*3/uL (ref 0.0–0.1)
Eosinophils Absolute: 0 10*3/uL (ref 0.0–0.7)
Eosinophils Relative: 1 % (ref 0–5)
HCT: 33.9 % — ABNORMAL LOW (ref 39.0–52.0)
Hemoglobin: 12.1 g/dL — ABNORMAL LOW (ref 13.0–17.0)
MCH: 36 pg — ABNORMAL HIGH (ref 26.0–34.0)
MCV: 100.9 fL — ABNORMAL HIGH (ref 78.0–100.0)
Monocytes Absolute: 0.3 10*3/uL (ref 0.1–1.0)
Neutrophils Relative %: 62 % (ref 43–77)
Platelets: 76 10*3/uL — ABNORMAL LOW (ref 150–400)
WBC: 4.7 10*3/uL (ref 4.0–10.5)

## 2012-06-23 LAB — URINALYSIS, ROUTINE W REFLEX MICROSCOPIC
Bilirubin Urine: NEGATIVE
Glucose, UA: NEGATIVE mg/dL
Ketones, ur: NEGATIVE mg/dL
Nitrite: NEGATIVE
Specific Gravity, Urine: 1.013 (ref 1.005–1.030)
pH: 6 (ref 5.0–8.0)

## 2012-06-23 LAB — BASIC METABOLIC PANEL
BUN: 20 mg/dL (ref 6–23)
CO2: 27 mEq/L (ref 19–32)
Chloride: 100 mEq/L (ref 96–112)
Creatinine, Ser: 1.27 mg/dL (ref 0.50–1.35)
GFR calc Af Amer: 63 mL/min — ABNORMAL LOW (ref >90)
Potassium: 4.1 mEq/L (ref 3.5–5.1)

## 2012-06-23 LAB — URINE MICROSCOPIC-ADD ON

## 2012-06-23 NOTE — ED Notes (Signed)
Lab bedside.

## 2012-06-23 NOTE — ED Notes (Signed)
Dr Bednar bedside 

## 2012-06-23 NOTE — ED Notes (Signed)
Pt c/o increased frequency of urination since tonight. Pt's nurse from Dr. Sharlene Dory office called and stated pt might be dehydrated. Pt was seen at [facility] on Tuesday for chemo tx. Pt did not receive chemo tx because his platelets were too low. Pt has lung CA. Pt denies pain at present. Pt states he has hx of enlarged prostate.

## 2012-06-23 NOTE — ED Provider Notes (Signed)
History     CSN: [id number]  Arrival date & time 06/23/12  0026   First MD Initiated Contact with Patient 06/23/12 0111      Chief Complaint  Patient presents with  . Increased Urination     (Consider location/radiation/quality/duration/timing/severity/associated sxs/prior treatment) HPI This 72 year old male is a cancer patient who was called by his cancer doctors clinic and told him to drink water because he appeared dehydrated based on blood counts a few days ago, the last couple of days the patient has been drinking a lot of water causing him to have clear urine and frequent urination. He is no dysuria no abdominal pain no chest pain or shortness of breath he is some minimal right flank pain which is not colicky and not sudden or severe. Tonight because he was urinating was seen to him like every 5 minutes with clear urine he came to the ED for evaluation. Now he is here he states he has not urinated in the last few hours. He is now starting to get an urge to urinate again. There is no treatment prior to arrival. The patient does not feel dehydrated at all. He states he feels fine Past Medical History  Diagnosis Date  . Hypertension   . ALLERGIC RHINITIS   . Malignant neoplasm of bronchus and lung, unspecified site 01/23/2012    Past Surgical History  Procedure Date  . Wrist surgery 1961    right wrist  . Orif ankle fracture 05/07/2012    Procedure: OPEN REDUCTION INTERNAL FIXATION (ORIF) ANKLE FRACTURE;  Surgeon: Mable Paris, MD;  Location: [facility];  Service: Orthopedics;  Laterality: Right;    Family History  Problem Relation Age of Onset  . Hypertension Sister   . Hypertension Brother   . Hypertension Mother   . Coronary artery disease Mother     History  Substance Use Topics  . Smoking status: Current Every Day Smoker -- 0.2 packs/day for 50 years    Types: Cigarettes  . Smokeless tobacco: Never Used  . Alcohol Use: No      Review of Systems 10  Systems reviewed and are negative for acute change except as noted in the HPI. Allergies  Review of patient's allergies indicates no known allergies.  Home Medications   Current Outpatient Rx  Name  Route  Sig  Dispense  Refill  . ASPIRIN EC 325 MG PO TBEC   Oral   Take 1 tablet (325 mg total) by mouth 2 (two) times daily.   30 tablet   0   . FOLIC ACID 1 MG PO TABS   Oral   Take 1 tablet (1 mg total) by mouth daily.   30 tablet   1   . OLMESARTAN MEDOXOMIL-HCTZ 40-12.5 MG PO TABS   Oral   Take 0.5 tablets by mouth daily.          . OXYCODONE HCL 5 MG PO TABS   Oral   Take 1 tablet (5 mg total) by mouth every 6 (six) hours as needed.   90 tablet   0   . VITAMIN B-12 1000 MCG PO TABS   Oral   Take 1,000 mcg by mouth daily.           BP 164/91  Pulse 95  Temp 98.8 F (37.1 C) (Oral)  Resp 26  SpO2 97%  Physical Exam  Nursing note and vitals reviewed. Constitutional:       Awake, alert, nontoxic appearance.  HENT:  Head: Atraumatic.  Eyes: Right eye exhibits no discharge. Left eye exhibits no discharge.  Neck: Neck supple.  Cardiovascular: Normal rate and regular rhythm.   No murmur heard. Pulmonary/Chest: Effort normal and breath sounds normal. No respiratory distress. He has no wheezes. He has no rales. He exhibits no tenderness.  Abdominal: Soft. Bowel sounds are normal. He exhibits no distension and no mass. There is no tenderness. There is no rebound and no guarding.       Limited bedside emergency ultrasound reveals post void residual of less than 50 mL's urine in his bladder  Genitourinary:       No CVA tenderness  Musculoskeletal: He exhibits no tenderness.       Baseline ROM, no obvious new focal weakness.  Neurological: He is alert.       Mental status and motor strength appears baseline for patient and situation.  Skin: No rash noted.  Psychiatric: He has a normal mood and affect.    ED Course  Procedures (including critical care  time)  Labs Reviewed  URINALYSIS, ROUTINE W REFLEX MICROSCOPIC - Abnormal; Notable for the following:    Hgb urine dipstick MODERATE (*)     All other components within normal limits  BASIC METABOLIC PANEL - Abnormal; Notable for the following:    Sodium 134 (*)     Glucose, Bld 141 (*)     GFR calc non Af Amer 55 (*)     GFR calc Af Amer 63 (*)     All other components within normal limits  CBC WITH DIFFERENTIAL - Abnormal; Notable for the following:    RBC 3.36 (*)     Hemoglobin 12.1 (*)     HCT 33.9 (*)     MCV 100.9 (*)     MCH 36.0 (*)     Platelets 76 (*)     All other components within normal limits  URINE MICROSCOPIC-ADD ON   No results found.   1. Urinary frequency       MDM  Disposition and labs pending with care endorsed to Dr. Dierdre Highman.        Hurman Horn, MD 06/23/12 [phone]

## 2012-06-23 NOTE — ED Provider Notes (Signed)
Labs reviewed and patient remains no acute distress, vital signs reviewed, nursing notes reviewed. Plan discharge home with close outpatient followup. Patient is a reliable historian and agrees to return cautions for any worsening condition. He is stable for discharge home.  Results for orders placed during the hospital encounter of 06/23/12  URINALYSIS, ROUTINE W REFLEX MICROSCOPIC      Component Value Range   Color, Urine YELLOW  YELLOW   APPearance CLEAR  CLEAR   Specific Gravity, Urine 1.013  1.005 - 1.030   pH 6.0  5.0 - 8.0   Glucose, UA NEGATIVE  NEGATIVE mg/dL   Hgb urine dipstick MODERATE (*) NEGATIVE   Bilirubin Urine NEGATIVE  NEGATIVE   Ketones, ur NEGATIVE  NEGATIVE mg/dL   Protein, ur NEGATIVE  NEGATIVE mg/dL   Urobilinogen, UA 0.2  0.0 - 1.0 mg/dL   Nitrite NEGATIVE  NEGATIVE   Leukocytes, UA NEGATIVE  NEGATIVE  BASIC METABOLIC PANEL      Component Value Range   Sodium 134 (*) 135 - 145 mEq/L   Potassium 4.1  3.5 - 5.1 mEq/L   Chloride 100  96 - 112 mEq/L   CO2 27  19 - 32 mEq/L   Glucose, Bld 141 (*) 70 - 99 mg/dL   BUN 20  6 - 23 mg/dL   Creatinine, Ser 1.61  0.50 - 1.35 mg/dL   Calcium 09.6  8.4 - 04.5 mg/dL   GFR calc non Af Amer 55 (*) >90 mL/min   GFR calc Af Amer 63 (*) >90 mL/min  CBC WITH DIFFERENTIAL      Component Value Range   WBC 4.7  4.0 - 10.5 K/uL   RBC 3.36 (*) 4.22 - 5.81 MIL/uL   Hemoglobin 12.1 (*) 13.0 - 17.0 g/dL   HCT 40.9 (*) 81.1 - 91.4 %   MCV 100.9 (*) 78.0 - 100.0 fL   MCH 36.0 (*) 26.0 - 34.0 pg   MCHC 35.7  30.0 - 36.0 g/dL   RDW 78.2  95.6 - 21.3 %   Platelets 76 (*) 150 - 400 K/uL   Neutrophils Relative 62  43 - 77 %   Lymphocytes Relative 30  12 - 46 %   Monocytes Relative 7  3 - 12 %   Eosinophils Relative 1  0 - 5 %   Basophils Relative 0  0 - 1 %   Neutro Abs 3.0  1.7 - 7.7 K/uL   Lymphs Abs 1.4  0.7 - 4.0 K/uL   Monocytes Absolute 0.3  0.1 - 1.0 K/uL   Eosinophils Absolute 0.0  0.0 - 0.7 K/uL   Basophils Absolute 0.0   0.0 - 0.1 K/uL   RBC Morphology POLYCHROMASIA PRESENT    URINE MICROSCOPIC-ADD ON      Component Value Range   WBC, UA 0-2  <3 WBC/hpf   RBC / HPF 3-6  <3 RBC/hpf        Sunnie Nielsen, MD 06/23/12 [phone]

## 2012-06-25 ENCOUNTER — Other Ambulatory Visit: Payer: Medicare Other | Admitting: Lab

## 2012-06-25 ENCOUNTER — Other Ambulatory Visit (HOSPITAL_BASED_OUTPATIENT_CLINIC_OR_DEPARTMENT_OTHER): Payer: Medicare Other | Admitting: Lab

## 2012-06-25 DIAGNOSIS — C3490 Malignant neoplasm of unspecified part of unspecified bronchus or lung: Secondary | ICD-10-CM

## 2012-06-25 DIAGNOSIS — C3430 Malignant neoplasm of lower lobe, unspecified bronchus or lung: Secondary | ICD-10-CM

## 2012-06-25 LAB — COMPREHENSIVE METABOLIC PANEL (CC13)
ALT: 14 U/L (ref 0–55)
AST: 20 U/L (ref 5–34)
Albumin: 3.3 g/dL — ABNORMAL LOW (ref 3.5–5.0)
BUN: 20 mg/dL (ref 7.0–26.0)
Calcium: 10.2 mg/dL (ref 8.4–10.4)
Chloride: 105 mEq/L (ref 98–107)
Potassium: 4.2 mEq/L (ref 3.5–5.1)

## 2012-07-02 ENCOUNTER — Other Ambulatory Visit (HOSPITAL_BASED_OUTPATIENT_CLINIC_OR_DEPARTMENT_OTHER): Payer: Medicare Other | Admitting: Lab

## 2012-07-02 ENCOUNTER — Telehealth: Payer: Self-pay | Admitting: *Deleted

## 2012-07-02 ENCOUNTER — Ambulatory Visit (HOSPITAL_BASED_OUTPATIENT_CLINIC_OR_DEPARTMENT_OTHER): Payer: Medicare Other | Admitting: Physician Assistant

## 2012-07-02 ENCOUNTER — Telehealth: Payer: Self-pay | Admitting: Internal Medicine

## 2012-07-02 ENCOUNTER — Encounter: Payer: Self-pay | Admitting: Physician Assistant

## 2012-07-02 VITALS — BP 147/97 | HR 96 | Temp 97.3°F | Resp 20 | Ht 73.0 in | Wt 246.5 lb

## 2012-07-02 DIAGNOSIS — C3490 Malignant neoplasm of unspecified part of unspecified bronchus or lung: Secondary | ICD-10-CM

## 2012-07-02 DIAGNOSIS — C7A1 Malignant poorly differentiated neuroendocrine tumors: Secondary | ICD-10-CM

## 2012-07-02 DIAGNOSIS — R3911 Hesitancy of micturition: Secondary | ICD-10-CM

## 2012-07-02 DIAGNOSIS — D696 Thrombocytopenia, unspecified: Secondary | ICD-10-CM

## 2012-07-02 LAB — CBC WITH DIFFERENTIAL/PLATELET
BASO%: 0.2 % (ref 0.0–2.0)
EOS%: 2.4 % (ref 0.0–7.0)
MCH: 37.1 pg — ABNORMAL HIGH (ref 27.2–33.4)
MCHC: 35.5 g/dL (ref 32.0–36.0)
RBC: 3.31 10*6/uL — ABNORMAL LOW (ref 4.20–5.82)
RDW: 14.3 % (ref 11.0–14.6)
lymph#: 1.4 10*3/uL (ref 0.9–3.3)

## 2012-07-02 LAB — COMPREHENSIVE METABOLIC PANEL (CC13)
Alkaline Phosphatase: 92 U/L (ref 40–150)
Chloride: 102 mEq/L (ref 98–107)
Potassium: 4.1 mEq/L (ref 3.5–5.1)
Total Bilirubin: 0.41 mg/dL (ref 0.20–1.20)
Total Protein: 6.9 g/dL (ref 6.4–8.3)

## 2012-07-02 LAB — MAGNESIUM (CC13): Magnesium: 1.6 mg/dl (ref 1.5–2.5)

## 2012-07-02 NOTE — Telephone Encounter (Signed)
Per staff phone call and POF I have schedueld appts.  JMW  

## 2012-07-02 NOTE — Telephone Encounter (Signed)
Gave pt appt for lab, chemo and ML for January and February 2014, emailed Dr, Arbutus Ped and Tiana Loft, ML regarding chemo on 1/27 due to no availability on 07/10/12

## 2012-07-02 NOTE — Patient Instructions (Addendum)
Your platelets are too low to proceed with chemotherapy as scheduled today.Your chemotherapy will be rescheduled to begin next week as long your platelets are in therapeutic range Followup in 2 weeks

## 2012-07-03 ENCOUNTER — Ambulatory Visit: Payer: Medicare Other

## 2012-07-03 NOTE — Progress Notes (Signed)
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   PRIOR THERAPY: Systemic chemotherapy in the form of carboplatin for an AUC of 5 given on day 1 and etoposide 100 mg per meter squared given on days 1, 2 and 3 with Neulasta support given on day 4. Status post 2 cycle, discontinued today secondary to disease progression   CURRENT THERAPY: Systemic chemotherapy with cisplatin 30 mg/M2 and irinotecan 65 mg/M2 on days 1 and 8 every 3 weeks. Status post 3 cycles and day 1 of cycle 4.   INTERVAL HISTORY: [name] 72 y.o. male returns to the clinic today for followup visit. The patient tolerated the last cycle of his systemic chemotherapy with cisplatin and irinotecan fairly well. He denied having any significant nausea or vomiting, no fever or chills. He denied having any significant chest pain, shortness breath, cough or hemoptysis. He denies any problems with bleeding or bruising. He continues to have some swelling and slight instability/unsteadiness related to the right ankle fracture now status post surgical correction/stabilization. He did complete some physical rehabilitation dilatation of the ankle and continues to do some exercises at home. He follows up next with his orthopedist approximately 6 weeks. He reports occasional problems with urination specifically trouble starting to urinate and then only producing small amounts. This happened on a couple occasions but has been relieved once he's taken one of his OxyIR tablets. He reports that while he was in the hospital he was treated for a prostate infection with antibiotics.  MEDICAL HISTORY: Past Medical History  Diagnosis Date  . Hypertension   . ALLERGIC RHINITIS   . Malignant neoplasm of bronchus and lung, unspecified site 01/23/2012     ALLERGIES:   has no known allergies.  MEDICATIONS:  Current Outpatient Prescriptions  Medication Sig Dispense Refill  . aspirin EC 325 MG tablet Take 1 tablet (325 mg total) by mouth 2 (two) times daily.  30 tablet  0  . folic acid (FOLVITE) 1 MG tablet Take 1 tablet (1 mg total) by mouth daily.  30 tablet  1  . olmesartan-hydrochlorothiazide (BENICAR HCT) 40-12.5 MG per tablet Take 0.5 tablets by mouth daily.       Marland Kitchen oxyCODONE (OXY IR/ROXICODONE) 5 MG immediate release tablet Take 1 tablet (5 mg total) by mouth every 6 (six) hours as needed.  90 tablet  0  . vitamin B-12 (CYANOCOBALAMIN) 1000 MCG tablet Take 1,000 mcg by mouth daily.        SURGICAL HISTORY:  Past Surgical History  Procedure Date  . Wrist surgery 1961    right wrist  . Orif ankle fracture 05/07/2012    Procedure: OPEN REDUCTION INTERNAL FIXATION (ORIF) ANKLE FRACTURE;  Surgeon: Mable Paris, MD;  Location: [facility];  Service: Orthopedics;  Laterality: Right;    REVIEW OF SYSTEMS:  Pertinent items are noted in HPI.   PHYSICAL EXAMINATION: General appearance: alert, cooperative, fatigued and no distress Head: Normocephalic, without obvious abnormality, atraumatic Neck: no adenopathy Lymph nodes: Cervical, supraclavicular, and axillary nodes normal. Resp: clear to auscultation bilaterally Cardio: regular rate and rhythm, S1, S2 normal, no murmur, click, rub or gallop GI: soft, non-tender; bowel sounds normal; no masses,  no organomegaly Extremities: extremities normal, atraumatic, no cyanosis or edema Neurologic: Alert and oriented X 3, normal strength and  tone. Normal symmetric reflexes. Normal coordination and gait  ECOG PERFORMANCE STATUS: 1 - Symptomatic but completely ambulatory  Blood pressure 147/97, pulse 96, temperature 97.3 F (36.3 C), temperature source Oral, resp. rate 20, height 6\' 1"  (1.854 m), weight 246 lb 8 oz (111.812 kg).  LABORATORY DATA: Lab Results  Component Value Date    WBC 3.6* 07/02/2012   HGB 12.3* 07/02/2012   HCT 34.6* 07/02/2012   MCV 104.6* 07/02/2012   PLT 68* 07/02/2012      Chemistry      Component Value Date/Time   NA 139 07/02/2012 1407   NA 134* 06/23/2012 0228   K 4.1 07/02/2012 1407   K 4.1 06/23/2012 0228   CL 102 07/02/2012 1407   CL 100 06/23/2012 0228   CO2 29 07/02/2012 1407   CO2 27 06/23/2012 0228   BUN 15.0 07/02/2012 1407   BUN 20 06/23/2012 0228   CREATININE 1.5* 07/02/2012 1407   CREATININE 1.27 06/23/2012 0228      Component Value Date/Time   CALCIUM 10.2 07/02/2012 1407   CALCIUM 10.0 06/23/2012 0228   ALKPHOS 92 07/02/2012 1407   ALKPHOS 57 05/11/2012 0625   AST 20 07/02/2012 1407   AST 15 05/11/2012 0625   ALT 15 07/02/2012 1407   ALT 11 05/11/2012 0625   BILITOT 0.41 07/02/2012 1407   BILITOT 0.4 05/11/2012 0625       RADIOGRAPHIC STUDIES: Ct Chest W Contrast  06/08/2012  *RADIOLOGY REPORT*  Clinical Data:  Follow-up lung cancer.  Ongoing chemotherapy.  CT CHEST, ABDOMEN AND PELVIS WITH CONTRAST  Technique:  Multidetector CT imaging of the chest, abdomen and pelvis was performed following the standard protocol during bolus administration of intravenous contrast.  Contrast: OMNIPAQUE IOHEXOL 300 MG/ML  SOLN  Comparison:  03/09/2012.  PET 01/27/2012.   CT CHEST  Findings:  Mediastinal lymph nodes measure up to 12 mm anterior to the right mainstem bronchus, as before.  Right hilar lymph node measures 7 mm.  No axillary adenopathy.  Bilateral gynecomastia. Heart is at the upper limits of normal in size.  No pericardial effusion. Probable small low attenuation pericardial cyst along the right heart border, measuring 1.3 x 2.6 cm, stable.  Coronary artery calcification.  Emphysema is predominantly paraseptal.  Suspect mild superimposed subpleural reticulation, basilar predominant.  Right lower lobe nodule measures 1.7 x 1.3 cm on image 38 (previously 2.1 x 1.9 cm). Mass-like soft tissue in the right infrahilar region measures 1.9 x  3.5 cm (previously 3.3 x 4.2 cm).  Additional scattered pulmonary parenchyma and pleural nodules in the lower lobes are unchanged in size.  A 9 mm extrapleural lymph node in the lateral right hemithorax (image 31) is unchanged.  IMPRESSION:  1.  Interval decrease in size of right infrahilar and right lower lobe areas of nodular or mass-like soft tissue.  Additional pulmonary nodules and pleural/extrapleural nodularity appear stable. 2.  Stable mediastinal and right hilar lymph nodes. 3.  Suspect mild subpleural fibrosis superimposed on paraseptal emphysema. 4.  Coronary artery calcification.   CT ABDOMEN AND PELVIS  Findings:  Liver, gallbladder and adrenal glands are unremarkable. There are several low attenuation lesions in the kidneys, measuring up to 5.1 cm on the left.  Statistically, these are likely cysts. Spleen, pancreas, stomach and bowel are unremarkable.  Prostate is mildly enlarged.  There are small bilateral inguinal hernias containing fat.  Atherosclerotic calcification of the arterial vasculature without abdominal aortic aneurysm.  No pathologically enlarged lymph nodes. No  free fluid.  No worrisome lytic or sclerotic lesions.  IMPRESSION:  No evidence of metastatic disease in the abdomen or pelvis.   Original Report Authenticated By: Leanna Battles, M.D.    ASSESSMENT/PLAN: This is a very pleasant 72 years old African American male with extensive stage small cell lung cancer currently on second line chemotherapy with cisplatin and irinotecan is status post 3 cycles. The patient is tolerating his treatment fairly well and he has some improvement in his disease on the recent CT scan of the chest, abdomen and pelvis. Patient was discussed with Dr. Arbutus Ped. His platelet count of 68,000 is subtherapeutic for him to proceed with chemotherapy as scheduled today. We will reschedule chemotherapy and repeat labs for next week. We will monitor the patient's urinary symptomatology. He may need at the very  least and urinalysis and urine culture and sensitivity and we will order the studies if indicated. He also may need to followup with his primary care physician regarding possible low-level prostatitis plus or minus referral to urologist. He'll followup in 2 weeks with repeat CBC differential, C. met magnesium for another symptom management visit.  [name], Francis Asman E, PA-C   All questions were answered. The patient knows to call the clinic with any problems, questions or concerns. We can certainly see the patient much sooner if necessary.  I spent 20 minutes counseling the patient face to face. The total time spent in the appointment was 30 minutes.

## 2012-07-09 ENCOUNTER — Other Ambulatory Visit: Payer: Self-pay | Admitting: Internal Medicine

## 2012-07-09 ENCOUNTER — Other Ambulatory Visit (HOSPITAL_BASED_OUTPATIENT_CLINIC_OR_DEPARTMENT_OTHER): Payer: Medicare Other

## 2012-07-09 ENCOUNTER — Other Ambulatory Visit: Payer: Medicare Other | Admitting: Lab

## 2012-07-09 ENCOUNTER — Ambulatory Visit (HOSPITAL_BASED_OUTPATIENT_CLINIC_OR_DEPARTMENT_OTHER): Payer: Medicare Other

## 2012-07-09 DIAGNOSIS — C3430 Malignant neoplasm of lower lobe, unspecified bronchus or lung: Secondary | ICD-10-CM

## 2012-07-09 DIAGNOSIS — C3490 Malignant neoplasm of unspecified part of unspecified bronchus or lung: Secondary | ICD-10-CM

## 2012-07-09 DIAGNOSIS — Z5111 Encounter for antineoplastic chemotherapy: Secondary | ICD-10-CM

## 2012-07-09 LAB — CBC WITH DIFFERENTIAL/PLATELET
BASO%: 0.3 % (ref 0.0–2.0)
Eosinophils Absolute: 0.1 10*3/uL (ref 0.0–0.5)
HCT: 37.5 % — ABNORMAL LOW (ref 38.4–49.9)
MCHC: 34.9 g/dL (ref 32.0–36.0)
MONO#: 0.4 10*3/uL (ref 0.1–0.9)
NEUT#: 1.7 10*3/uL (ref 1.5–6.5)
NEUT%: 48 % (ref 39.0–75.0)
RBC: 3.66 10*6/uL — ABNORMAL LOW (ref 4.20–5.82)
WBC: 3.5 10*3/uL — ABNORMAL LOW (ref 4.0–10.3)
lymph#: 1.4 10*3/uL (ref 0.9–3.3)
nRBC: 0 % (ref 0–0)

## 2012-07-09 LAB — COMPREHENSIVE METABOLIC PANEL (CC13)
ALT: 18 U/L (ref 0–55)
AST: 22 U/L (ref 5–34)
Albumin: 3.4 g/dL — ABNORMAL LOW (ref 3.5–5.0)
CO2: 26 mEq/L (ref 22–29)
Calcium: 10.4 mg/dL (ref 8.4–10.4)
Chloride: 102 mEq/L (ref 98–107)
Potassium: 4.3 mEq/L (ref 3.5–5.1)

## 2012-07-09 MED ORDER — DEXAMETHASONE SODIUM PHOSPHATE 4 MG/ML IJ SOLN
12.0000 mg | Freq: Once | INTRAMUSCULAR | Status: AC
  Administered 2012-07-09: 12 mg via INTRAVENOUS

## 2012-07-09 MED ORDER — DEXTROSE 5 % IV SOLN
160.0000 mg | Freq: Once | INTRAVENOUS | Status: AC
  Administered 2012-07-09: 160 mg via INTRAVENOUS
  Filled 2012-07-09: qty 8

## 2012-07-09 MED ORDER — SODIUM CHLORIDE 0.9 % IV SOLN
Freq: Once | INTRAVENOUS | Status: AC
  Administered 2012-07-09: 09:00:00 via INTRAVENOUS

## 2012-07-09 MED ORDER — PALONOSETRON HCL INJECTION 0.25 MG/5ML
0.2500 mg | Freq: Once | INTRAVENOUS | Status: AC
  Administered 2012-07-09: 0.25 mg via INTRAVENOUS

## 2012-07-09 MED ORDER — POTASSIUM CHLORIDE 2 MEQ/ML IV SOLN
Freq: Once | INTRAVENOUS | Status: AC
  Administered 2012-07-09: 10:00:00 via INTRAVENOUS
  Filled 2012-07-09: qty 10

## 2012-07-09 MED ORDER — FOSAPREPITANT DIMEGLUMINE INJECTION 150 MG
150.0000 mg | Freq: Once | INTRAVENOUS | Status: AC
  Administered 2012-07-09: 150 mg via INTRAVENOUS
  Filled 2012-07-09: qty 5

## 2012-07-09 MED ORDER — SODIUM CHLORIDE 0.9 % IV SOLN
30.0000 mg/m2 | Freq: Once | INTRAVENOUS | Status: AC
  Administered 2012-07-09: 73 mg via INTRAVENOUS
  Filled 2012-07-09: qty 73

## 2012-07-09 MED ORDER — ATROPINE SULFATE 0.4 MG/ML IJ SOLN
0.5000 mg | Freq: Once | INTRAMUSCULAR | Status: DC | PRN
  Filled 2012-07-09: qty 1.25

## 2012-07-09 NOTE — Patient Instructions (Signed)
[facility] Discharge Instructions for Patients Receiving Chemotherapy  Today you received the following chemotherapy agents Cisplatin/Irinotecan To help prevent nausea and vomiting after your treatment, we encourage you to take your nausea medication as prescribed.  If you develop nausea and vomiting that is not controlled by your nausea medication, call the clinic. If it is after clinic hours your family physician or the after hours number for the clinic or go to the Emergency Department.   BELOW ARE SYMPTOMS THAT SHOULD BE REPORTED IMMEDIATELY:  *FEVER GREATER THAN 100.5 F  *CHILLS WITH OR WITHOUT FEVER  NAUSEA AND VOMITING THAT IS NOT CONTROLLED WITH YOUR NAUSEA MEDICATION  *UNUSUAL SHORTNESS OF BREATH  *UNUSUAL BRUISING OR BLEEDING  TENDERNESS IN MOUTH AND THROAT WITH OR WITHOUT PRESENCE OF ULCERS  *URINARY PROBLEMS  *BOWEL PROBLEMS  UNUSUAL RASH Items with * indicate a potential emergency and should be followed up as soon as possible.  One of the nurses will contact you 24 hours after your treatment. Please let the nurse know about any problems that you may have experienced. Feel free to call the clinic you have any questions or concerns. The clinic phone number is [phone].   I have been informed and understand all the instructions given to me. I know to contact the clinic, my physician, or go to the Emergency Department if any problems should occur. I do not have any questions at this time, but understand that I may call the clinic during office hours   should I have any questions or need assistance in obtaining follow up care.    __________________________________________  _____________  __________ Signature of Patient or Authorized Representative            Date                   Time    __________________________________________ Nurse's Signature

## 2012-07-16 ENCOUNTER — Other Ambulatory Visit: Payer: Medicare Other | Admitting: Lab

## 2012-07-17 ENCOUNTER — Ambulatory Visit (HOSPITAL_BASED_OUTPATIENT_CLINIC_OR_DEPARTMENT_OTHER): Payer: Medicare Other

## 2012-07-17 ENCOUNTER — Ambulatory Visit (HOSPITAL_BASED_OUTPATIENT_CLINIC_OR_DEPARTMENT_OTHER): Payer: Medicare Other | Admitting: Physician Assistant

## 2012-07-17 ENCOUNTER — Encounter: Payer: Self-pay | Admitting: Physician Assistant

## 2012-07-17 ENCOUNTER — Telehealth: Payer: Self-pay | Admitting: *Deleted

## 2012-07-17 ENCOUNTER — Telehealth: Payer: Self-pay | Admitting: Internal Medicine

## 2012-07-17 ENCOUNTER — Other Ambulatory Visit (HOSPITAL_BASED_OUTPATIENT_CLINIC_OR_DEPARTMENT_OTHER): Payer: Medicare Other

## 2012-07-17 VITALS — BP 132/80 | HR 86 | Temp 97.8°F | Resp 20 | Ht 73.0 in | Wt 240.6 lb

## 2012-07-17 DIAGNOSIS — C3430 Malignant neoplasm of lower lobe, unspecified bronchus or lung: Secondary | ICD-10-CM

## 2012-07-17 DIAGNOSIS — C3490 Malignant neoplasm of unspecified part of unspecified bronchus or lung: Secondary | ICD-10-CM

## 2012-07-17 DIAGNOSIS — Z5111 Encounter for antineoplastic chemotherapy: Secondary | ICD-10-CM

## 2012-07-17 LAB — COMPREHENSIVE METABOLIC PANEL (CC13)
ALT: 19 U/L (ref 0–55)
AST: 20 U/L (ref 5–34)
Albumin: 3.4 g/dL — ABNORMAL LOW (ref 3.5–5.0)
Alkaline Phosphatase: 103 U/L (ref 40–150)
BUN: 20.9 mg/dL (ref 7.0–26.0)
CO2: 25 mEq/L (ref 22–29)
Calcium: 10.4 mg/dL (ref 8.4–10.4)
Chloride: 103 mEq/L (ref 98–107)
Creatinine: 1.4 mg/dL — ABNORMAL HIGH (ref 0.7–1.3)
Glucose: 109 mg/dl — ABNORMAL HIGH (ref 70–99)
Potassium: 4 mEq/L (ref 3.5–5.1)
Sodium: 138 mEq/L (ref 136–145)
Total Bilirubin: 0.31 mg/dL (ref 0.20–1.20)
Total Protein: 7.2 g/dL (ref 6.4–8.3)

## 2012-07-17 LAB — CBC WITH DIFFERENTIAL/PLATELET
BASO%: 0.3 % (ref 0.0–2.0)
Basophils Absolute: 0 10*3/uL (ref 0.0–0.1)
EOS%: 1.3 % (ref 0.0–7.0)
Eosinophils Absolute: 0 10*3/uL (ref 0.0–0.5)
HCT: 35.8 % — ABNORMAL LOW (ref 38.4–49.9)
HGB: 12.6 g/dL — ABNORMAL LOW (ref 13.0–17.1)
LYMPH%: 42.9 % (ref 14.0–49.0)
MCH: 35.8 pg — ABNORMAL HIGH (ref 27.2–33.4)
MCHC: 35.2 g/dL (ref 32.0–36.0)
MCV: 101.7 fL — ABNORMAL HIGH (ref 79.3–98.0)
MONO#: 0.3 10*3/uL (ref 0.1–0.9)
MONO%: 9.4 % (ref 0.0–14.0)
NEUT#: 1.4 10*3/uL — ABNORMAL LOW (ref 1.5–6.5)
NEUT%: 46.1 % (ref 39.0–75.0)
Platelets: 135 10*3/uL — ABNORMAL LOW (ref 140–400)
RBC: 3.52 10*6/uL — ABNORMAL LOW (ref 4.20–5.82)
RDW: 14 % (ref 11.0–14.6)
WBC: 3.1 10*3/uL — ABNORMAL LOW (ref 4.0–10.3)
lymph#: 1.3 10*3/uL (ref 0.9–3.3)

## 2012-07-17 MED ORDER — SODIUM CHLORIDE 0.9 % IV SOLN
Freq: Once | INTRAVENOUS | Status: AC
  Administered 2012-07-17: 13:00:00 via INTRAVENOUS

## 2012-07-17 MED ORDER — IRINOTECAN HCL CHEMO INJECTION 100 MG/5ML
66.0000 mg/m2 | Freq: Once | INTRAVENOUS | Status: AC
  Administered 2012-07-17: 160 mg via INTRAVENOUS
  Filled 2012-07-17: qty 8

## 2012-07-17 MED ORDER — SODIUM CHLORIDE 0.9 % IV SOLN
150.0000 mg | Freq: Once | INTRAVENOUS | Status: AC
  Administered 2012-07-17: 150 mg via INTRAVENOUS
  Filled 2012-07-17: qty 5

## 2012-07-17 MED ORDER — SODIUM CHLORIDE 0.9 % IV SOLN
30.0000 mg/m2 | Freq: Once | INTRAVENOUS | Status: AC
  Administered 2012-07-17: 73 mg via INTRAVENOUS
  Filled 2012-07-17: qty 73

## 2012-07-17 MED ORDER — DEXAMETHASONE SODIUM PHOSPHATE 4 MG/ML IJ SOLN
12.0000 mg | Freq: Once | INTRAMUSCULAR | Status: AC
  Administered 2012-07-17: 12 mg via INTRAVENOUS

## 2012-07-17 MED ORDER — ATROPINE SULFATE 0.4 MG/ML IJ SOLN
0.5000 mg | Freq: Once | INTRAMUSCULAR | Status: AC | PRN
  Filled 2012-07-17: qty 1.25

## 2012-07-17 MED ORDER — PALONOSETRON HCL INJECTION 0.25 MG/5ML
0.2500 mg | Freq: Once | INTRAVENOUS | Status: AC
  Administered 2012-07-17: 0.25 mg via INTRAVENOUS

## 2012-07-17 MED ORDER — POTASSIUM CHLORIDE 2 MEQ/ML IV SOLN
Freq: Once | INTRAVENOUS | Status: AC
  Administered 2012-07-17: 11:00:00 via INTRAVENOUS
  Filled 2012-07-17: qty 10

## 2012-07-17 NOTE — Telephone Encounter (Signed)
Per staff message and POF I have scheduled appts.  JMW  

## 2012-07-17 NOTE — Patient Instructions (Addendum)
Continue labs and chemotherapy as scheduled Follow up in 2 weeks prior to your next cycle of chemotherapy

## 2012-07-17 NOTE — Progress Notes (Unsigned)
Per Dimas Alexandria it is okay to treat patient today with chemotherapy and ANC of 1.4.

## 2012-07-17 NOTE — Patient Instructions (Addendum)
[facility] Discharge Instructions for Patients Receiving Chemotherapy  Today you received the following chemotherapy agents Cisplatin and Camptosar.  To help prevent nausea and vomiting after your treatment, we encourage you to take your nausea medication as prescribed. Begin taking it as directed and take it as often as prescribed for the next 48-72 hours.   If you develop nausea and vomiting that is not controlled by your nausea medication, call the clinic. If it is after clinic hours your family physician or the after hours number for the clinic or go to the Emergency Department.   BELOW ARE SYMPTOMS THAT SHOULD BE REPORTED IMMEDIATELY:  *FEVER GREATER THAN 100.5 F  *CHILLS WITH OR WITHOUT FEVER  NAUSEA AND VOMITING THAT IS NOT CONTROLLED WITH YOUR NAUSEA MEDICATION  *UNUSUAL SHORTNESS OF BREATH  *UNUSUAL BRUISING OR BLEEDING  TENDERNESS IN MOUTH AND THROAT WITH OR WITHOUT PRESENCE OF ULCERS  *URINARY PROBLEMS  *BOWEL PROBLEMS  UNUSUAL RASH Items with * indicate a potential emergency and should be followed up as soon as possible.  One of the nurses will contact you 24 hours after your treatment. Please let the nurse know about any problems that you may have experienced. Feel free to call the clinic you have any questions or concerns. The clinic phone number is [phone].   I have been informed and understand all the instructions given to me. I know to contact the clinic, my physician, or go to the Emergency Department if any problems should occur. I do not have any questions at this time, but understand that I may call the clinic during office hours   should I have any questions or need assistance in obtaining follow up care.    __________________________________________  _____________  __________ Signature of Patient or Authorized Representative            Date                   Time    __________________________________________ Nurse's  Signature

## 2012-07-19 NOTE — Progress Notes (Signed)
[name] Telephone:([phone]   Fax:(336) [phone]  OFFICE PROGRESS NOTE  Francis Inch, MD [phone] [place] [address]  DIAGNOSIS: Extensive stage small cell lung cancer diagnosed in August of 2013.   PRIOR THERAPY: Systemic chemotherapy in the form of carboplatin for an AUC of 5 given on day 1 and etoposide 100 mg per meter squared given on days 1, 2 and 3 with Neulasta support given on day 4. Status post 2 cycle, discontinued today secondary to disease progression   CURRENT THERAPY: Systemic chemotherapy with cisplatin 30 mg/M2 and irinotecan 65 mg/M2 on days 1 and 8 every 3 weeks. Status post 4 cycles and day 1 of cycle 5.   INTERVAL HISTORY: [name] 72 y.o. male returns to the clinic today for followup visit. The patient tolerated the last cycle of his systemic chemotherapy with cisplatin and irinotecan fairly well. He denied having any significant nausea or vomiting, no fever or chills. He denied having any significant chest pain, shortness breath, cough or hemoptysis. He denies any problems with bleeding or bruising. He reports decreased swelling in his right ankle as well as improved stability. He denied any ffurther urinary symptoms.  MEDICAL HISTORY: Past Medical History  Diagnosis Date  . Hypertension   . ALLERGIC RHINITIS   . Malignant neoplasm of bronchus and lung, unspecified site 01/23/2012    ALLERGIES:   has no known allergies.  MEDICATIONS:  Current Outpatient Prescriptions  Medication Sig Dispense Refill  . aspirin EC 325 MG tablet Take 1 tablet (325 mg total) by mouth 2 (two) times daily.  30 tablet  0  . folic acid (FOLVITE) 1 MG tablet Take 1 tablet (1 mg total) by mouth daily.  30 tablet  1  . olmesartan-hydrochlorothiazide (BENICAR HCT) 40-12.5 MG per tablet Take 0.5 tablets by mouth daily.       Marland Kitchen oxyCODONE (OXY IR/ROXICODONE) 5 MG immediate release tablet Take 1 tablet (5 mg total) by mouth every 6 (six) hours as  needed.  90 tablet  0  . vitamin B-12 (CYANOCOBALAMIN) 1000 MCG tablet Take 1,000 mcg by mouth daily.        SURGICAL HISTORY:  Past Surgical History  Procedure Date  . Wrist surgery 1961    right wrist  . Orif ankle fracture 05/07/2012    Procedure: OPEN REDUCTION INTERNAL FIXATION (ORIF) ANKLE FRACTURE;  Surgeon: Mable Paris, MD;  Location: [facility];  Service: Orthopedics;  Laterality: Right;    REVIEW OF SYSTEMS:  Pertinent items are noted in HPI.   PHYSICAL EXAMINATION: General appearance: alert, cooperative, fatigued and no distress Head: Normocephalic, without obvious abnormality, atraumatic Neck: no adenopathy Lymph nodes: Cervical, supraclavicular, and axillary nodes normal. Resp: clear to auscultation bilaterally Cardio: regular rate and rhythm, S1, S2 normal, no murmur, click, rub or gallop GI: soft, non-tender; bowel sounds normal; no masses,  no organomegaly Extremities: extremities normal, atraumatic, no cyanosis or edema Neurologic: Alert and oriented X 3, normal strength and tone. Normal symmetric reflexes. Normal coordination and gait  ECOG PERFORMANCE STATUS: 1 - Symptomatic but completely ambulatory  Blood pressure 132/80, pulse 86, temperature 97.8 F (36.6 C), temperature source Oral, resp. rate 20, height 6\' 1"  (1.854 m), weight 240 lb 9.6 oz (109.135 kg).  LABORATORY DATA: Lab Results  Component Value Date   WBC 3.1* 07/17/2012   HGB 12.6* 07/17/2012   HCT 35.8* 07/17/2012   MCV 101.7* 07/17/2012   PLT 135* 07/17/2012  Chemistry      Component Value Date/Time   NA 138 07/17/2012 0905   NA 134* 06/23/2012 0228   K 4.0 07/17/2012 0905   K 4.1 06/23/2012 0228   CL 103 07/17/2012 0905   CL 100 06/23/2012 0228   CO2 25 07/17/2012 0905   CO2 27 06/23/2012 0228   BUN 20.9 07/17/2012 0905   BUN 20 06/23/2012 0228   CREATININE 1.4* 07/17/2012 0905   CREATININE 1.27 06/23/2012 0228      Component Value Date/Time   CALCIUM 10.4 07/17/2012 0905   CALCIUM 10.0  06/23/2012 0228   ALKPHOS 103 07/17/2012 0905   ALKPHOS 57 05/11/2012 0625   AST 20 07/17/2012 0905   AST 15 05/11/2012 0625   ALT 19 07/17/2012 0905   ALT 11 05/11/2012 0625   BILITOT 0.31 07/17/2012 0905   BILITOT 0.4 05/11/2012 0625       RADIOGRAPHIC STUDIES: Ct Chest W Contrast  06/08/2012  *RADIOLOGY REPORT*  Clinical Data:  Follow-up lung cancer.  Ongoing chemotherapy.  CT CHEST, ABDOMEN AND PELVIS WITH CONTRAST  Technique:  Multidetector CT imaging of the chest, abdomen and pelvis was performed following the standard protocol during bolus administration of intravenous contrast.  Contrast: OMNIPAQUE IOHEXOL 300 MG/ML  SOLN  Comparison:  03/09/2012.  PET 01/27/2012.   CT CHEST  Findings:  Mediastinal lymph nodes measure up to 12 mm anterior to the right mainstem bronchus, as before.  Right hilar lymph node measures 7 mm.  No axillary adenopathy.  Bilateral gynecomastia. Heart is at the upper limits of normal in size.  No pericardial effusion. Probable small low attenuation pericardial cyst along the right heart border, measuring 1.3 x 2.6 cm, stable.  Coronary artery calcification.  Emphysema is predominantly paraseptal.  Suspect mild superimposed subpleural reticulation, basilar predominant.  Right lower lobe nodule measures 1.7 x 1.3 cm on image 38 (previously 2.1 x 1.9 cm). Mass-like soft tissue in the right infrahilar region measures 1.9 x 3.5 cm (previously 3.3 x 4.2 cm).  Additional scattered pulmonary parenchyma and pleural nodules in the lower lobes are unchanged in size.  A 9 mm extrapleural lymph node in the lateral right hemithorax (image 31) is unchanged.  IMPRESSION:  1.  Interval decrease in size of right infrahilar and right lower lobe areas of nodular or mass-like soft tissue.  Additional pulmonary nodules and pleural/extrapleural nodularity appear stable. 2.  Stable mediastinal and right hilar lymph nodes. 3.  Suspect mild subpleural fibrosis superimposed on paraseptal emphysema.  4.  Coronary artery calcification.   CT ABDOMEN AND PELVIS  Findings:  Liver, gallbladder and adrenal glands are unremarkable. There are several low attenuation lesions in the kidneys, measuring up to 5.1 cm on the left.  Statistically, these are likely cysts. Spleen, pancreas, stomach and bowel are unremarkable.  Prostate is mildly enlarged.  There are small bilateral inguinal hernias containing fat.  Atherosclerotic calcification of the arterial vasculature without abdominal aortic aneurysm.  No pathologically enlarged lymph nodes. No free fluid.  No worrisome lytic or sclerotic lesions.  IMPRESSION:  No evidence of metastatic disease in the abdomen or pelvis.   Original Report Authenticated By: Leanna Battles, M.D.    ASSESSMENT/PLAN: This is a very pleasant 72 years old African American male with extensive stage small cell lung cancer currently on second line chemotherapy with cisplatin and irinotecan is status post 4 cycles. The patient is tolerating his treatment fairly well and he has some improvement in his disease on the recent  CT scan of the chest, abdomen and pelvis. Patient was discussed with Dr. Arbutus Ped.  His ANC is 1.4 today and per Dr. Arbutus Ped he will proceed with cyclle #5, day 8 today as scheduled. He'll followup in 2 weeks with repeat CBC differential, C. met magnesium for another symptom management visit.  [name], Francis Shepheard E, PA-C   All questions were answered. The patient knows to call the clinic with any problems, questions or concerns. We can certainly see the patient much sooner if necessary.  I spent 20 minutes counseling the patient face to face. The total time spent in the appointment was 30 minutes.

## 2012-07-23 ENCOUNTER — Other Ambulatory Visit: Payer: Medicare Other

## 2012-07-24 ENCOUNTER — Ambulatory Visit (HOSPITAL_BASED_OUTPATIENT_CLINIC_OR_DEPARTMENT_OTHER): Payer: Medicare Other | Admitting: Lab

## 2012-07-24 DIAGNOSIS — C3430 Malignant neoplasm of lower lobe, unspecified bronchus or lung: Secondary | ICD-10-CM

## 2012-07-24 DIAGNOSIS — C3490 Malignant neoplasm of unspecified part of unspecified bronchus or lung: Secondary | ICD-10-CM

## 2012-07-24 LAB — CBC WITH DIFFERENTIAL/PLATELET
Basophils Absolute: 0 10*3/uL (ref 0.0–0.1)
Eosinophils Absolute: 0 10*3/uL (ref 0.0–0.5)
LYMPH%: 44.2 % (ref 14.0–49.0)
MCV: 103.4 fL — ABNORMAL HIGH (ref 79.3–98.0)
MONO%: 6.2 % (ref 0.0–14.0)
NEUT#: 1.8 10*3/uL (ref 1.5–6.5)
Platelets: 109 10*3/uL — ABNORMAL LOW (ref 140–400)
RBC: 3.47 10*6/uL — ABNORMAL LOW (ref 4.20–5.82)

## 2012-07-24 LAB — COMPREHENSIVE METABOLIC PANEL (CC13)
Alkaline Phosphatase: 95 U/L (ref 40–150)
BUN: 37.5 mg/dL — ABNORMAL HIGH (ref 7.0–26.0)
Creatinine: 2.2 mg/dL — ABNORMAL HIGH (ref 0.7–1.3)
Glucose: 135 mg/dl — ABNORMAL HIGH (ref 70–99)
Sodium: 139 mEq/L (ref 136–145)
Total Bilirubin: 0.37 mg/dL (ref 0.20–1.20)

## 2012-07-30 ENCOUNTER — Other Ambulatory Visit: Payer: Self-pay | Admitting: Medical Oncology

## 2012-07-30 DIAGNOSIS — C3490 Malignant neoplasm of unspecified part of unspecified bronchus or lung: Secondary | ICD-10-CM

## 2012-07-31 ENCOUNTER — Encounter: Payer: Self-pay | Admitting: Internal Medicine

## 2012-07-31 ENCOUNTER — Other Ambulatory Visit: Payer: 59

## 2012-07-31 ENCOUNTER — Telehealth: Payer: Self-pay | Admitting: Internal Medicine

## 2012-07-31 ENCOUNTER — Ambulatory Visit (HOSPITAL_BASED_OUTPATIENT_CLINIC_OR_DEPARTMENT_OTHER): Payer: Medicare Other | Admitting: Internal Medicine

## 2012-07-31 ENCOUNTER — Ambulatory Visit: Payer: 59

## 2012-07-31 ENCOUNTER — Other Ambulatory Visit (HOSPITAL_BASED_OUTPATIENT_CLINIC_OR_DEPARTMENT_OTHER): Payer: Medicare Other

## 2012-07-31 VITALS — BP 149/89 | HR 92 | Temp 98.5°F | Resp 20 | Ht 73.0 in | Wt 244.2 lb

## 2012-07-31 DIAGNOSIS — C3490 Malignant neoplasm of unspecified part of unspecified bronchus or lung: Secondary | ICD-10-CM

## 2012-07-31 DIAGNOSIS — D696 Thrombocytopenia, unspecified: Secondary | ICD-10-CM

## 2012-07-31 DIAGNOSIS — C3430 Malignant neoplasm of lower lobe, unspecified bronchus or lung: Secondary | ICD-10-CM

## 2012-07-31 LAB — CBC WITH DIFFERENTIAL/PLATELET
BASO%: 0.3 % (ref 0.0–2.0)
MCHC: 34.7 g/dL (ref 32.0–36.0)
MONO#: 0.3 10*3/uL (ref 0.1–0.9)
RBC: 3.12 10*6/uL — ABNORMAL LOW (ref 4.20–5.82)
WBC: 3 10*3/uL — ABNORMAL LOW (ref 4.0–10.3)
lymph#: 1.2 10*3/uL (ref 0.9–3.3)
nRBC: 0 % (ref 0–0)

## 2012-07-31 NOTE — Progress Notes (Signed)
[facility] [phone]   Fax:([phone]  OFFICE PROGRESS NOTE  [name], MD [phone] [place] [address]  DIAGNOSIS: Extensive stage small cell lung cancer diagnosed in August of 2013.   PRIOR THERAPY: Systemic chemotherapy in the form of carboplatin for an AUC of 5 given on day 1 and etoposide 100 mg per meter squared given on days 1, 2 and 3 with Neulasta support given on day 4. Status post 2 cycle, discontinued today secondary to disease progression   CURRENT THERAPY: Systemic chemotherapy with cisplatin 30 mg/M2 and irinotecan 65 mg/M2 on days 1 and 8 every 3 weeks. Status post 5 cycles.   INTERVAL HISTORY: [name] 72 y.o. male returns to the clinic today for followup visit. The patient is feeling fine today with no specific complaints. He denied having any significant chest pain, shortness breath, cough or hemoptysis. He denied having any significant weight loss or night sweats. He tolerated the last cycle of his systemic chemotherapy fairly well with no significant adverse effects.  MEDICAL HISTORY: Past Medical History  Diagnosis Date  . Hypertension   . ALLERGIC RHINITIS   . Malignant neoplasm of bronchus and lung, unspecified site 01/23/2012    ALLERGIES:  has No Known Allergies.  MEDICATIONS:  Current Outpatient Prescriptions  Medication Sig Dispense Refill  . aspirin EC 325 MG tablet Take 1 tablet (325 mg total) by mouth 2 (two) times daily.  30 tablet  0  . folic acid (FOLVITE) 1 MG tablet Take 1 tablet (1 mg total) by mouth daily.  30 tablet  1  . olmesartan-hydrochlorothiazide (BENICAR HCT) 40-12.5 MG per tablet Take 0.5 tablets by mouth daily.       Marland Kitchen oxyCODONE (OXY IR/ROXICODONE) 5 MG immediate release tablet Take 1 tablet (5 mg total) by mouth every 6 (six) hours as needed.  90 tablet  0  . vitamin B-12 (CYANOCOBALAMIN) 1000 MCG tablet Take 1,000 mcg by mouth daily.       No current facility-administered  medications for this visit.    SURGICAL HISTORY:  Past Surgical History  Procedure Laterality Date  . Wrist surgery  1961    right wrist  . Orif ankle fracture  05/07/2012    Procedure: OPEN REDUCTION INTERNAL FIXATION (ORIF) ANKLE FRACTURE;  Surgeon: Mable Paris, MD;  Location: [facility];  Service: Orthopedics;  Laterality: Right;    REVIEW OF SYSTEMS:  A comprehensive review of systems was negative except for: Constitutional: positive for fatigue   PHYSICAL EXAMINATION: General appearance: alert, cooperative and no distress Head: Normocephalic, without obvious abnormality, atraumatic Neck: no adenopathy Lymph nodes: Cervical, supraclavicular, and axillary nodes normal. Resp: clear to auscultation bilaterally Cardio: regular rate and rhythm, S1, S2 normal, no murmur, click, rub or gallop GI: soft, non-tender; bowel sounds normal; no masses,  no organomegaly Extremities: extremities normal, atraumatic, no cyanosis or edema Neurologic: Alert and oriented X 3, normal strength and tone. Normal symmetric reflexes. Normal coordination and gait  ECOG PERFORMANCE STATUS: 1 - Symptomatic but completely ambulatory  There were no vitals taken for this visit.  LABORATORY DATA: Lab Results  Component Value Date   WBC 3.0* 07/31/2012   HGB 11.0* 07/31/2012   HCT 31.7* 07/31/2012   MCV 101.6* 07/31/2012   PLT 56* 07/31/2012      Chemistry      Component Value Date/Time   NA 139 07/24/2012 1222   NA 134* 06/23/2012 0228   K  4.2 07/24/2012 1222   K 4.1 06/23/2012 0228   CL 102 07/24/2012 1222   CL 100 06/23/2012 0228   CO2 28 07/24/2012 1222   CO2 27 06/23/2012 0228   BUN 37.5* 07/24/2012 1222   BUN 20 06/23/2012 0228   CREATININE 2.2* 07/24/2012 1222   CREATININE 1.27 06/23/2012 0228      Component Value Date/Time   CALCIUM 10.2 07/24/2012 1222   CALCIUM 10.0 06/23/2012 0228   ALKPHOS 95 07/24/2012 1222   ALKPHOS 57 05/11/2012 0625   AST 16 07/24/2012 1222   AST 15 05/11/2012 0625    ALT 15 07/24/2012 1222   ALT 11 05/11/2012 0625   BILITOT 0.37 07/24/2012 1222   BILITOT 0.4 05/11/2012 0625       RADIOGRAPHIC STUDIES: No results found.  ASSESSMENT: This is a very pleasant 72 years old [place] American male with extensive stage small cell lung cancer currently undergoing systemic chemotherapy with cisplatin and irinotecan status post 5 cycles. The patient is tolerating his treatment fairly well except for thrombocytopenia today.   PLAN: I recommended for him to delay the start of cycle #6 to next week until improvement in his thrombocytopenia. I would see the patient back for followup visit in one month with repeat CT scan of the chest, abdomen and pelvis for restaging of his disease. He was advised to call me immediately if he has any concerning symptoms in the interval.  All questions were answered. The patient knows to call the clinic with any problems, questions or concerns. We can certainly see the patient much sooner if necessary.  I spent 15 minutes counseling the patient face to face. The total time spent in the appointment was 25 minutes.

## 2012-07-31 NOTE — Patient Instructions (Signed)
We will delay chemotherapy next week because of low platelets count. Followup in one month with repeat CT scan of the chest, abdomen and pelvis.

## 2012-08-03 ENCOUNTER — Telehealth: Payer: Self-pay | Admitting: *Deleted

## 2012-08-03 NOTE — Telephone Encounter (Signed)
Per staff message and POF I have scheduled appts.  JMW  

## 2012-08-07 ENCOUNTER — Other Ambulatory Visit (HOSPITAL_BASED_OUTPATIENT_CLINIC_OR_DEPARTMENT_OTHER): Payer: Medicare Other | Admitting: Lab

## 2012-08-07 ENCOUNTER — Ambulatory Visit: Payer: Medicare Other

## 2012-08-07 DIAGNOSIS — N179 Acute kidney failure, unspecified: Secondary | ICD-10-CM

## 2012-08-07 DIAGNOSIS — R918 Other nonspecific abnormal finding of lung field: Secondary | ICD-10-CM

## 2012-08-07 DIAGNOSIS — R222 Localized swelling, mass and lump, trunk: Secondary | ICD-10-CM

## 2012-08-07 LAB — COMPREHENSIVE METABOLIC PANEL (CC13)
ALT: 14 U/L (ref 0–55)
AST: 17 U/L (ref 5–34)
Albumin: 3.2 g/dL — ABNORMAL LOW (ref 3.5–5.0)
Calcium: 10.3 mg/dL (ref 8.4–10.4)
Chloride: 106 mEq/L (ref 98–107)
Creatinine: 2 mg/dL — ABNORMAL HIGH (ref 0.7–1.3)
Potassium: 4.4 mEq/L (ref 3.5–5.1)

## 2012-08-07 LAB — CBC WITH DIFFERENTIAL/PLATELET
BASO%: 0 % (ref 0.0–2.0)
EOS%: 1.1 % (ref 0.0–7.0)
HCT: 30.6 % — ABNORMAL LOW (ref 38.4–49.9)
MCH: 35.6 pg — ABNORMAL HIGH (ref 27.2–33.4)
MCHC: 35.3 g/dL (ref 32.0–36.0)
MCV: 101 fL — ABNORMAL HIGH (ref 79.3–98.0)
MONO%: 7.5 % (ref 0.0–14.0)
NEUT%: 47.6 % (ref 39.0–75.0)
lymph#: 1.2 10*3/uL (ref 0.9–3.3)

## 2012-08-13 ENCOUNTER — Encounter: Payer: Self-pay | Admitting: Pharmacist

## 2012-08-14 ENCOUNTER — Other Ambulatory Visit: Payer: 59

## 2012-08-14 ENCOUNTER — Other Ambulatory Visit: Payer: Self-pay | Admitting: Medical Oncology

## 2012-08-14 ENCOUNTER — Other Ambulatory Visit (HOSPITAL_BASED_OUTPATIENT_CLINIC_OR_DEPARTMENT_OTHER): Payer: Medicare Other | Admitting: Lab

## 2012-08-14 ENCOUNTER — Ambulatory Visit (HOSPITAL_BASED_OUTPATIENT_CLINIC_OR_DEPARTMENT_OTHER): Payer: Medicare Other

## 2012-08-14 ENCOUNTER — Other Ambulatory Visit: Payer: Self-pay | Admitting: Internal Medicine

## 2012-08-14 VITALS — BP 130/87 | HR 93 | Temp 97.4°F | Resp 18

## 2012-08-14 DIAGNOSIS — C3490 Malignant neoplasm of unspecified part of unspecified bronchus or lung: Secondary | ICD-10-CM

## 2012-08-14 DIAGNOSIS — C3430 Malignant neoplasm of lower lobe, unspecified bronchus or lung: Secondary | ICD-10-CM

## 2012-08-14 DIAGNOSIS — Z5111 Encounter for antineoplastic chemotherapy: Secondary | ICD-10-CM

## 2012-08-14 DIAGNOSIS — C3491 Malignant neoplasm of unspecified part of right bronchus or lung: Secondary | ICD-10-CM

## 2012-08-14 LAB — COMPREHENSIVE METABOLIC PANEL (CC13)
ALT: 14 U/L (ref 0–55)
AST: 17 U/L (ref 5–34)
Alkaline Phosphatase: 91 U/L (ref 40–150)
Calcium: 10.1 mg/dL (ref 8.4–10.4)
Chloride: 104 mEq/L (ref 98–107)
Creatinine: 1.6 mg/dL — ABNORMAL HIGH (ref 0.7–1.3)
Total Bilirubin: 0.29 mg/dL (ref 0.20–1.20)

## 2012-08-14 LAB — CBC WITH DIFFERENTIAL/PLATELET
BASO%: 0.6 % (ref 0.0–2.0)
Basophils Absolute: 0 10*3/uL (ref 0.0–0.1)
EOS%: 1.3 % (ref 0.0–7.0)
HCT: 31.8 % — ABNORMAL LOW (ref 38.4–49.9)
LYMPH%: 38.2 % (ref 14.0–49.0)
MCH: 35.9 pg — ABNORMAL HIGH (ref 27.2–33.4)
MCHC: 35.5 g/dL (ref 32.0–36.0)
MCV: 101 fL — ABNORMAL HIGH (ref 79.3–98.0)
MONO%: 9.1 % (ref 0.0–14.0)
NEUT%: 50.8 % (ref 39.0–75.0)
Platelets: 107 10*3/uL — ABNORMAL LOW (ref 140–400)

## 2012-08-14 MED ORDER — SODIUM CHLORIDE 0.9 % IV SOLN
30.0000 mg/m2 | Freq: Once | INTRAVENOUS | Status: AC
  Administered 2012-08-14: 73 mg via INTRAVENOUS
  Filled 2012-08-14: qty 73

## 2012-08-14 MED ORDER — IRINOTECAN HCL CHEMO INJECTION 100 MG/5ML
160.0000 mg | Freq: Once | INTRAVENOUS | Status: AC
  Administered 2012-08-14: 160 mg via INTRAVENOUS
  Filled 2012-08-14: qty 8

## 2012-08-14 MED ORDER — SODIUM CHLORIDE 0.9 % IV SOLN
150.0000 mg | Freq: Once | INTRAVENOUS | Status: AC
  Administered 2012-08-14: 150 mg via INTRAVENOUS
  Filled 2012-08-14: qty 5

## 2012-08-14 MED ORDER — DEXAMETHASONE SODIUM PHOSPHATE 4 MG/ML IJ SOLN
12.0000 mg | Freq: Once | INTRAMUSCULAR | Status: AC
  Administered 2012-08-14: 12 mg via INTRAVENOUS

## 2012-08-14 MED ORDER — SODIUM CHLORIDE 0.9 % IV SOLN
Freq: Once | INTRAVENOUS | Status: AC
  Administered 2012-08-14: 10:00:00 via INTRAVENOUS

## 2012-08-14 MED ORDER — POTASSIUM CHLORIDE 2 MEQ/ML IV SOLN
Freq: Once | INTRAVENOUS | Status: AC
  Administered 2012-08-14: 11:00:00 via INTRAVENOUS
  Filled 2012-08-14: qty 10

## 2012-08-14 MED ORDER — PALONOSETRON HCL INJECTION 0.25 MG/5ML
0.2500 mg | Freq: Once | INTRAVENOUS | Status: AC
  Administered 2012-08-14: 0.25 mg via INTRAVENOUS

## 2012-08-14 MED ORDER — ATROPINE SULFATE 1 MG/ML IJ SOLN: 0.5000 mg | Freq: Once | INTRAMUSCULAR | Status: DC | PRN

## 2012-08-14 NOTE — Patient Instructions (Addendum)
[facility] Discharge Instructions for Patients Receiving Chemotherapy  Today you received the following chemotherapy agents camptosar/ cisplatin  To help prevent nausea and vomiting after your treatment, we encourage you to take your nausea medication  and take it as often as prescribed   If you develop nausea and vomiting that is not controlled by your nausea medication, call the clinic. If it is after clinic hours your family physician or the after hours number for the clinic or go to the Emergency Department.   BELOW ARE SYMPTOMS THAT SHOULD BE REPORTED IMMEDIATELY:  *FEVER GREATER THAN 100.5 F  *CHILLS WITH OR WITHOUT FEVER  NAUSEA AND VOMITING THAT IS NOT CONTROLLED WITH YOUR NAUSEA MEDICATION  *UNUSUAL SHORTNESS OF BREATH  *UNUSUAL BRUISING OR BLEEDING  TENDERNESS IN MOUTH AND THROAT WITH OR WITHOUT PRESENCE OF ULCERS  *URINARY PROBLEMS  *BOWEL PROBLEMS  UNUSUAL RASH Items with * indicate a potential emergency and should be followed up as soon as possible.  One of the nurses will contact you 24 hours after your treatment. Please let the nurse know about any problems that you may have experienced. Feel free to call the clinic you have any questions or concerns. The clinic phone number is [phone].   I have been informed and understand all the instructions given to me. I know to contact the clinic, my physician, or go to the Emergency Department if any problems should occur. I do not have any questions at this time, but understand that I may call the clinic during office hours   should I have any questions or need assistance in obtaining follow up care.    __________________________________________  _____________  __________ Signature of Patient or Authorized Representative            Date                   Time    __________________________________________ Nurse's Signature

## 2012-08-15 ENCOUNTER — Telehealth: Payer: Self-pay | Admitting: Internal Medicine

## 2012-08-21 ENCOUNTER — Ambulatory Visit: Payer: Medicare Other

## 2012-08-21 ENCOUNTER — Other Ambulatory Visit: Payer: Medicare Other

## 2012-08-27 ENCOUNTER — Other Ambulatory Visit (HOSPITAL_BASED_OUTPATIENT_CLINIC_OR_DEPARTMENT_OTHER): Payer: Medicare Other | Admitting: Lab

## 2012-08-27 ENCOUNTER — Other Ambulatory Visit: Payer: Self-pay | Admitting: Internal Medicine

## 2012-08-27 ENCOUNTER — Ambulatory Visit (HOSPITAL_COMMUNITY)
Admission: RE | Admit: 2012-08-27 | Discharge: 2012-08-27 | Disposition: A | Discharge status: Home / Self Care | Payer: Medicare Other | Source: Ambulatory Visit | Attending: Internal Medicine | Admitting: Internal Medicine

## 2012-08-27 ENCOUNTER — Encounter (HOSPITAL_COMMUNITY): Payer: Self-pay

## 2012-08-27 DIAGNOSIS — R911 Solitary pulmonary nodule: Secondary | ICD-10-CM | POA: Insufficient documentation

## 2012-08-27 DIAGNOSIS — C3490 Malignant neoplasm of unspecified part of unspecified bronchus or lung: Secondary | ICD-10-CM

## 2012-08-27 DIAGNOSIS — Q619 Cystic kidney disease, unspecified: Secondary | ICD-10-CM | POA: Insufficient documentation

## 2012-08-27 DIAGNOSIS — C3430 Malignant neoplasm of lower lobe, unspecified bronchus or lung: Secondary | ICD-10-CM

## 2012-08-27 DIAGNOSIS — R599 Enlarged lymph nodes, unspecified: Secondary | ICD-10-CM | POA: Insufficient documentation

## 2012-08-27 DIAGNOSIS — R109 Unspecified abdominal pain: Secondary | ICD-10-CM | POA: Insufficient documentation

## 2012-08-27 LAB — CBC WITH DIFFERENTIAL/PLATELET
Eosinophils Absolute: 0 10*3/uL (ref 0.0–0.5)
HCT: 32.2 % — ABNORMAL LOW (ref 38.4–49.9)
LYMPH%: 32.2 % (ref 14.0–49.0)
MONO#: 0.5 10*3/uL (ref 0.1–0.9)
NEUT#: 2.4 10*3/uL (ref 1.5–6.5)
Platelets: 143 10*3/uL (ref 140–400)
RBC: 3.04 10*6/uL — ABNORMAL LOW (ref 4.20–5.82)
WBC: 4.4 10*3/uL (ref 4.0–10.3)
lymph#: 1.4 10*3/uL (ref 0.9–3.3)

## 2012-08-27 LAB — COMPREHENSIVE METABOLIC PANEL (CC13)
ALT: 12 U/L (ref 0–55)
Albumin: 3.3 g/dL — ABNORMAL LOW (ref 3.5–5.0)
CO2: 28 mEq/L (ref 22–29)
Calcium: 10.3 mg/dL (ref 8.4–10.4)
Chloride: 104 mEq/L (ref 98–107)
Glucose: 112 mg/dl — ABNORMAL HIGH (ref 70–99)
Potassium: 4.5 mEq/L (ref 3.5–5.1)
Sodium: 140 mEq/L (ref 136–145)
Total Bilirubin: 0.33 mg/dL (ref 0.20–1.20)
Total Protein: 7 g/dL (ref 6.4–8.3)

## 2012-08-28 ENCOUNTER — Other Ambulatory Visit: Payer: Medicare Other | Admitting: Lab

## 2012-08-30 ENCOUNTER — Telehealth: Payer: Self-pay | Admitting: Internal Medicine

## 2012-08-30 ENCOUNTER — Ambulatory Visit (HOSPITAL_BASED_OUTPATIENT_CLINIC_OR_DEPARTMENT_OTHER): Payer: Medicare Other | Admitting: Internal Medicine

## 2012-08-30 ENCOUNTER — Encounter: Payer: Self-pay | Admitting: Internal Medicine

## 2012-08-30 VITALS — BP 127/80 | HR 92 | Temp 97.5°F | Resp 18 | Ht 73.0 in | Wt 243.8 lb

## 2012-08-30 DIAGNOSIS — C3491 Malignant neoplasm of unspecified part of right bronchus or lung: Secondary | ICD-10-CM

## 2012-08-30 DIAGNOSIS — C3430 Malignant neoplasm of lower lobe, unspecified bronchus or lung: Secondary | ICD-10-CM

## 2012-08-30 NOTE — Telephone Encounter (Signed)
gv and printed appt schedule for pt for March...emailed MB to add tx

## 2012-08-30 NOTE — Progress Notes (Signed)
[facility] Telephone:(336) [phone]   Fax:(336) [phone]  OFFICE PROGRESS NOTE  Francis Inch, MD [phone] [place] [address]  DIAGNOSIS: Extensive stage small cell lung cancer diagnosed in August of 2013.   PRIOR THERAPY:  1) Systemic chemotherapy in the form of carboplatin for an AUC of 5 given on day 1 and etoposide 100 mg per meter squared given on days 1, 2 and 3 with Neulasta support given on day 4. Status post 2 cycle, discontinued today secondary to disease progression. 2) Systemic chemotherapy with cisplatin 30 mg/M2 and irinotecan 65 mg/M2 on days 1 and 8 every 3 weeks. Status post 5 cycles, last dose was given 07/09/2012 discontinued secondary to disease progression.  CURRENT THERAPY: The patient will start next week the first cycle of systemic chemotherapy with paclitaxel 75 mg/M2.  INTERVAL HISTORY: [name] 72 y.o. male returns to the clinic today for followup visit. The patient is feeling fine today with no specific complaints. He denied having any significant chest pain, shortness breath, cough or hemoptysis. He denied having any weight loss or night sweats. He has no nausea or vomiting. He tolerated the last cycle of his systemic chemotherapy with irinotecan and cisplatin fairly well. The patient had repeat CT scan of the chest, abdomen and pelvis performed recently and he is here today for evaluation and discussion of his scan results.  MEDICAL HISTORY: Past Medical History  Diagnosis Date  . Hypertension   . ALLERGIC RHINITIS   . Malignant neoplasm of bronchus and lung, unspecified site 01/23/2012    ALLERGIES:  has No Known Allergies.  MEDICATIONS:  Current Outpatient Prescriptions  Medication Sig Dispense Refill  . aspirin EC 325 MG tablet Take 1 tablet (325 mg total) by mouth 2 (two) times daily.  30 tablet  0  . folic acid (FOLVITE) 1 MG tablet Take 1 tablet (1 mg total) by mouth daily.  30 tablet  1  .  olmesartan-hydrochlorothiazide (BENICAR HCT) 40-12.5 MG per tablet Take 0.5 tablets by mouth daily.       Marland Kitchen oxyCODONE (OXY IR/ROXICODONE) 5 MG immediate release tablet Take 1 tablet (5 mg total) by mouth every 6 (six) hours as needed.  90 tablet  0  . vitamin B-12 (CYANOCOBALAMIN) 1000 MCG tablet Take 1,000 mcg by mouth daily.       No current facility-administered medications for this visit.    SURGICAL HISTORY:  Past Surgical History  Procedure Laterality Date  . Wrist surgery  1961    right wrist  . Orif ankle fracture  05/07/2012    Procedure: OPEN REDUCTION INTERNAL FIXATION (ORIF) ANKLE FRACTURE;  Surgeon: Mable Paris, MD;  Location: [facility];  Service: Orthopedics;  Laterality: Right;    REVIEW OF SYSTEMS:  A comprehensive review of systems was negative except for: Constitutional: positive for fatigue Respiratory: positive for dyspnea on exertion   PHYSICAL EXAMINATION: General appearance: alert, cooperative and no distress Head: Normocephalic, without obvious abnormality, atraumatic Neck: no adenopathy Lymph nodes: Cervical, supraclavicular, and axillary nodes normal. Resp: clear to auscultation bilaterally Cardio: regular rate and rhythm, S1, S2 normal, no murmur, click, rub or gallop GI: soft, non-tender; bowel sounds normal; no masses,  no organomegaly Extremities: extremities normal, atraumatic, no cyanosis or edema Neurologic: Alert and oriented X 3, normal strength and tone. Normal symmetric reflexes. Normal coordination and gait  ECOG PERFORMANCE STATUS: 1 - Symptomatic but completely ambulatory  Blood pressure 127/80, pulse 92, temperature 97.5  F (36.4 C), temperature source Oral, resp. rate 18, height 6\' 1"  (1.854 m), weight 243 lb 12.8 oz (110.587 kg).  LABORATORY DATA: Lab Results  Component Value Date   WBC 4.4 08/27/2012   HGB 11.3* 08/27/2012   HCT 32.2* 08/27/2012   MCV 106.2* 08/27/2012   PLT 143 08/27/2012      Chemistry      Component  Value Date/Time   NA 140 08/27/2012 1006   NA 134* 06/23/2012 0228   K 4.5 08/27/2012 1006   K 4.1 06/23/2012 0228   CL 104 08/27/2012 1006   CL 100 06/23/2012 0228   CO2 28 08/27/2012 1006   CO2 27 06/23/2012 0228   BUN 28.0* 08/27/2012 1006   BUN 20 06/23/2012 0228   CREATININE 2.0* 08/27/2012 1006   CREATININE 1.27 06/23/2012 0228      Component Value Date/Time   CALCIUM 10.3 08/27/2012 1006   CALCIUM 10.0 06/23/2012 0228   ALKPHOS 95 08/27/2012 1006   ALKPHOS 57 05/11/2012 0625   AST 16 08/27/2012 1006   AST 15 05/11/2012 0625   ALT 12 08/27/2012 1006   ALT 11 05/11/2012 0625   BILITOT 0.33 08/27/2012 1006   BILITOT 0.4 05/11/2012 0625       RADIOGRAPHIC STUDIES: Ct Chest Wo Contrast  08/27/2012  *RADIOLOGY REPORT*  Clinical Data:  Lung cancer, chemotherapy ongoing, right flank pain  CT CHEST, ABDOMEN AND PELVIS WITHOUT CONTRAST  Technique:  Multidetector CT imaging of the chest, abdomen and pelvis was performed following the standard protocol without IV contrast.  Comparison:  CT 06/08/2012   CT CHEST  Findings:  No axillary or supraclavicular lymphadenopathy.  No mediastinal or hilar lymphadenopathy.  No pericardial fluid.  Review of the lung parenchyma demonstrates interval enlargement of a pleural nodule within the right lateral hemithorax measuring 14 mm (image 32) increased from 9 mm on prior.  Nodular parenchymal lesion in a branching pattern extending from the right lower lobe measures 30 mm x 21 mm (image 41) increased from 17 x 13 mm on prior.  This pattern is concerning for lymphangitic spread of carcinoma.  Round nodule over the left hemi diaphragm measures 9 mm (image 45) unchanged from 9 mm on prior.  There is interval increase in size of right lower paratracheal lymph node measuring 17 mm short axis compared to 12 on prior. Subcarinal lymph node adjacent to the right lower lobe bronchus measures 19 mm increased from 9 mm on prior.  These lymph nodes are not increased back to the  level of the staging FDG PET scan 01/27/2012.  IMPRESSION:  1.  Increased linear nodular thickening extending from the right hilum into the right lower lobe is concerning for lymphangitic spread of carcinoma. 2. Enlarged nodular density in the right lateral pleural space is consistent with pleural spread of carcinoma. 3.  These lesions were present on comparison PET CT scan and hypermetabolic indicating no new disease but recurrence. 4.  Interval increase in adenopathy in the mediastinum back to the level of the staging PET scan.   CT ABDOMEN AND PELVIS  Findings:  Non-IV contrast images demonstrate no focal hepatic lesion.  The gallbladder, pancreas, spleen, adrenal glands, and kidneys are unchanged.  There are bilateral low-density renal cysts.  The stomach, small bowel, appendix, and cecum are normal.  The colon and rectosigmoid colon are normal.  No free fluid the pelvis.  No pelvic lymphadenopathy. Retroperitoneal lymphadenopathy.  Review of  bone windows demonstrates no aggressive osseous lesions.  IMPRESSION:  1.  No evidence metastasis in the abdomen pelvis. 2.  Normal adrenal glands.   Original Report Authenticated By: Genevive Bi, M.D.     ASSESSMENT: This is a very pleasant 72 years old African American male with extensive stage small cell lung cancer, status post second line chemotherapy with cisplatin and irinotecan but unfortunately the patient has evidence for disease progression on his recent scan.   PLAN: I discussed the scan results with the patient today. I recommended for him to discontinue treatment with cisplatin and irinotecan. I would consider the patient for treatment with single agent paclitaxel 70 mg/M2 on a weekly basis.I discussed with the patient adverse effect of this treatment including but not limited to alopecia, myelosuppression, nausea and vomiting, peripheral neuropathy, liver or renal dysfunction. He agreed to proceed with treatment as planned. I will start the first  cycle of this treatment next week. The patient would come back for followup visit in 2 weeks for evaluation and management any adverse effect of his chemotherapy. The patient was advised to call immediately if he has any concerning symptoms in the interval.  All questions were answered. The patient knows to call the clinic with any problems, questions or concerns. We can certainly see the patient much sooner if necessary.  I spent 15 minutes counseling the patient face to face. The total time spent in the appointment was 25 minutes.

## 2012-09-01 NOTE — Patient Instructions (Signed)
Your recent scan showed evidence for disease progression.  We discussed changing chemotherapy to single agent paclitaxel. First cycle next week. Follow up visit in 2 weeks

## 2012-09-04 ENCOUNTER — Other Ambulatory Visit (HOSPITAL_BASED_OUTPATIENT_CLINIC_OR_DEPARTMENT_OTHER): Payer: Medicare Other | Admitting: Lab

## 2012-09-04 ENCOUNTER — Ambulatory Visit (HOSPITAL_BASED_OUTPATIENT_CLINIC_OR_DEPARTMENT_OTHER): Payer: Medicare Other

## 2012-09-04 VITALS — BP 155/81 | HR 64 | Temp 98.4°F | Resp 20

## 2012-09-04 DIAGNOSIS — C3491 Malignant neoplasm of unspecified part of right bronchus or lung: Secondary | ICD-10-CM

## 2012-09-04 DIAGNOSIS — C7A1 Malignant poorly differentiated neuroendocrine tumors: Secondary | ICD-10-CM

## 2012-09-04 DIAGNOSIS — Z5111 Encounter for antineoplastic chemotherapy: Secondary | ICD-10-CM

## 2012-09-04 DIAGNOSIS — C3430 Malignant neoplasm of lower lobe, unspecified bronchus or lung: Secondary | ICD-10-CM

## 2012-09-04 LAB — COMPREHENSIVE METABOLIC PANEL (CC13)
ALT: 13 U/L (ref 0–55)
AST: 15 U/L (ref 5–34)
Albumin: 3.1 g/dL — ABNORMAL LOW (ref 3.5–5.0)
BUN: 22.3 mg/dL (ref 7.0–26.0)
Calcium: 9.9 mg/dL (ref 8.4–10.4)
Chloride: 105 mEq/L (ref 98–107)
Potassium: 4.2 mEq/L (ref 3.5–5.1)
Sodium: 141 mEq/L (ref 136–145)
Total Protein: 6.6 g/dL (ref 6.4–8.3)

## 2012-09-04 LAB — CBC WITH DIFFERENTIAL/PLATELET
BASO%: 0.5 % (ref 0.0–2.0)
HCT: 32.5 % — ABNORMAL LOW (ref 38.4–49.9)
MCHC: 34.5 g/dL (ref 32.0–36.0)
MONO#: 0.4 10*3/uL (ref 0.1–0.9)
NEUT%: 57.5 % (ref 39.0–75.0)
RDW: 15.1 % — ABNORMAL HIGH (ref 11.0–14.6)
WBC: 4.1 10*3/uL (ref 4.0–10.3)
lymph#: 1.2 10*3/uL (ref 0.9–3.3)
nRBC: 0 % (ref 0–0)

## 2012-09-04 MED ORDER — SODIUM CHLORIDE 0.9 % IV SOLN
Freq: Once | INTRAVENOUS | Status: AC
  Administered 2012-09-04: 10:00:00 via INTRAVENOUS

## 2012-09-04 MED ORDER — SODIUM CHLORIDE 0.9 % IV SOLN
70.0000 mg/m2 | Freq: Once | INTRAVENOUS | Status: AC
  Administered 2012-09-04: 168 mg via INTRAVENOUS
  Filled 2012-09-04: qty 28

## 2012-09-04 MED ORDER — FAMOTIDINE IN NACL 20-0.9 MG/50ML-% IV SOLN
20.0000 mg | Freq: Once | INTRAVENOUS | Status: AC
  Administered 2012-09-04: 20 mg via INTRAVENOUS

## 2012-09-04 MED ORDER — DEXAMETHASONE SODIUM PHOSPHATE 4 MG/ML IJ SOLN
20.0000 mg | Freq: Once | INTRAMUSCULAR | Status: AC
  Administered 2012-09-04: 20 mg via INTRAVENOUS

## 2012-09-04 MED ORDER — PACLITAXEL CHEMO INJECTION 300 MG/50ML: 70.0000 mg/m2 | Freq: Once | INTRAVENOUS | Status: DC

## 2012-09-04 MED ORDER — DIPHENHYDRAMINE HCL 50 MG/ML IJ SOLN
50.0000 mg | Freq: Once | INTRAMUSCULAR | Status: AC
  Administered 2012-09-04: 50 mg via INTRAVENOUS

## 2012-09-04 NOTE — Patient Instructions (Addendum)
[facility] Discharge Instructions for Patients Receiving Chemotherapy  Today you received the following chemotherapy agents Taxol  To help prevent nausea and vomiting after your treatment, we encourage you to take your nausea medication as per Dr. Arbutus Ped  If you develop nausea and vomiting that is not controlled by your nausea medication, call the clinic. If it is after clinic hours your family physician or the after hours number for the clinic or go to the Emergency Department.   BELOW ARE SYMPTOMS THAT SHOULD BE REPORTED IMMEDIATELY:  *FEVER GREATER THAN 100.5 F  *CHILLS WITH OR WITHOUT FEVER  NAUSEA AND VOMITING THAT IS NOT CONTROLLED WITH YOUR NAUSEA MEDICATION  *UNUSUAL SHORTNESS OF BREATH  *UNUSUAL BRUISING OR BLEEDING  TENDERNESS IN MOUTH AND THROAT WITH OR WITHOUT PRESENCE OF ULCERS  *URINARY PROBLEMS  *BOWEL PROBLEMS  UNUSUAL RASH Items with * indicate a potential emergency and should be followed up as soon as possible.  One of the nurses will contact you 24 hours after your treatment. Please let the nurse know about any problems that you may have experienced. Feel free to call the clinic you have any questions or concerns. The clinic phone number is [phone].   I have been informed and understand all the instructions given to me. I know to contact the clinic, my physician, or go to the Emergency Department if any problems should occur. I do not have any questions at this time, but understand that I may call the clinic during office hours   should I have any questions or need assistance in obtaining follow up care.    __________________________________________  _____________  __________ Signature of Patient or Authorized Representative            Date                   Time    __________________________________________ Nurse's Signature Paclitaxel injection What is this medicine? PACLITAXEL (PAK li TAX el) is a chemotherapy drug. It targets  fast dividing cells, like cancer cells, and causes these cells to die. This medicine is used to treat ovarian cancer, breast cancer, and other cancers. This medicine may be used for other purposes; ask your health care provider or pharmacist if you have questions. What should I tell my health care provider before I take this medicine? They need to know if you have any of these conditions: -blood disorders -irregular heartbeat -infection (especially a virus infection such as chickenpox, cold sores, or herpes) -liver disease -previous or ongoing radiation therapy -an unusual or allergic reaction to paclitaxel, alcohol, polyoxyethylated castor oil, other chemotherapy agents, other medicines, foods, dyes, or preservatives -pregnant or trying to get pregnant -breast-feeding How should I use this medicine? This drug is given as an infusion into a vein. It is administered in a hospital or clinic by a specially trained health care professional. Talk to your pediatrician regarding the use of this medicine in children. Special care may be needed. Overdosage: If you think you have taken too much of this medicine contact a poison control center or emergency room at once. NOTE: This medicine is only for you. Do not share this medicine with others. What if I miss a dose? It is important not to miss your dose. Call your doctor or health care professional if you are unable to keep an appointment. What may interact with this medicine? Do not take this medicine with any of the following medications: -disulfiram -metronidazole This medicine may also interact  with the following medications: -cyclosporine -dexamethasone -diazepam -ketoconazole -medicines to increase blood counts like filgrastim, pegfilgrastim, sargramostim -other chemotherapy drugs like cisplatin, doxorubicin, epirubicin, etoposide, teniposide, vincristine -quinidine -testosterone -vaccines -verapamil Talk to your doctor or health care  professional before taking any of these medicines: -acetaminophen -aspirin -ibuprofen -ketoprofen -naproxen This list may not describe all possible interactions. Give your health care provider a list of all the medicines, herbs, non-prescription drugs, or dietary supplements you use. Also tell them if you smoke, drink alcohol, or use illegal drugs. Some items may interact with your medicine. What should I watch for while using this medicine? Your condition will be monitored carefully while you are receiving this medicine. You will need important blood work done while you are taking this medicine. This drug may make you feel generally unwell. This is not uncommon, as chemotherapy can affect healthy cells as well as cancer cells. Report any side effects. Continue your course of treatment even though you feel ill unless your doctor tells you to stop. In some cases, you may be given additional medicines to help with side effects. Follow all directions for their use. Call your doctor or health care professional for advice if you get a fever, chills or sore throat, or other symptoms of a cold or flu. Do not treat yourself. This drug decreases your body's ability to fight infections. Try to avoid being around people who are sick. This medicine may increase your risk to bruise or bleed. Call your doctor or health care professional if you notice any unusual bleeding. Be careful brushing and flossing your teeth or using a toothpick because you may get an infection or bleed more easily. If you have any dental work done, tell your dentist you are receiving this medicine. Avoid taking products that contain aspirin, acetaminophen, ibuprofen, naproxen, or ketoprofen unless instructed by your doctor. These medicines may hide a fever. Do not become pregnant while taking this medicine. Women should inform their doctor if they wish to become pregnant or think they might be pregnant. There is a potential for serious side  effects to an unborn child. Talk to your health care professional or pharmacist for more information. Do not breast-feed an infant while taking this medicine. Men are advised not to father a child while receiving this medicine. What side effects may I notice from receiving this medicine? Side effects that you should report to your doctor or health care professional as soon as possible: -allergic reactions like skin rash, itching or hives, swelling of the face, lips, or tongue -low blood counts - This drug may decrease the number of white blood cells, red blood cells and platelets. You may be at increased risk for infections and bleeding. -signs of infection - fever or chills, cough, sore throat, pain or difficulty passing urine -signs of decreased platelets or bleeding - bruising, pinpoint red spots on the skin, black, tarry stools, nosebleeds -signs of decreased red blood cells - unusually weak or tired, fainting spells, lightheadedness -breathing problems -chest pain -high or low blood pressure -mouth sores -nausea and vomiting -pain, swelling, redness or irritation at the injection site -pain, tingling, numbness in the hands or feet -slow or irregular heartbeat -swelling of the ankle, feet, hands Side effects that usually do not require medical attention (report to your doctor or health care professional if they continue or are bothersome): -bone pain -complete hair loss including hair on your head, underarms, pubic hair, eyebrows, and eyelashes -changes in the color of fingernails -diarrhea -loosening  of the fingernails -loss of appetite -muscle or joint pain -red flush to skin -sweating This list may not describe all possible side effects. Call your doctor for medical advice about side effects. You may report side effects to FDA at 1-800-FDA-1088. Where should I keep my medicine? This drug is given in a hospital or clinic and will not be stored at home. NOTE: This sheet is a  summary. It may not cover all possible information. If you have questions about this medicine, talk to your doctor, pharmacist, or health care provider.  2013, [employer]. (05/12/2008 11:54:26 AM)

## 2012-09-05 ENCOUNTER — Telehealth: Payer: Self-pay | Admitting: *Deleted

## 2012-09-05 NOTE — Telephone Encounter (Signed)
Chemo follow up, no nausea, vomiting, diarrhea noted. [name] says he feels great. Stayed up eating all night, had such a good appetite. Informed patient to call with any developing issues.

## 2012-09-05 NOTE — Telephone Encounter (Signed)
Message copied by Kathlynn Grate on Wed Sep 05, 2012  5:09 PM ------      Message from: Amado Coe      Created: Tue Sep 04, 2012 10:14 AM      Regarding: chemo follow up       Contact: [phone]       Taxol 09/04/12 Dr. Arbutus Ped ------

## 2012-09-11 ENCOUNTER — Ambulatory Visit (HOSPITAL_BASED_OUTPATIENT_CLINIC_OR_DEPARTMENT_OTHER): Payer: Medicare Other

## 2012-09-11 ENCOUNTER — Ambulatory Visit (HOSPITAL_BASED_OUTPATIENT_CLINIC_OR_DEPARTMENT_OTHER): Payer: Medicare Other | Admitting: Physician Assistant

## 2012-09-11 ENCOUNTER — Other Ambulatory Visit (HOSPITAL_BASED_OUTPATIENT_CLINIC_OR_DEPARTMENT_OTHER): Payer: Medicare Other

## 2012-09-11 ENCOUNTER — Telehealth: Payer: Self-pay | Admitting: *Deleted

## 2012-09-11 ENCOUNTER — Encounter: Payer: Self-pay | Admitting: Physician Assistant

## 2012-09-11 VITALS — BP 127/80 | HR 107 | Temp 98.1°F | Resp 18 | Ht 73.0 in | Wt 243.3 lb

## 2012-09-11 DIAGNOSIS — C7A1 Malignant poorly differentiated neuroendocrine tumors: Secondary | ICD-10-CM

## 2012-09-11 DIAGNOSIS — Z5111 Encounter for antineoplastic chemotherapy: Secondary | ICD-10-CM

## 2012-09-11 DIAGNOSIS — C3491 Malignant neoplasm of unspecified part of right bronchus or lung: Secondary | ICD-10-CM

## 2012-09-11 DIAGNOSIS — B370 Candidal stomatitis: Secondary | ICD-10-CM

## 2012-09-11 DIAGNOSIS — C3490 Malignant neoplasm of unspecified part of unspecified bronchus or lung: Secondary | ICD-10-CM

## 2012-09-11 LAB — CBC WITH DIFFERENTIAL/PLATELET
BASO%: 0.2 % (ref 0.0–2.0)
EOS%: 2.4 % (ref 0.0–7.0)
MCH: 35.8 pg — ABNORMAL HIGH (ref 27.2–33.4)
MCHC: 34.9 g/dL (ref 32.0–36.0)
MCV: 102.6 fL — ABNORMAL HIGH (ref 79.3–98.0)
MONO%: 4.8 % (ref 0.0–14.0)
RBC: 3.07 10*6/uL — ABNORMAL LOW (ref 4.20–5.82)
RDW: 14.2 % (ref 11.0–14.6)
lymph#: 1.7 10*3/uL (ref 0.9–3.3)

## 2012-09-11 LAB — COMPREHENSIVE METABOLIC PANEL (CC13)
AST: 21 U/L (ref 5–34)
Albumin: 3.6 g/dL (ref 3.5–5.0)
Alkaline Phosphatase: 91 U/L (ref 40–150)
BUN: 30.5 mg/dL — ABNORMAL HIGH (ref 7.0–26.0)
Potassium: 4.5 mEq/L (ref 3.5–5.1)
Sodium: 137 mEq/L (ref 136–145)
Total Bilirubin: 0.45 mg/dL (ref 0.20–1.20)
Total Protein: 7.5 g/dL (ref 6.4–8.3)

## 2012-09-11 MED ORDER — FAMOTIDINE IN NACL 20-0.9 MG/50ML-% IV SOLN
20.0000 mg | Freq: Once | INTRAVENOUS | Status: AC
  Administered 2012-09-11: 20 mg via INTRAVENOUS

## 2012-09-11 MED ORDER — DIPHENHYDRAMINE HCL 50 MG/ML IJ SOLN
50.0000 mg | Freq: Once | INTRAMUSCULAR | Status: AC
  Administered 2012-09-11: 50 mg via INTRAVENOUS

## 2012-09-11 MED ORDER — DEXAMETHASONE SODIUM PHOSPHATE 4 MG/ML IJ SOLN
20.0000 mg | Freq: Once | INTRAMUSCULAR | Status: AC
  Administered 2012-09-11: 20 mg via INTRAVENOUS

## 2012-09-11 MED ORDER — FLUCONAZOLE 100 MG PO TABS: ORAL_TABLET | ORAL | Status: DC

## 2012-09-11 MED ORDER — OXYCODONE HCL 5 MG PO TABS: 5.0000 mg | ORAL_TABLET | Freq: Four times a day (QID) | ORAL | Status: DC | PRN

## 2012-09-11 MED ORDER — SODIUM CHLORIDE 0.9 % IV SOLN
70.0000 mg/m2 | Freq: Once | INTRAVENOUS | Status: AC
  Administered 2012-09-11: 168 mg via INTRAVENOUS
  Filled 2012-09-11: qty 28

## 2012-09-11 NOTE — Patient Instructions (Addendum)
[facility] Discharge Instructions for Patients Receiving Chemotherapy  Today you received the following chemotherapy agents:Taxol To help prevent nausea and vomiting after your treatment, we encourage you to take your nausea medication as often as prescribed.   If you develop nausea and vomiting that is not controlled by your nausea medication, call the clinic.    BELOW ARE SYMPTOMS THAT SHOULD BE REPORTED IMMEDIATELY:  *FEVER GREATER THAN 100.5 F  *CHILLS WITH OR WITHOUT FEVER  NAUSEA AND VOMITING THAT IS NOT CONTROLLED WITH YOUR NAUSEA MEDICATION  *UNUSUAL SHORTNESS OF BREATH  *UNUSUAL BRUISING OR BLEEDING  TENDERNESS IN MOUTH AND THROAT WITH OR WITHOUT PRESENCE OF ULCERS  *URINARY PROBLEMS  *BOWEL PROBLEMS  UNUSUAL RASH Items with * indicate a potential emergency and should be followed up as soon as possible.

## 2012-09-11 NOTE — Progress Notes (Signed)
[facility] Telephone:([phone]   Fax:([phone]) [phone]  OFFICE PROGRESS NOTE  [name], MD [phone] [place] [address]  DIAGNOSIS: Extensive stage small cell lung cancer diagnosed in August of 2013.   PRIOR THERAPY:  1) Systemic chemotherapy in the form of carboplatin for an AUC of 5 given on day 1 and etoposide 100 mg per meter squared given on days 1, 2 and 3 with Neulasta support given on day 4. Status post 2 cycle, discontinued today secondary to disease progression. 2) Systemic chemotherapy with cisplatin 30 mg/M2 and irinotecan 65 mg/M2 on days 1 and 8 every 3 weeks. Status post 5 cycles, last dose was given 07/09/2012 discontinued secondary to disease progression.  CURRENT THERAPY:  Systemic chemotherapy with paclitaxel 70 mg/M2 given weekly. Status post 1 cycle  INTERVAL HISTORY: [name] 72 y.o. male returns to the clinic today for followup visit. The patient is feeling fine today with no specific complaints. He denied having any significant chest pain, shortness breath, cough or hemoptysis. He denied having any weight loss or night sweats. He has no nausea or vomiting. He tolerated his first cycle of paclitaxel without significant difficulty. He did experience some "shakiness and inability to sleep at night of chemotherapy. By the next night he was able to sleep well. He reports a good appetite and bowels moving regularly. He occasionally has right side pain and requests a refill for his pain medication. He continues to have shortness of breath with exertion.   MEDICAL HISTORY: Past Medical History  Diagnosis Date  . Hypertension   . ALLERGIC RHINITIS   . Malignant neoplasm of bronchus and lung, unspecified site 01/23/2012    ALLERGIES:  has No Known Allergies.  MEDICATIONS:  Current Outpatient Prescriptions  Medication Sig Dispense Refill  . aspirin EC 325 MG tablet Take 1 tablet (325 mg total) by mouth 2 (two) times daily.  30  tablet  0  . fluconazole (DIFLUCAN) 100 MG tablet Take 2 tablets by mouth on day one, then take 1 tablet by mouth daily until completed  16 tablet  0  . folic acid (FOLVITE) 1 MG tablet Take 1 tablet (1 mg total) by mouth daily.  30 tablet  1  . olmesartan-hydrochlorothiazide (BENICAR HCT) 40-12.5 MG per tablet Take 0.5 tablets by mouth daily.       Marland Kitchen oxyCODONE (OXY IR/ROXICODONE) 5 MG immediate release tablet Take 1 tablet (5 mg total) by mouth every 6 (six) hours as needed.  60 tablet  0  . vitamin B-12 (CYANOCOBALAMIN) 1000 MCG tablet Take 1,000 mcg by mouth daily.       No current facility-administered medications for this visit.   Facility-Administered Medications Ordered in Other Visits  Medication Dose Route Frequency Provider Last Rate Last Dose  . PACLitaxel (TAXOL) 168 mg in sodium chloride 0.9 % 250 mL chemo infusion (> 80mg /m2)  70 mg/m2 (Treatment Plan Actual) Intravenous Once Si Gaul, MD 93 mL/hr at 09/11/12 1420 168 mg at 09/11/12 1420    SURGICAL HISTORY:  Past Surgical History  Procedure Laterality Date  . Wrist surgery  1961    right wrist  . Orif ankle fracture  05/07/2012    Procedure: OPEN REDUCTION INTERNAL FIXATION (ORIF) ANKLE FRACTURE;  Surgeon: Mable Paris, MD;  Location: [facility];  Service: Orthopedics;  Laterality: Right;    REVIEW OF SYSTEMS:  A comprehensive review of systems was negative except for: Respiratory: positive for dyspnea on  exertion Musculoskeletal: positive for Right side pain   PHYSICAL EXAMINATION: General appearance: alert, cooperative and no distress Head: Normocephalic, without obvious abnormality, atraumatic Neck: no adenopathy Lymph nodes: Cervical, supraclavicular, and axillary nodes normal. Resp: clear to auscultation bilaterally Cardio: regular rate and rhythm, S1, S2 normal, no murmur, click, rub or gallop GI: soft, non-tender; bowel sounds normal; no masses,  no organomegaly Extremities: extremities normal,  atraumatic, no cyanosis or edema Neurologic: Alert and oriented X 3, normal strength and tone. Normal symmetric reflexes. Normal coordination and gait  ECOG PERFORMANCE STATUS: 1 - Symptomatic but completely ambulatory  Blood pressure 127/80, pulse 107, temperature 98.1 F (36.7 C), temperature source Oral, resp. rate 18, height 6\' 1"  (1.854 m), weight 243 lb 4.8 oz (110.36 kg).  LABORATORY DATA: Lab Results  Component Value Date   WBC 4.5 09/11/2012   HGB 11.0* 09/11/2012   HCT 31.5* 09/11/2012   MCV 102.6* 09/11/2012   PLT 94* 09/11/2012      Chemistry      Component Value Date/Time   NA 137 09/11/2012 1151   NA 134* 06/23/2012 0228   K 4.5 Sl. Hemolysis 09/11/2012 1151   K 4.1 06/23/2012 0228   CL 104 09/11/2012 1151   CL 100 06/23/2012 0228   CO2 23 09/11/2012 1151   CO2 27 06/23/2012 0228   BUN 30.5* 09/11/2012 1151   BUN 20 06/23/2012 0228   CREATININE 2.1* 09/11/2012 1151   CREATININE 1.27 06/23/2012 0228      Component Value Date/Time   CALCIUM 10.5* 09/11/2012 1151   CALCIUM 10.0 06/23/2012 0228   ALKPHOS 91 09/11/2012 1151   ALKPHOS 57 05/11/2012 0625   AST 21 09/11/2012 1151   AST 15 05/11/2012 0625   ALT 11 09/11/2012 1151   ALT 11 05/11/2012 0625   BILITOT 0.45 09/11/2012 1151   BILITOT 0.4 05/11/2012 0625       RADIOGRAPHIC STUDIES: Ct Chest Wo Contrast  08/27/2012  *RADIOLOGY REPORT*  Clinical Data:  Lung cancer, chemotherapy ongoing, right flank pain  CT CHEST, ABDOMEN AND PELVIS WITHOUT CONTRAST  Technique:  Multidetector CT imaging of the chest, abdomen and pelvis was performed following the standard protocol without IV contrast.  Comparison:  CT 06/08/2012   CT CHEST  Findings:  No axillary or supraclavicular lymphadenopathy.  No mediastinal or hilar lymphadenopathy.  No pericardial fluid.  Review of the lung parenchyma demonstrates interval enlargement of a pleural nodule within the right lateral hemithorax measuring 14 mm (image 32) increased from 9 mm on prior.  Nodular parenchymal  lesion in a branching pattern extending from the right lower lobe measures 30 mm x 21 mm (image 41) increased from 17 x 13 mm on prior.  This pattern is concerning for lymphangitic spread of carcinoma.  Round nodule over the left hemi diaphragm measures 9 mm (image 45) unchanged from 9 mm on prior.  There is interval increase in size of right lower paratracheal lymph node measuring 17 mm short axis compared to 12 on prior. Subcarinal lymph node adjacent to the right lower lobe bronchus measures 19 mm increased from 9 mm on prior.  These lymph nodes are not increased back to the level of the staging FDG PET scan 01/27/2012.  IMPRESSION:  1.  Increased linear nodular thickening extending from the right hilum into the right lower lobe is concerning for lymphangitic spread of carcinoma. 2. Enlarged nodular density in the right lateral pleural space is consistent with pleural spread of carcinoma. 3.  These lesions  were present on comparison PET CT scan and hypermetabolic indicating no new disease but recurrence. 4.  Interval increase in adenopathy in the mediastinum back to the level of the staging PET scan.   CT ABDOMEN AND PELVIS  Findings:  Non-IV contrast images demonstrate no focal hepatic lesion.  The gallbladder, pancreas, spleen, adrenal glands, and kidneys are unchanged.  There are bilateral low-density renal cysts.  The stomach, small bowel, appendix, and cecum are normal.  The colon and rectosigmoid colon are normal.  No free fluid the pelvis.  No pelvic lymphadenopathy. Retroperitoneal lymphadenopathy.  Review of  bone windows demonstrates no aggressive osseous lesions.  IMPRESSION:  1.  No evidence metastasis in the abdomen pelvis. 2.  Normal adrenal glands.   Original Report Authenticated By: Genevive Bi, M.D.     ASSESSMENT/PLAN: This is a very pleasant 72 years old African American male with extensive stage small cell lung cancer, status post second line chemotherapy with cisplatin and irinotecan  but unfortunately the patient has evidence for disease progression on his recent scan. He is currently being treated with paclitaxel at 70mg /M2 given weekly. He will continue with his weekly labs and weekly Taxol and return in 3 weeks for another symptom management visit. He was given a prescription for his OxyIR tablets a total of 60 tablets with no refill, taken 1 tablet by mouth every 4-6 hours as needed for pain.  [name], Daily Doe E, PA-C   The patient was advised to call immediately if he has any concerning symptoms in the interval.  All questions were answered. The patient knows to call the clinic with any problems, questions or concerns. We can certainly see the patient much sooner if necessary.  I spent 20 minutes counseling the patient face to face. The total time spent in the appointment was 30 minutes.

## 2012-09-11 NOTE — Telephone Encounter (Signed)
Per staff phone call and POF I have schedueld appts.  JMW  

## 2012-09-11 NOTE — Progress Notes (Signed)
OK to administer Taxol per Dr. Arbutus Ped despite counts.

## 2012-09-11 NOTE — Patient Instructions (Addendum)
Continue weekly labs as scheduled Followup in 3 weeks prior to her next scheduled cycle of chemotherapy 

## 2012-09-12 ENCOUNTER — Telehealth: Payer: Self-pay | Admitting: Internal Medicine

## 2012-09-12 NOTE — Telephone Encounter (Signed)
s.w. pt and advised on change in time of lab and tx...pt ok and aware

## 2012-09-18 ENCOUNTER — Other Ambulatory Visit: Payer: Medicare Other | Admitting: Lab

## 2012-09-18 ENCOUNTER — Other Ambulatory Visit (HOSPITAL_BASED_OUTPATIENT_CLINIC_OR_DEPARTMENT_OTHER): Payer: Medicare Other

## 2012-09-18 ENCOUNTER — Ambulatory Visit (HOSPITAL_BASED_OUTPATIENT_CLINIC_OR_DEPARTMENT_OTHER): Payer: Medicare Other

## 2012-09-18 DIAGNOSIS — C3430 Malignant neoplasm of lower lobe, unspecified bronchus or lung: Secondary | ICD-10-CM

## 2012-09-18 DIAGNOSIS — C3491 Malignant neoplasm of unspecified part of right bronchus or lung: Secondary | ICD-10-CM

## 2012-09-18 DIAGNOSIS — Z5111 Encounter for antineoplastic chemotherapy: Secondary | ICD-10-CM

## 2012-09-18 LAB — CBC WITH DIFFERENTIAL/PLATELET
Basophils Absolute: 0 10*3/uL (ref 0.0–0.1)
EOS%: 1.2 % (ref 0.0–7.0)
Eosinophils Absolute: 0.1 10*3/uL (ref 0.0–0.5)
HGB: 10.9 g/dL — ABNORMAL LOW (ref 13.0–17.1)
LYMPH%: 36.4 % (ref 14.0–49.0)
MCH: 36.1 pg — ABNORMAL HIGH (ref 27.2–33.4)
MCV: 104.3 fL — ABNORMAL HIGH (ref 79.3–98.0)
MONO%: 5 % (ref 0.0–14.0)
NEUT#: 2.4 10*3/uL (ref 1.5–6.5)
Platelets: 100 10*3/uL — ABNORMAL LOW (ref 140–400)
RDW: 13.7 % (ref 11.0–14.6)

## 2012-09-18 LAB — COMPREHENSIVE METABOLIC PANEL
Alkaline Phosphatase: 79 U/L (ref 39–117)
BUN: 34 mg/dL — ABNORMAL HIGH (ref 6–23)
CO2: 27 mEq/L (ref 19–32)
Creatinine, Ser: 2.06 mg/dL — ABNORMAL HIGH (ref 0.50–1.35)
Glucose, Bld: 157 mg/dL — ABNORMAL HIGH (ref 70–99)
Sodium: 137 mEq/L (ref 135–145)
Total Bilirubin: 0.4 mg/dL (ref 0.3–1.2)

## 2012-09-18 MED ORDER — DIPHENHYDRAMINE HCL 50 MG/ML IJ SOLN
50.0000 mg | Freq: Once | INTRAMUSCULAR | Status: AC
  Administered 2012-09-18: 50 mg via INTRAVENOUS

## 2012-09-18 MED ORDER — FAMOTIDINE IN NACL 20-0.9 MG/50ML-% IV SOLN
20.0000 mg | Freq: Once | INTRAVENOUS | Status: AC
  Administered 2012-09-18: 20 mg via INTRAVENOUS

## 2012-09-18 MED ORDER — DEXAMETHASONE SODIUM PHOSPHATE 4 MG/ML IJ SOLN
20.0000 mg | Freq: Once | INTRAMUSCULAR | Status: AC
  Administered 2012-09-18: 20 mg via INTRAVENOUS

## 2012-09-18 MED ORDER — SODIUM CHLORIDE 0.9 % IV SOLN
Freq: Once | INTRAVENOUS | Status: AC
  Administered 2012-09-18: 12:00:00 via INTRAVENOUS

## 2012-09-18 MED ORDER — SODIUM CHLORIDE 0.9 % IV SOLN
70.0000 mg/m2 | Freq: Once | INTRAVENOUS | Status: AC
  Administered 2012-09-18: 168 mg via INTRAVENOUS
  Filled 2012-09-18: qty 28

## 2012-09-18 NOTE — Patient Instructions (Addendum)
[facility] Discharge Instructions for Patients Receiving Chemotherapy  Today you received the following chemotherapy agent Taxol.  To help prevent nausea and vomiting after your treatment, we encourage you to take your nausea medication. Begin taking your nausea medication as often as prescribed for by Dr. Arbutus Ped.    If you develop nausea and vomiting that is not controlled by your nausea medication, call the clinic. If it is after clinic hours your family physician or the after hours number for the clinic or go to the Emergency Department.   BELOW ARE SYMPTOMS THAT SHOULD BE REPORTED IMMEDIATELY:  *FEVER GREATER THAN 100.5 F  *CHILLS WITH OR WITHOUT FEVER  NAUSEA AND VOMITING THAT IS NOT CONTROLLED WITH YOUR NAUSEA MEDICATION  *UNUSUAL SHORTNESS OF BREATH  *UNUSUAL BRUISING OR BLEEDING  TENDERNESS IN MOUTH AND THROAT WITH OR WITHOUT PRESENCE OF ULCERS  *URINARY PROBLEMS  *BOWEL PROBLEMS  UNUSUAL RASH Items with * indicate a potential emergency and should be followed up as soon as possible.  One of the nurses will contact you 24 hours after your treatment. Please let the nurse know about any problems that you may have experienced. Feel free to call the clinic you have any questions or concerns. The clinic phone number is [phone].   I have been informed and understand all the instructions given to me. I know to contact the clinic, my physician, or go to the Emergency Department if any problems should occur. I do not have any questions at this time, but understand that I may call the clinic during office hours   should I have any questions or need assistance in obtaining follow up care.    __________________________________________  _____________  __________ Signature of Patient or Authorized Representative            Date                   Time    __________________________________________ Nurse's Signature

## 2012-09-25 ENCOUNTER — Other Ambulatory Visit (HOSPITAL_BASED_OUTPATIENT_CLINIC_OR_DEPARTMENT_OTHER): Payer: Medicare Other | Admitting: Lab

## 2012-09-25 ENCOUNTER — Ambulatory Visit (HOSPITAL_BASED_OUTPATIENT_CLINIC_OR_DEPARTMENT_OTHER): Payer: Medicare Other

## 2012-09-25 VITALS — BP 107/68 | HR 99 | Temp 98.7°F

## 2012-09-25 DIAGNOSIS — C7A1 Malignant poorly differentiated neuroendocrine tumors: Secondary | ICD-10-CM

## 2012-09-25 DIAGNOSIS — C3491 Malignant neoplasm of unspecified part of right bronchus or lung: Secondary | ICD-10-CM

## 2012-09-25 DIAGNOSIS — Z5111 Encounter for antineoplastic chemotherapy: Secondary | ICD-10-CM

## 2012-09-25 LAB — CBC WITH DIFFERENTIAL/PLATELET
Basophils Absolute: 0 10*3/uL (ref 0.0–0.1)
Eosinophils Absolute: 0 10*3/uL (ref 0.0–0.5)
HCT: 29.3 % — ABNORMAL LOW (ref 38.4–49.9)
HGB: 10 g/dL — ABNORMAL LOW (ref 13.0–17.1)
LYMPH%: 41 % (ref 14.0–49.0)
MCV: 105 fL — ABNORMAL HIGH (ref 79.3–98.0)
MONO#: 0.2 10*3/uL (ref 0.1–0.9)
MONO%: 5.1 % (ref 0.0–14.0)
NEUT#: 1.6 10*3/uL (ref 1.5–6.5)
NEUT%: 52.9 % (ref 39.0–75.0)
Platelets: 136 10*3/uL — ABNORMAL LOW (ref 140–400)
RBC: 2.79 10*6/uL — ABNORMAL LOW (ref 4.20–5.82)
WBC: 3 10*3/uL — ABNORMAL LOW (ref 4.0–10.3)

## 2012-09-25 LAB — COMPREHENSIVE METABOLIC PANEL (CC13)
ALT: 14 U/L (ref 0–55)
AST: 17 U/L (ref 5–34)
Alkaline Phosphatase: 80 U/L (ref 40–150)
Calcium: 9.9 mg/dL (ref 8.4–10.4)
Creatinine: 2 mg/dL — ABNORMAL HIGH (ref 0.7–1.3)
Sodium: 141 mEq/L (ref 136–145)

## 2012-09-25 MED ORDER — DEXAMETHASONE SODIUM PHOSPHATE 4 MG/ML IJ SOLN
20.0000 mg | Freq: Once | INTRAMUSCULAR | Status: AC
  Administered 2012-09-25: 20 mg via INTRAVENOUS

## 2012-09-25 MED ORDER — FAMOTIDINE IN NACL 20-0.9 MG/50ML-% IV SOLN
20.0000 mg | Freq: Once | INTRAVENOUS | Status: AC
  Administered 2012-09-25: 20 mg via INTRAVENOUS

## 2012-09-25 MED ORDER — PACLITAXEL CHEMO INJECTION 300 MG/50ML
70.0000 mg/m2 | Freq: Once | INTRAVENOUS | Status: AC
  Administered 2012-09-25: 168 mg via INTRAVENOUS
  Filled 2012-09-25: qty 28

## 2012-09-25 MED ORDER — SODIUM CHLORIDE 0.9 % IV SOLN
Freq: Once | INTRAVENOUS | Status: AC
  Administered 2012-09-25: 11:00:00 via INTRAVENOUS

## 2012-09-25 MED ORDER — DIPHENHYDRAMINE HCL 50 MG/ML IJ SOLN
50.0000 mg | Freq: Once | INTRAMUSCULAR | Status: AC
  Administered 2012-09-25: 50 mg via INTRAVENOUS

## 2012-10-01 ENCOUNTER — Encounter: Payer: Self-pay | Admitting: Pharmacist

## 2012-10-02 ENCOUNTER — Telehealth: Payer: Self-pay | Admitting: Internal Medicine

## 2012-10-02 ENCOUNTER — Ambulatory Visit (HOSPITAL_BASED_OUTPATIENT_CLINIC_OR_DEPARTMENT_OTHER): Payer: Medicare Other | Admitting: Physician Assistant

## 2012-10-02 ENCOUNTER — Other Ambulatory Visit (HOSPITAL_BASED_OUTPATIENT_CLINIC_OR_DEPARTMENT_OTHER): Payer: Medicare Other | Admitting: Lab

## 2012-10-02 ENCOUNTER — Ambulatory Visit (HOSPITAL_BASED_OUTPATIENT_CLINIC_OR_DEPARTMENT_OTHER): Payer: Medicare Other

## 2012-10-02 VITALS — BP 126/70 | HR 111 | Temp 98.1°F | Resp 20 | Ht 73.0 in | Wt 241.1 lb

## 2012-10-02 DIAGNOSIS — C3430 Malignant neoplasm of lower lobe, unspecified bronchus or lung: Secondary | ICD-10-CM

## 2012-10-02 DIAGNOSIS — C3491 Malignant neoplasm of unspecified part of right bronchus or lung: Secondary | ICD-10-CM

## 2012-10-02 DIAGNOSIS — C3490 Malignant neoplasm of unspecified part of unspecified bronchus or lung: Secondary | ICD-10-CM

## 2012-10-02 DIAGNOSIS — Z5111 Encounter for antineoplastic chemotherapy: Secondary | ICD-10-CM

## 2012-10-02 LAB — CBC WITH DIFFERENTIAL/PLATELET
Basophils Absolute: 0 10*3/uL (ref 0.0–0.1)
EOS%: 0.7 % (ref 0.0–7.0)
HCT: 28.3 % — ABNORMAL LOW (ref 38.4–49.9)
HGB: 9.7 g/dL — ABNORMAL LOW (ref 13.0–17.1)
LYMPH%: 43.1 % (ref 14.0–49.0)
MCH: 36.3 pg — ABNORMAL HIGH (ref 27.2–33.4)
MCHC: 34.3 g/dL (ref 32.0–36.0)
MCV: 106 fL — ABNORMAL HIGH (ref 79.3–98.0)
MONO%: 7.1 % (ref 0.0–14.0)
NEUT%: 48.8 % (ref 39.0–75.0)
Platelets: 161 10*3/uL (ref 140–400)

## 2012-10-02 LAB — COMPREHENSIVE METABOLIC PANEL (CC13)
AST: 19 U/L (ref 5–34)
BUN: 22.8 mg/dL (ref 7.0–26.0)
Calcium: 10.3 mg/dL (ref 8.4–10.4)
Chloride: 106 mEq/L (ref 98–107)
Creatinine: 1.8 mg/dL — ABNORMAL HIGH (ref 0.7–1.3)
Total Bilirubin: 0.32 mg/dL (ref 0.20–1.20)

## 2012-10-02 MED ORDER — DIPHENHYDRAMINE HCL 50 MG/ML IJ SOLN
50.0000 mg | Freq: Once | INTRAMUSCULAR | Status: AC
  Administered 2012-10-02: 50 mg via INTRAVENOUS

## 2012-10-02 MED ORDER — DEXAMETHASONE SODIUM PHOSPHATE 4 MG/ML IJ SOLN
20.0000 mg | Freq: Once | INTRAMUSCULAR | Status: AC
  Administered 2012-10-02: 20 mg via INTRAVENOUS

## 2012-10-02 MED ORDER — SODIUM CHLORIDE 0.9 % IV SOLN
70.0000 mg/m2 | Freq: Once | INTRAVENOUS | Status: AC
  Administered 2012-10-02: 168 mg via INTRAVENOUS
  Filled 2012-10-02: qty 28

## 2012-10-02 MED ORDER — PACLITAXEL CHEMO INJECTION 300 MG/50ML: 70.0000 mg/m2 | Freq: Once | INTRAVENOUS | Status: DC

## 2012-10-02 MED ORDER — FAMOTIDINE IN NACL 20-0.9 MG/50ML-% IV SOLN
20.0000 mg | Freq: Once | INTRAVENOUS | Status: AC
  Administered 2012-10-02: 20 mg via INTRAVENOUS

## 2012-10-02 MED ORDER — SODIUM CHLORIDE 0.9 % IV SOLN
Freq: Once | INTRAVENOUS | Status: AC
  Administered 2012-10-02: 12:00:00 via INTRAVENOUS

## 2012-10-02 NOTE — Telephone Encounter (Signed)
gv pt appt schedule for April and May. °

## 2012-10-02 NOTE — Patient Instructions (Addendum)
Continue with weekly labs and chemotherapy is scheduled Followup with Dr. Arbutus Ped in 2 weeks for another symptom management visit

## 2012-10-02 NOTE — Patient Instructions (Signed)
[facility] Discharge Instructions for Patients Receiving Chemotherapy  Today you received the following chemotherapy agents Taxol  To help prevent nausea and vomiting after your treatment, we encourage you to take your nausea medication as prescribed.  If you develop nausea and vomiting that is not controlled by your nausea medication, call the clinic. If it is after clinic hours your family physician or the after hours number for the clinic or go to the Emergency Department.   BELOW ARE SYMPTOMS THAT SHOULD BE REPORTED IMMEDIATELY:  *FEVER GREATER THAN 100.5 F  *CHILLS WITH OR WITHOUT FEVER  NAUSEA AND VOMITING THAT IS NOT CONTROLLED WITH YOUR NAUSEA MEDICATION  *UNUSUAL SHORTNESS OF BREATH  *UNUSUAL BRUISING OR BLEEDING  TENDERNESS IN MOUTH AND THROAT WITH OR WITHOUT PRESENCE OF ULCERS  *URINARY PROBLEMS  *BOWEL PROBLEMS  UNUSUAL RASH Items with * indicate a potential emergency and should be followed up as soon as possible.  One of the nurses will contact you 24 hours after your treatment. Please let the nurse know about any problems that you may have experienced. Feel free to call the clinic you have any questions or concerns. The clinic phone number is [phone].   I have been informed and understand all the instructions given to me. I know to contact the clinic, my physician, or go to the Emergency Department if any problems should occur. I do not have any questions at this time, but understand that I may call the clinic during office hours   should I have any questions or need assistance in obtaining follow up care.    __________________________________________  _____________  __________ Signature of Patient or Authorized Representative            Date                   Time    __________________________________________ Nurse's Signature    

## 2012-10-05 NOTE — Progress Notes (Signed)
[facility] [phone]) [phone]   Fax:([phone]) [phone]  OFFICE PROGRESS NOTE  Francis Inch, MD [phone] [address] [place] [address]  DIAGNOSIS: Extensive stage small cell lung cancer diagnosed in August of 2013.   PRIOR THERAPY:  1) Systemic chemotherapy in the form of carboplatin for an AUC of 5 given on day 1 and etoposide 100 mg per meter squared given on days 1, 2 and 3 with Neulasta support given on day 4. Status post 2 cycle, discontinued today secondary to disease progression. 2) Systemic chemotherapy with cisplatin 30 mg/M2 and irinotecan 65 mg/M2 on days 1 and 8 every 3 weeks. Status post 5 cycles, last dose was given 07/09/2012 discontinued secondary to disease progression.  CURRENT THERAPY:  Systemic chemotherapy with paclitaxel 70 mg/M2 given weekly. Status post 4 weekly cycles  INTERVAL HISTORY: [name] 72 y.o. male returns to the clinic today for followup visit. The patient is feeling fine today with no specific complaints except for decreased energy and a cough and runny nose not associated with fever or chills.. He denied having any significant chest pain, cough or hemoptysis. He denied having any weight loss or night sweats. He has no nausea or vomiting. He reports a good appetite and bowels moving regularly. He does have some shortness of breath with exertion.  MEDICAL HISTORY: Past Medical History  Diagnosis Date  . Hypertension   . ALLERGIC RHINITIS   . Malignant neoplasm of bronchus and lung, unspecified site 01/23/2012    ALLERGIES:  has No Known Allergies.  MEDICATIONS:  Current Outpatient Prescriptions  Medication Sig Dispense Refill  . aspirin EC 325 MG tablet Take 1 tablet (325 mg total) by mouth 2 (two) times daily.  30 tablet  0  . fluconazole (DIFLUCAN) 100 MG tablet Take 2 tablets by mouth on day one, then take 1 tablet by mouth daily until completed  16 tablet  0  . folic acid (FOLVITE) 1 MG tablet Take 1 tablet (1 mg  total) by mouth daily.  30 tablet  1  . olmesartan-hydrochlorothiazide (BENICAR HCT) 40-12.5 MG per tablet Take 0.5 tablets by mouth daily.       Marland Kitchen oxyCODONE (OXY IR/ROXICODONE) 5 MG immediate release tablet Take 1 tablet (5 mg total) by mouth every 6 (six) hours as needed.  60 tablet  0  . vitamin B-12 (CYANOCOBALAMIN) 1000 MCG tablet Take 1,000 mcg by mouth daily.       No current facility-administered medications for this visit.    SURGICAL HISTORY:  Past Surgical History  Procedure Laterality Date  . Wrist surgery  1961    right wrist  . Orif ankle fracture  05/07/2012    Procedure: OPEN REDUCTION INTERNAL FIXATION (ORIF) ANKLE FRACTURE;  Surgeon: Mable Paris, MD;  Location: [facility];  Service: Orthopedics;  Laterality: Right;    REVIEW OF SYSTEMS:  A comprehensive review of systems was negative except for: Constitutional: positive for fatigue Respiratory: positive for cough and dyspnea on exertion   PHYSICAL EXAMINATION: General appearance: alert, cooperative and no distress Head: Normocephalic, without obvious abnormality, atraumatic Neck: no adenopathy Lymph nodes: Cervical, supraclavicular, and axillary nodes normal. Resp: clear to auscultation bilaterally Cardio: regular rate and rhythm, S1, S2 normal, no murmur, click, rub or gallop GI: soft, non-tender; bowel sounds normal; no masses,  no organomegaly Extremities: extremities normal, atraumatic, no cyanosis or edema Neurologic: Alert and oriented X 3, normal strength and tone. Normal symmetric reflexes. Normal coordination and  gait  ECOG PERFORMANCE STATUS: 1 - Symptomatic but completely ambulatory  Blood pressure 126/70, pulse 111, temperature 98.1 F (36.7 C), temperature source Oral, resp. rate 20, height 6\' 1"  (1.854 m), weight 241 lb 1.6 oz (109.362 kg).  LABORATORY DATA: Lab Results  Component Value Date   WBC 3.0* 10/02/2012   HGB 9.7* 10/02/2012   HCT 28.3* 10/02/2012   MCV 106.0* 10/02/2012   PLT  161 10/02/2012      Chemistry      Component Value Date/Time   NA 141 10/02/2012 0944   NA 137 09/18/2012 1250   K 4.2 10/02/2012 0944   K 4.2 09/18/2012 1250   CL 106 10/02/2012 0944   CL 104 09/18/2012 1250   CO2 25 10/02/2012 0944   CO2 27 09/18/2012 1250   BUN 22.8 10/02/2012 0944   BUN 34* 09/18/2012 1250   CREATININE 1.8* 10/02/2012 0944   CREATININE 2.06* 09/18/2012 1250      Component Value Date/Time   CALCIUM 10.3 10/02/2012 0944   CALCIUM 10.2 09/18/2012 1250   ALKPHOS 93 10/02/2012 0944   ALKPHOS 79 09/18/2012 1250   AST 19 10/02/2012 0944   AST 14 09/18/2012 1250   ALT 17 10/02/2012 0944   ALT 12 09/18/2012 1250   BILITOT 0.32 10/02/2012 0944   BILITOT 0.4 09/18/2012 1250       RADIOGRAPHIC STUDIES: Ct Chest Wo Contrast  08/27/2012  *RADIOLOGY REPORT*  Clinical Data:  Lung cancer, chemotherapy ongoing, right flank pain  CT CHEST, ABDOMEN AND PELVIS WITHOUT CONTRAST  Technique:  Multidetector CT imaging of the chest, abdomen and pelvis was performed following the standard protocol without IV contrast.  Comparison:  CT 06/08/2012   CT CHEST  Findings:  No axillary or supraclavicular lymphadenopathy.  No mediastinal or hilar lymphadenopathy.  No pericardial fluid.  Review of the lung parenchyma demonstrates interval enlargement of a pleural nodule within the right lateral hemithorax measuring 14 mm (image 32) increased from 9 mm on prior.  Nodular parenchymal lesion in a branching pattern extending from the right lower lobe measures 30 mm x 21 mm (image 41) increased from 17 x 13 mm on prior.  This pattern is concerning for lymphangitic spread of carcinoma.  Round nodule over the left hemi diaphragm measures 9 mm (image 45) unchanged from 9 mm on prior.  There is interval increase in size of right lower paratracheal lymph node measuring 17 mm short axis compared to 12 on prior. Subcarinal lymph node adjacent to the right lower lobe bronchus measures 19 mm increased from 9 mm on prior.  These lymph nodes  are not increased back to the level of the staging FDG PET scan 01/27/2012.  IMPRESSION:  1.  Increased linear nodular thickening extending from the right hilum into the right lower lobe is concerning for lymphangitic spread of carcinoma. 2. Enlarged nodular density in the right lateral pleural space is consistent with pleural spread of carcinoma. 3.  These lesions were present on comparison PET CT scan and hypermetabolic indicating no new disease but recurrence. 4.  Interval increase in adenopathy in the mediastinum back to the level of the staging PET scan.   CT ABDOMEN AND PELVIS  Findings:  Non-IV contrast images demonstrate no focal hepatic lesion.  The gallbladder, pancreas, spleen, adrenal glands, and kidneys are unchanged.  There are bilateral low-density renal cysts.  The stomach, small bowel, appendix, and cecum are normal.  The colon and rectosigmoid colon are normal.  No free fluid the  pelvis.  No pelvic lymphadenopathy. Retroperitoneal lymphadenopathy.  Review of  bone windows demonstrates no aggressive osseous lesions.  IMPRESSION:  1.  No evidence metastasis in the abdomen pelvis. 2.  Normal adrenal glands.   Original Report Authenticated By: Genevive Bi, M.D.     ASSESSMENT/PLAN: This is a very pleasant 72 years old African American male with extensive stage small cell lung cancer, status post second line chemotherapy with cisplatin and irinotecan but unfortunately the patient has evidence for disease progression on his recent scan. Patient was discussed with Dr. Arbutus Ped. He is currently being treated with paclitaxel at 70mg /M2 given weekly, status post 4 weekly cycles. He will continue with his weekly labs and weekly Taxol. He'll followup with Dr. Arbutus Ped in 2 weeks for another symptom management visit. Patient was cautioned to keep a close eye on his respiratory symptomatology and to notify us should he develop fever, chills or productive cough. Patient voiced understanding  Conni Slipper, PA-C   The patient was advised to call immediately if he has any concerning symptoms in the interval.  All questions were answered. The patient knows to call the clinic with any problems, questions or concerns. We can certainly see the patient much sooner if necessary.  I spent 20 minutes counseling the patient face to face. The total time spent in the appointment was 30 minutes.

## 2012-10-09 ENCOUNTER — Ambulatory Visit (HOSPITAL_BASED_OUTPATIENT_CLINIC_OR_DEPARTMENT_OTHER): Payer: Medicare Other

## 2012-10-09 ENCOUNTER — Other Ambulatory Visit (HOSPITAL_BASED_OUTPATIENT_CLINIC_OR_DEPARTMENT_OTHER): Payer: Medicare Other | Admitting: Lab

## 2012-10-09 VITALS — BP 113/71 | HR 87 | Temp 97.9°F | Resp 18

## 2012-10-09 DIAGNOSIS — Z5111 Encounter for antineoplastic chemotherapy: Secondary | ICD-10-CM

## 2012-10-09 DIAGNOSIS — C3430 Malignant neoplasm of lower lobe, unspecified bronchus or lung: Secondary | ICD-10-CM

## 2012-10-09 DIAGNOSIS — C3491 Malignant neoplasm of unspecified part of right bronchus or lung: Secondary | ICD-10-CM

## 2012-10-09 LAB — COMPREHENSIVE METABOLIC PANEL (CC13)
AST: 18 U/L (ref 5–34)
Alkaline Phosphatase: 82 U/L (ref 40–150)
BUN: 18 mg/dL (ref 7.0–26.0)
Creatinine: 1.7 mg/dL — ABNORMAL HIGH (ref 0.7–1.3)

## 2012-10-09 LAB — CBC WITH DIFFERENTIAL/PLATELET
Basophils Absolute: 0 10*3/uL (ref 0.0–0.1)
EOS%: 0.6 % (ref 0.0–7.0)
HCT: 27.3 % — ABNORMAL LOW (ref 38.4–49.9)
HGB: 9.3 g/dL — ABNORMAL LOW (ref 13.0–17.1)
LYMPH%: 38.9 % (ref 14.0–49.0)
MCH: 36.3 pg — ABNORMAL HIGH (ref 27.2–33.4)
MCV: 106.6 fL — ABNORMAL HIGH (ref 79.3–98.0)
MONO%: 3.5 % (ref 0.0–14.0)
NEUT%: 56.1 % (ref 39.0–75.0)

## 2012-10-09 MED ORDER — DIPHENHYDRAMINE HCL 50 MG/ML IJ SOLN
50.0000 mg | Freq: Once | INTRAMUSCULAR | Status: AC
  Administered 2012-10-09: 50 mg via INTRAVENOUS

## 2012-10-09 MED ORDER — DEXAMETHASONE SODIUM PHOSPHATE 4 MG/ML IJ SOLN
20.0000 mg | Freq: Once | INTRAMUSCULAR | Status: AC
  Administered 2012-10-09: 20 mg via INTRAVENOUS

## 2012-10-09 MED ORDER — SODIUM CHLORIDE 0.9 % IV SOLN
70.0000 mg/m2 | Freq: Once | INTRAVENOUS | Status: AC
  Administered 2012-10-09: 168 mg via INTRAVENOUS
  Filled 2012-10-09: qty 28

## 2012-10-09 MED ORDER — FAMOTIDINE IN NACL 20-0.9 MG/50ML-% IV SOLN
20.0000 mg | Freq: Once | INTRAVENOUS | Status: AC
  Administered 2012-10-09: 20 mg via INTRAVENOUS

## 2012-10-09 MED ORDER — SODIUM CHLORIDE 0.9 % IV SOLN
Freq: Once | INTRAVENOUS | Status: AC
  Administered 2012-10-09: 10:00:00 via INTRAVENOUS

## 2012-10-16 ENCOUNTER — Ambulatory Visit (HOSPITAL_BASED_OUTPATIENT_CLINIC_OR_DEPARTMENT_OTHER): Payer: Medicare Other

## 2012-10-16 ENCOUNTER — Ambulatory Visit (HOSPITAL_BASED_OUTPATIENT_CLINIC_OR_DEPARTMENT_OTHER): Payer: Medicare Other | Admitting: Internal Medicine

## 2012-10-16 ENCOUNTER — Encounter: Payer: Self-pay | Admitting: Internal Medicine

## 2012-10-16 ENCOUNTER — Telehealth: Payer: Self-pay | Admitting: Internal Medicine

## 2012-10-16 ENCOUNTER — Other Ambulatory Visit (HOSPITAL_BASED_OUTPATIENT_CLINIC_OR_DEPARTMENT_OTHER): Payer: Medicare Other | Admitting: Lab

## 2012-10-16 VITALS — BP 136/73 | HR 94 | Temp 98.4°F | Resp 18 | Ht 73.0 in | Wt 247.8 lb

## 2012-10-16 DIAGNOSIS — C3490 Malignant neoplasm of unspecified part of unspecified bronchus or lung: Secondary | ICD-10-CM

## 2012-10-16 DIAGNOSIS — Z5111 Encounter for antineoplastic chemotherapy: Secondary | ICD-10-CM

## 2012-10-16 DIAGNOSIS — C3430 Malignant neoplasm of lower lobe, unspecified bronchus or lung: Secondary | ICD-10-CM

## 2012-10-16 DIAGNOSIS — C3491 Malignant neoplasm of unspecified part of right bronchus or lung: Secondary | ICD-10-CM

## 2012-10-16 LAB — COMPREHENSIVE METABOLIC PANEL (CC13)
ALT: 13 U/L (ref 0–55)
Albumin: 3.1 g/dL — ABNORMAL LOW (ref 3.5–5.0)
CO2: 28 mEq/L (ref 22–29)
Calcium: 9.6 mg/dL (ref 8.4–10.4)
Chloride: 107 mEq/L (ref 98–107)
Creatinine: 1.6 mg/dL — ABNORMAL HIGH (ref 0.7–1.3)
Potassium: 3.9 mEq/L (ref 3.5–5.1)
Total Protein: 6.4 g/dL (ref 6.4–8.3)

## 2012-10-16 LAB — CBC WITH DIFFERENTIAL/PLATELET
Basophils Absolute: 0 10*3/uL (ref 0.0–0.1)
EOS%: 0.5 % (ref 0.0–7.0)
Eosinophils Absolute: 0 10*3/uL (ref 0.0–0.5)
HCT: 27 % — ABNORMAL LOW (ref 38.4–49.9)
HGB: 9.2 g/dL — ABNORMAL LOW (ref 13.0–17.1)
MCH: 36.7 pg — ABNORMAL HIGH (ref 27.2–33.4)
MCV: 107.6 fL — ABNORMAL HIGH (ref 79.3–98.0)
MONO%: 5.6 % (ref 0.0–14.0)
NEUT%: 61.3 % (ref 39.0–75.0)
lymph#: 1.3 10*3/uL (ref 0.9–3.3)

## 2012-10-16 MED ORDER — SODIUM CHLORIDE 0.9 % IV SOLN
70.0000 mg/m2 | Freq: Once | INTRAVENOUS | Status: AC
  Administered 2012-10-16: 168 mg via INTRAVENOUS
  Filled 2012-10-16: qty 28

## 2012-10-16 MED ORDER — SODIUM CHLORIDE 0.9 % IV SOLN
Freq: Once | INTRAVENOUS | Status: AC
  Administered 2012-10-16: 11:00:00 via INTRAVENOUS

## 2012-10-16 MED ORDER — DIPHENHYDRAMINE HCL 50 MG/ML IJ SOLN
50.0000 mg | Freq: Once | INTRAMUSCULAR | Status: AC
  Administered 2012-10-16: 50 mg via INTRAVENOUS

## 2012-10-16 MED ORDER — DEXAMETHASONE SODIUM PHOSPHATE 20 MG/5ML IJ SOLN
20.0000 mg | Freq: Once | INTRAMUSCULAR | Status: AC
  Administered 2012-10-16: 20 mg via INTRAVENOUS

## 2012-10-16 MED ORDER — FAMOTIDINE IN NACL 20-0.9 MG/50ML-% IV SOLN
20.0000 mg | Freq: Once | INTRAVENOUS | Status: AC
  Administered 2012-10-16: 20 mg via INTRAVENOUS

## 2012-10-16 NOTE — Patient Instructions (Addendum)
[facility] Discharge Instructions for Patients Receiving Chemotherapy  Today you received the following chemotherapy agents Taxol.  To help prevent nausea and vomiting after your treatment, we encourage you to take your nausea medication as prescribed.   If you develop nausea and vomiting that is not controlled by your nausea medication, call the clinic. If it is after clinic hours your family physician or the after hours number for the clinic or go to the Emergency Department.   BELOW ARE SYMPTOMS THAT SHOULD BE REPORTED IMMEDIATELY:  *FEVER GREATER THAN 100.5 F  *CHILLS WITH OR WITHOUT FEVER  NAUSEA AND VOMITING THAT IS NOT CONTROLLED WITH YOUR NAUSEA MEDICATION  *UNUSUAL SHORTNESS OF BREATH  *UNUSUAL BRUISING OR BLEEDING  TENDERNESS IN MOUTH AND THROAT WITH OR WITHOUT PRESENCE OF ULCERS  *URINARY PROBLEMS  *BOWEL PROBLEMS  UNUSUAL RASH Items with * indicate a potential emergency and should be followed up as soon as possible.  Feel free to call the clinic you have any questions or concerns. The clinic phone number is [phone].   I have been informed and understand all the instructions given to me. I know to contact the clinic, my physician, or go to the Emergency Department if any problems should occur. I do not have any questions at this time, but understand that I may call the clinic during office hours   should I have any questions or need assistance in obtaining follow up care.    __________________________________________  _____________  __________ Signature of Patient or Authorized Representative            Date                   Time    __________________________________________ Nurse's Signature

## 2012-10-16 NOTE — Progress Notes (Signed)
[facility] Telephone:(336) [phone]   Fax:([phone]  OFFICE PROGRESS NOTE  Francis Inch, MD [phone] [place] [address]  DIAGNOSIS: Extensive stage small cell lung cancer diagnosed in August of 2013.   PRIOR THERAPY:  1) Systemic chemotherapy in the form of carboplatin for an AUC of 5 given on day 1 and etoposide 100 mg per meter squared given on days 1, 2 and 3 with Neulasta support given on day 4. Status post 2 cycle, discontinued today secondary to disease progression.  2) Systemic chemotherapy with cisplatin 30 mg/M2 and irinotecan 65 mg/M2 on days 1 and 8 every 3 weeks. Status post 5 cycles, last dose was given 07/09/2012 discontinued secondary to disease progression.   CURRENT THERAPY: Systemic chemotherapy with paclitaxel 70 mg/M2 given weekly. Status post 6 weekly cycles  INTERVAL HISTORY: [name] 72 y.o. male returns to the clinic today for followup visit. The patient is feeling fine today with no specific complaints. He is tolerating his treatment with weekly paclitaxel fairly well with no significant adverse effects. He denied having any significant peripheral neuropathy. He has no nausea or vomiting. He has no chest pain, shortness breath, cough or hemoptysis. The patient denied having any significant weight loss or night sweats.   MEDICAL HISTORY: Past Medical History  Diagnosis Date  . Hypertension   . ALLERGIC RHINITIS   . Malignant neoplasm of bronchus and lung, unspecified site 01/23/2012    ALLERGIES:  has No Known Allergies.  MEDICATIONS:  Current Outpatient Prescriptions  Medication Sig Dispense Refill  . olmesartan-hydrochlorothiazide (BENICAR HCT) 40-12.5 MG per tablet Take 0.5 tablets by mouth daily.       Marland Kitchen oxyCODONE (OXY IR/ROXICODONE) 5 MG immediate release tablet Take 1 tablet (5 mg total) by mouth every 6 (six) hours as needed.  60 tablet  0  . vitamin B-12 (CYANOCOBALAMIN) 1000 MCG tablet Take 1,000 mcg by  mouth daily.       No current facility-administered medications for this visit.    SURGICAL HISTORY:  Past Surgical History  Procedure Laterality Date  . Wrist surgery  1961    right wrist  . Orif ankle fracture  05/07/2012    Procedure: OPEN REDUCTION INTERNAL FIXATION (ORIF) ANKLE FRACTURE;  Surgeon: Mable Paris, MD;  Location: [facility];  Service: Orthopedics;  Laterality: Right;    REVIEW OF SYSTEMS:  A comprehensive review of systems was negative except for: Constitutional: positive for fatigue   PHYSICAL EXAMINATION: General appearance: alert, cooperative and no distress Head: Normocephalic, without obvious abnormality, atraumatic Neck: no adenopathy Lymph nodes: Cervical, supraclavicular, and axillary nodes normal. Resp: clear to auscultation bilaterally Cardio: regular rate and rhythm, S1, S2 normal, no murmur, click, rub or gallop GI: soft, non-tender; bowel sounds normal; no masses,  no organomegaly Extremities: extremities normal, atraumatic, no cyanosis or edema  ECOG PERFORMANCE STATUS: 1 - Symptomatic but completely ambulatory  Blood pressure 136/73, pulse 94, temperature 98.4 F (36.9 C), temperature source Oral, resp. rate 18, height 6\' 1"  (1.854 m), weight 247 lb 12.8 oz (112.401 kg).  LABORATORY DATA: Lab Results  Component Value Date   WBC 4.1 10/16/2012   HGB 9.2* 10/16/2012   HCT 27.0* 10/16/2012   MCV 107.6* 10/16/2012   PLT 154 10/16/2012      Chemistry      Component Value Date/Time   NA 143 10/09/2012 0859   NA 137 09/18/2012 1250   K 3.9 10/09/2012 0859  K 4.2 09/18/2012 1250   CL 107 10/09/2012 0859   CL 104 09/18/2012 1250   CO2 29 10/09/2012 0859   CO2 27 09/18/2012 1250   BUN 18.0 10/09/2012 0859   BUN 34* 09/18/2012 1250   CREATININE 1.7* 10/09/2012 0859   CREATININE 2.06* 09/18/2012 1250      Component Value Date/Time   CALCIUM 9.8 10/09/2012 0859   CALCIUM 10.2 09/18/2012 1250   ALKPHOS 82 10/09/2012 0859   ALKPHOS 79 09/18/2012 1250   AST 18  10/09/2012 0859   AST 14 09/18/2012 1250   ALT 14 10/09/2012 0859   ALT 12 09/18/2012 1250   BILITOT 0.29 10/09/2012 0859   BILITOT 0.4 09/18/2012 1250       RADIOGRAPHIC STUDIES: No results found.  ASSESSMENT: This is a very pleasant 72 years old [place] American male with extensive stage small cell lung cancer currently on third line chemotherapy with weekly single agent paclitaxel is status post 6 cycles.   PLAN: The patient is doing fine today with no significant adverse effect of his chemotherapy. I recommended for him to continue on treatment with weekly paclitaxel.  I would see him back for followup visit in 2 weeks for reevaluation and management any adverse effect of his chemotherapy. He was advised to call immediately if he has any concerning symptoms in the interval. All questions were answered. The patient knows to call the clinic with any problems, questions or concerns. We can certainly see the patient much sooner if necessary.

## 2012-10-16 NOTE — Patient Instructions (Signed)
Continue chemotherapy with paclitaxel as scheduled. Followup in 2 weeks

## 2012-10-16 NOTE — Telephone Encounter (Signed)
gv and pritned appt sched and avs for pt...emailed mb to move tx on 5.20.14 after MD visit

## 2012-10-23 ENCOUNTER — Other Ambulatory Visit (HOSPITAL_BASED_OUTPATIENT_CLINIC_OR_DEPARTMENT_OTHER): Payer: Medicare Other | Admitting: Lab

## 2012-10-23 ENCOUNTER — Ambulatory Visit (HOSPITAL_BASED_OUTPATIENT_CLINIC_OR_DEPARTMENT_OTHER): Payer: Medicare Other

## 2012-10-23 VITALS — BP 144/73 | HR 96 | Temp 97.6°F | Resp 18

## 2012-10-23 DIAGNOSIS — C3430 Malignant neoplasm of lower lobe, unspecified bronchus or lung: Secondary | ICD-10-CM

## 2012-10-23 DIAGNOSIS — C3491 Malignant neoplasm of unspecified part of right bronchus or lung: Secondary | ICD-10-CM

## 2012-10-23 DIAGNOSIS — Z5111 Encounter for antineoplastic chemotherapy: Secondary | ICD-10-CM

## 2012-10-23 DIAGNOSIS — C3490 Malignant neoplasm of unspecified part of unspecified bronchus or lung: Secondary | ICD-10-CM

## 2012-10-23 LAB — CBC WITH DIFFERENTIAL/PLATELET
BASO%: 0.5 % (ref 0.0–2.0)
EOS%: 0.2 % (ref 0.0–7.0)
HCT: 27.1 % — ABNORMAL LOW (ref 38.4–49.9)
MCH: 36.3 pg — ABNORMAL HIGH (ref 27.2–33.4)
MCHC: 33.2 g/dL (ref 32.0–36.0)
MONO#: 0.3 10*3/uL (ref 0.1–0.9)
RBC: 2.48 10*6/uL — ABNORMAL LOW (ref 4.20–5.82)
RDW: 15.5 % — ABNORMAL HIGH (ref 11.0–14.6)
WBC: 4.1 10*3/uL (ref 4.0–10.3)
lymph#: 1.1 10*3/uL (ref 0.9–3.3)
nRBC: 3 % — ABNORMAL HIGH (ref 0–0)

## 2012-10-23 LAB — COMPREHENSIVE METABOLIC PANEL (CC13)
ALT: 19 U/L (ref 0–55)
AST: 20 U/L (ref 5–34)
Alkaline Phosphatase: 92 U/L (ref 40–150)
Sodium: 141 mEq/L (ref 136–145)
Total Bilirubin: 0.43 mg/dL (ref 0.20–1.20)
Total Protein: 7.4 g/dL (ref 6.4–8.3)

## 2012-10-23 MED ORDER — DEXAMETHASONE SODIUM PHOSPHATE 20 MG/5ML IJ SOLN
20.0000 mg | Freq: Once | INTRAMUSCULAR | Status: AC
  Administered 2012-10-23: 20 mg via INTRAVENOUS

## 2012-10-23 MED ORDER — SODIUM CHLORIDE 0.9 % IV SOLN
Freq: Once | INTRAVENOUS | Status: AC
  Administered 2012-10-23: 10:00:00 via INTRAVENOUS

## 2012-10-23 MED ORDER — FAMOTIDINE IN NACL 20-0.9 MG/50ML-% IV SOLN
20.0000 mg | Freq: Once | INTRAVENOUS | Status: AC
  Administered 2012-10-23: 20 mg via INTRAVENOUS

## 2012-10-23 MED ORDER — SODIUM CHLORIDE 0.9 % IV SOLN
70.0000 mg/m2 | Freq: Once | INTRAVENOUS | Status: AC
  Administered 2012-10-23: 168 mg via INTRAVENOUS
  Filled 2012-10-23: qty 28

## 2012-10-23 MED ORDER — DIPHENHYDRAMINE HCL 50 MG/ML IJ SOLN
50.0000 mg | Freq: Once | INTRAMUSCULAR | Status: AC
  Administered 2012-10-23: 50 mg via INTRAVENOUS

## 2012-10-23 NOTE — Patient Instructions (Addendum)
[facility] Discharge Instructions for Patients Receiving Chemotherapy  Today you received the following chemotherapy agents: taxol  To help prevent nausea and vomiting after your treatment, we encourage you to take your nausea medication.  Take it as often as prescribed.     If you develop nausea and vomiting that is not controlled by your nausea medication, call the clinic. If it is after clinic hours your family physician or the after hours number for the clinic or go to the Emergency Department.   BELOW ARE SYMPTOMS THAT SHOULD BE REPORTED IMMEDIATELY:  *FEVER GREATER THAN 100.5 F  *CHILLS WITH OR WITHOUT FEVER  NAUSEA AND VOMITING THAT IS NOT CONTROLLED WITH YOUR NAUSEA MEDICATION  *UNUSUAL SHORTNESS OF BREATH  *UNUSUAL BRUISING OR BLEEDING  TENDERNESS IN MOUTH AND THROAT WITH OR WITHOUT PRESENCE OF ULCERS  *URINARY PROBLEMS  *BOWEL PROBLEMS  UNUSUAL RASH Items with * indicate a potential emergency and should be followed up as soon as possible.  Feel free to call the clinic you have any questions or concerns. The clinic phone number is [phone].   I have been informed and understand all the instructions given to me. I know to contact the clinic, my physician, or go to the Emergency Department if any problems should occur. I do not have any questions at this time, but understand that I may call the clinic during office hours   should I have any questions or need assistance in obtaining follow up care.    __________________________________________  _____________  __________ Signature of Patient or Authorized Representative            Date                   Time    __________________________________________ Nurse's Signature    

## 2012-10-30 ENCOUNTER — Other Ambulatory Visit (HOSPITAL_BASED_OUTPATIENT_CLINIC_OR_DEPARTMENT_OTHER): Payer: Medicare Other | Admitting: Lab

## 2012-10-30 ENCOUNTER — Telehealth: Payer: Self-pay | Admitting: Internal Medicine

## 2012-10-30 ENCOUNTER — Ambulatory Visit (HOSPITAL_BASED_OUTPATIENT_CLINIC_OR_DEPARTMENT_OTHER): Payer: Medicare Other

## 2012-10-30 ENCOUNTER — Other Ambulatory Visit: Payer: Medicare Other

## 2012-10-30 ENCOUNTER — Encounter: Payer: Self-pay | Admitting: Internal Medicine

## 2012-10-30 ENCOUNTER — Ambulatory Visit (HOSPITAL_BASED_OUTPATIENT_CLINIC_OR_DEPARTMENT_OTHER): Payer: Medicare Other | Admitting: Internal Medicine

## 2012-10-30 VITALS — BP 147/84 | HR 94 | Temp 97.1°F | Resp 18 | Ht 73.0 in | Wt 247.8 lb

## 2012-10-30 DIAGNOSIS — C3491 Malignant neoplasm of unspecified part of right bronchus or lung: Secondary | ICD-10-CM

## 2012-10-30 DIAGNOSIS — Z5111 Encounter for antineoplastic chemotherapy: Secondary | ICD-10-CM

## 2012-10-30 DIAGNOSIS — C3430 Malignant neoplasm of lower lobe, unspecified bronchus or lung: Secondary | ICD-10-CM

## 2012-10-30 LAB — CBC WITH DIFFERENTIAL/PLATELET
Eosinophils Absolute: 0 10*3/uL (ref 0.0–0.5)
LYMPH%: 30.1 % (ref 14.0–49.0)
MCH: 37.2 pg — ABNORMAL HIGH (ref 27.2–33.4)
MCHC: 34.1 g/dL (ref 32.0–36.0)
MCV: 109 fL — ABNORMAL HIGH (ref 79.3–98.0)
MONO%: 6.6 % (ref 0.0–14.0)
NEUT#: 2.9 10*3/uL (ref 1.5–6.5)
Platelets: 153 10*3/uL (ref 140–400)
RBC: 2.66 10*6/uL — ABNORMAL LOW (ref 4.20–5.82)
nRBC: 2 % — ABNORMAL HIGH (ref 0–0)

## 2012-10-30 LAB — COMPREHENSIVE METABOLIC PANEL (CC13)
ALT: 14 U/L (ref 0–55)
AST: 18 U/L (ref 5–34)
Alkaline Phosphatase: 81 U/L (ref 40–150)
BUN: 20.6 mg/dL (ref 7.0–26.0)
Creatinine: 1.8 mg/dL — ABNORMAL HIGH (ref 0.7–1.3)
Potassium: 3.9 mEq/L (ref 3.5–5.1)

## 2012-10-30 MED ORDER — FAMOTIDINE IN NACL 20-0.9 MG/50ML-% IV SOLN
20.0000 mg | Freq: Once | INTRAVENOUS | Status: AC
  Administered 2012-10-30: 20 mg via INTRAVENOUS

## 2012-10-30 MED ORDER — DIPHENHYDRAMINE HCL 50 MG/ML IJ SOLN
25.0000 mg | Freq: Once | INTRAMUSCULAR | Status: AC
  Administered 2012-10-30: 25 mg via INTRAVENOUS

## 2012-10-30 MED ORDER — SODIUM CHLORIDE 0.9 % IV SOLN
70.0000 mg/m2 | Freq: Once | INTRAVENOUS | Status: AC
  Administered 2012-10-30: 168 mg via INTRAVENOUS
  Filled 2012-10-30: qty 28

## 2012-10-30 MED ORDER — SODIUM CHLORIDE 0.9 % IV SOLN
Freq: Once | INTRAVENOUS | Status: AC
  Administered 2012-10-30: 14:00:00 via INTRAVENOUS

## 2012-10-30 MED ORDER — DEXAMETHASONE SODIUM PHOSPHATE 20 MG/5ML IJ SOLN
20.0000 mg | Freq: Once | INTRAMUSCULAR | Status: AC
  Administered 2012-10-30: 20 mg via INTRAVENOUS

## 2012-10-30 NOTE — Patient Instructions (Signed)
Continue chemotherapy today as scheduled.  Followup visit in 3 weeks with repeat CT scan of the chest, abdomen and pelvis. 

## 2012-10-30 NOTE — Progress Notes (Signed)
Change benadryl from 50 mg IV to 25 mg IV per Dr. Arbutus Ped, since patient is driving home and has no one to pick him up.

## 2012-10-30 NOTE — Progress Notes (Signed)
[facility] Telephone:(336) [phone]   Fax:(336) [phone]  OFFICE PROGRESS NOTE  [name], MD [phone] [place] [address]  DIAGNOSIS: Extensive stage small cell lung cancer diagnosed in August of 2013.   PRIOR THERAPY:  1) Systemic chemotherapy in the form of carboplatin for an AUC of 5 given on day 1 and etoposide 100 mg per meter squared given on days 1, 2 and 3 with Neulasta support given on day 4. Status post 2 cycle, discontinued today secondary to disease progression.  2) Systemic chemotherapy with cisplatin 30 mg/M2 and irinotecan 65 mg/M2 on days 1 and 8 every 3 weeks. Status post 5 cycles, last dose was given 07/09/2012 discontinued secondary to disease progression.   CURRENT THERAPY: Systemic chemotherapy with paclitaxel 70 mg/M2 given weekly. Status post 8 weekly cycles   INTERVAL HISTORY: [name] 72 y.o. male returns to the clinic today for followup visit. The patient is feeling fine today with no specific complaints. He is tolerating his treatment with weekly paclitaxel fairly well with no significant adverse effects. He denied having any significant chest pain, shortness breath, cough or hemoptysis. The patient denied having any nausea or vomiting and no peripheral neuropathy.  MEDICAL HISTORY: Past Medical History  Diagnosis Date  . Hypertension   . ALLERGIC RHINITIS   . Malignant neoplasm of bronchus and lung, unspecified site 01/23/2012    ALLERGIES:  has No Known Allergies.  MEDICATIONS:  Current Outpatient Prescriptions  Medication Sig Dispense Refill  . olmesartan-hydrochlorothiazide (BENICAR HCT) 40-12.5 MG per tablet Take 0.5 tablets by mouth daily.       Marland Kitchen oxyCODONE (OXY IR/ROXICODONE) 5 MG immediate release tablet Take 1 tablet (5 mg total) by mouth every 6 (six) hours as needed.  60 tablet  0  . vitamin B-12 (CYANOCOBALAMIN) 1000 MCG tablet Take 1,000 mcg by mouth daily.       No current facility-administered  medications for this visit.    SURGICAL HISTORY:  Past Surgical History  Procedure Laterality Date  . Wrist surgery  1961    right wrist  . Orif ankle fracture  05/07/2012    Procedure: OPEN REDUCTION INTERNAL FIXATION (ORIF) ANKLE FRACTURE;  Surgeon: Mable Paris, MD;  Location: [facility];  Service: Orthopedics;  Laterality: Right;    REVIEW OF SYSTEMS:  A comprehensive review of systems was negative.   PHYSICAL EXAMINATION: General appearance: alert, cooperative and no distress Head: Normocephalic, without obvious abnormality, atraumatic Neck: no adenopathy Lymph nodes: Cervical, supraclavicular, and axillary nodes normal. Resp: clear to auscultation bilaterally Cardio: regular rate and rhythm, S1, S2 normal, no murmur, click, rub or gallop GI: soft, non-tender; bowel sounds normal; no masses,  no organomegaly Extremities: extremities normal, atraumatic, no cyanosis or edema  ECOG PERFORMANCE STATUS: 1 - Symptomatic but completely ambulatory  Blood pressure 147/84, pulse 94, temperature 97.1 F (36.2 C), temperature source Oral, resp. rate 18, height 6\' 1"  (1.854 m), weight 247 lb 12.8 oz (112.401 kg).  LABORATORY DATA: Lab Results  Component Value Date   WBC 4.7 10/30/2012   HGB 9.9* 10/30/2012   HCT 29.0* 10/30/2012   MCV 109.0* 10/30/2012   PLT 153 10/30/2012      Chemistry      Component Value Date/Time   NA 141 10/23/2012 0951   NA 137 09/18/2012 1250   K 4.0 10/23/2012 0951   K 4.2 09/18/2012 1250   CL 106 10/23/2012 0951   CL 104 09/18/2012 1250  CO2 26 10/23/2012 0951   CO2 27 09/18/2012 1250   BUN 19.5 10/23/2012 0951   BUN 34* 09/18/2012 1250   CREATININE 1.6* 10/23/2012 0951   CREATININE 2.06* 09/18/2012 1250      Component Value Date/Time   CALCIUM 9.8 10/23/2012 0951   CALCIUM 10.2 09/18/2012 1250   ALKPHOS 92 10/23/2012 0951   ALKPHOS 79 09/18/2012 1250   AST 20 10/23/2012 0951   AST 14 09/18/2012 1250   ALT 19 10/23/2012 0951   ALT 12 09/18/2012 1250   BILITOT  0.43 10/23/2012 0951   BILITOT 0.4 09/18/2012 1250       RADIOGRAPHIC STUDIES: No results found.  ASSESSMENT: This is a very pleasant 72 years old [place] American male with extensive stage small cell lung cancer currently undergoing systemic chemotherapy with weekly single agent paclitaxel status post 8 cycles.   PLAN: The patient is tolerating his treatment fairly well with no significant adverse effects. I recommended for him to continue treatment with chemotherapy as scheduled. I would see him back for followup visit in 3 weeks with repeat CT scan of the chest, abdomen and pelvis for restaging of his disease.  He was advised to call immediately if he has any concerning symptoms in the interval.  All questions were answered. The patient knows to call the clinic with any problems, questions or concerns. We can certainly see the patient much sooner if necessary.

## 2012-10-30 NOTE — Patient Instructions (Addendum)
[facility] Discharge Instructions for Patients Receiving Chemotherapy  Today you received the following chemotherapy agents Taxol.  To help prevent nausea and vomiting after your treatment, we encourage you to take your nausea medication as prescribed.   If you develop nausea and vomiting that is not controlled by your nausea medication, call the clinic. If it is after clinic hours your family physician or the after hours number for the clinic or go to the Emergency Department.   BELOW ARE SYMPTOMS THAT SHOULD BE REPORTED IMMEDIATELY:  *FEVER GREATER THAN 100.5 F  *CHILLS WITH OR WITHOUT FEVER  NAUSEA AND VOMITING THAT IS NOT CONTROLLED WITH YOUR NAUSEA MEDICATION  *UNUSUAL SHORTNESS OF BREATH  *UNUSUAL BRUISING OR BLEEDING  TENDERNESS IN MOUTH AND THROAT WITH OR WITHOUT PRESENCE OF ULCERS  *URINARY PROBLEMS  *BOWEL PROBLEMS  UNUSUAL RASH Items with * indicate a potential emergency and should be followed up as soon as possible.  Feel free to call the clinic you have any questions or concerns. The clinic phone number is [phone].   I have been informed and understand all the instructions given to me. I know to contact the clinic, my physician, or go to the Emergency Department if any problems should occur. I do not have any questions at this time, but understand that I may call the clinic during office hours   should I have any questions or need assistance in obtaining follow up care.    __________________________________________  _____________  __________ Signature of Patient or Authorized Representative            Date                   Time    __________________________________________ Nurse's Signature    

## 2012-11-06 ENCOUNTER — Other Ambulatory Visit (HOSPITAL_BASED_OUTPATIENT_CLINIC_OR_DEPARTMENT_OTHER): Payer: Medicare Other | Admitting: Lab

## 2012-11-06 ENCOUNTER — Ambulatory Visit (HOSPITAL_BASED_OUTPATIENT_CLINIC_OR_DEPARTMENT_OTHER): Payer: Medicare Other

## 2012-11-06 VITALS — BP 114/72 | HR 96 | Temp 98.2°F

## 2012-11-06 DIAGNOSIS — Z5111 Encounter for antineoplastic chemotherapy: Secondary | ICD-10-CM

## 2012-11-06 DIAGNOSIS — C3491 Malignant neoplasm of unspecified part of right bronchus or lung: Secondary | ICD-10-CM

## 2012-11-06 DIAGNOSIS — C3430 Malignant neoplasm of lower lobe, unspecified bronchus or lung: Secondary | ICD-10-CM

## 2012-11-06 LAB — CBC WITH DIFFERENTIAL/PLATELET
Basophils Absolute: 0 10*3/uL (ref 0.0–0.1)
EOS%: 0.2 % (ref 0.0–7.0)
Eosinophils Absolute: 0 10*3/uL (ref 0.0–0.5)
HGB: 10.2 g/dL — ABNORMAL LOW (ref 13.0–17.1)
LYMPH%: 29.1 % (ref 14.0–49.0)
MCH: 36.8 pg — ABNORMAL HIGH (ref 27.2–33.4)
MCV: 109.4 fL — ABNORMAL HIGH (ref 79.3–98.0)
MONO%: 5.6 % (ref 0.0–14.0)
NEUT#: 2.9 10*3/uL (ref 1.5–6.5)
NEUT%: 64.6 % (ref 39.0–75.0)
Platelets: 156 10*3/uL (ref 140–400)
RDW: 15.5 % — ABNORMAL HIGH (ref 11.0–14.6)

## 2012-11-06 LAB — COMPREHENSIVE METABOLIC PANEL (CC13)
AST: 17 U/L (ref 5–34)
Albumin: 3.2 g/dL — ABNORMAL LOW (ref 3.5–5.0)
Alkaline Phosphatase: 74 U/L (ref 40–150)
BUN: 27 mg/dL — ABNORMAL HIGH (ref 7.0–26.0)
Creatinine: 1.9 mg/dL — ABNORMAL HIGH (ref 0.7–1.3)
Glucose: 139 mg/dl — ABNORMAL HIGH (ref 70–99)
Total Bilirubin: 0.35 mg/dL (ref 0.20–1.20)

## 2012-11-06 MED ORDER — SODIUM CHLORIDE 0.9 % IV SOLN
70.0000 mg/m2 | Freq: Once | INTRAVENOUS | Status: AC
  Administered 2012-11-06: 168 mg via INTRAVENOUS
  Filled 2012-11-06: qty 28

## 2012-11-06 MED ORDER — FAMOTIDINE IN NACL 20-0.9 MG/50ML-% IV SOLN
20.0000 mg | Freq: Once | INTRAVENOUS | Status: AC
  Administered 2012-11-06: 20 mg via INTRAVENOUS

## 2012-11-06 MED ORDER — DEXAMETHASONE SODIUM PHOSPHATE 20 MG/5ML IJ SOLN
20.0000 mg | Freq: Once | INTRAMUSCULAR | Status: AC
  Administered 2012-11-06: 20 mg via INTRAVENOUS

## 2012-11-06 MED ORDER — DIPHENHYDRAMINE HCL 50 MG/ML IJ SOLN
50.0000 mg | Freq: Once | INTRAMUSCULAR | Status: AC
  Administered 2012-11-06: 50 mg via INTRAVENOUS

## 2012-11-06 MED ORDER — SODIUM CHLORIDE 0.9 % IV SOLN
Freq: Once | INTRAVENOUS | Status: AC
  Administered 2012-11-06: 11:00:00 via INTRAVENOUS

## 2012-11-06 NOTE — Patient Instructions (Addendum)
[facility] Discharge Instructions for Patients Receiving Chemotherapy  Today you received the following chemotherapy agents taxol.  To help prevent nausea and vomiting after your treatment, we encourage you to take your nausea medication. Begin taking it at  today and take it as often as prescribed.   If you develop nausea and vomiting that is not controlled by your nausea medication, call the clinic.    BELOW ARE SYMPTOMS THAT SHOULD BE REPORTED IMMEDIATELY:  *FEVER GREATER THAN 100.5 F  *CHILLS WITH OR WITHOUT FEVER  NAUSEA AND VOMITING THAT IS NOT CONTROLLED WITH YOUR NAUSEA MEDICATION  *UNUSUAL SHORTNESS OF BREATH  *UNUSUAL BRUISING OR BLEEDING  TENDERNESS IN MOUTH AND THROAT WITH OR WITHOUT PRESENCE OF ULCERS  *URINARY PROBLEMS  *BOWEL PROBLEMS  UNUSUAL RASH Items with * indicate a potential emergency and should be followed up as soon as possible. Feel free to call the clinic you have any questions or concerns. The clinic phone number is [phone].

## 2012-11-13 ENCOUNTER — Other Ambulatory Visit (HOSPITAL_BASED_OUTPATIENT_CLINIC_OR_DEPARTMENT_OTHER): Payer: Medicare Other

## 2012-11-13 ENCOUNTER — Ambulatory Visit (HOSPITAL_BASED_OUTPATIENT_CLINIC_OR_DEPARTMENT_OTHER): Payer: Medicare Other

## 2012-11-13 DIAGNOSIS — C3491 Malignant neoplasm of unspecified part of right bronchus or lung: Secondary | ICD-10-CM

## 2012-11-13 DIAGNOSIS — C3430 Malignant neoplasm of lower lobe, unspecified bronchus or lung: Secondary | ICD-10-CM

## 2012-11-13 DIAGNOSIS — Z5111 Encounter for antineoplastic chemotherapy: Secondary | ICD-10-CM

## 2012-11-13 LAB — CBC WITH DIFFERENTIAL/PLATELET
Basophils Absolute: 0 10*3/uL (ref 0.0–0.1)
EOS%: 0.2 % (ref 0.0–7.0)
Eosinophils Absolute: 0 10*3/uL (ref 0.0–0.5)
HCT: 31.1 % — ABNORMAL LOW (ref 38.4–49.9)
HGB: 10.6 g/dL — ABNORMAL LOW (ref 13.0–17.1)
MCH: 37.3 pg — ABNORMAL HIGH (ref 27.2–33.4)
MCV: 109.5 fL — ABNORMAL HIGH (ref 79.3–98.0)
MONO%: 4.5 % (ref 0.0–14.0)
NEUT#: 3.4 10*3/uL (ref 1.5–6.5)
NEUT%: 70.7 % (ref 39.0–75.0)
RDW: 15.4 % — ABNORMAL HIGH (ref 11.0–14.6)

## 2012-11-13 LAB — COMPREHENSIVE METABOLIC PANEL (CC13)
AST: 17 U/L (ref 5–34)
Albumin: 3.4 g/dL — ABNORMAL LOW (ref 3.5–5.0)
Alkaline Phosphatase: 83 U/L (ref 40–150)
BUN: 25.2 mg/dL (ref 7.0–26.0)
Glucose: 190 mg/dl — ABNORMAL HIGH (ref 70–99)
Potassium: 4.2 mEq/L (ref 3.5–5.1)
Sodium: 139 mEq/L (ref 136–145)
Total Bilirubin: 0.39 mg/dL (ref 0.20–1.20)
Total Protein: 6.8 g/dL (ref 6.4–8.3)

## 2012-11-13 MED ORDER — DEXAMETHASONE SODIUM PHOSPHATE 20 MG/5ML IJ SOLN
20.0000 mg | Freq: Once | INTRAMUSCULAR | Status: AC
  Administered 2012-11-13: 20 mg via INTRAVENOUS

## 2012-11-13 MED ORDER — DIPHENHYDRAMINE HCL 50 MG/ML IJ SOLN
50.0000 mg | Freq: Once | INTRAMUSCULAR | Status: AC
  Administered 2012-11-13: 25 mg via INTRAVENOUS

## 2012-11-13 MED ORDER — SODIUM CHLORIDE 0.9 % IV SOLN
Freq: Once | INTRAVENOUS | Status: AC
  Administered 2012-11-13: 14:00:00 via INTRAVENOUS

## 2012-11-13 MED ORDER — FAMOTIDINE IN NACL 20-0.9 MG/50ML-% IV SOLN
20.0000 mg | Freq: Once | INTRAVENOUS | Status: AC
  Administered 2012-11-13: 20 mg via INTRAVENOUS

## 2012-11-13 MED ORDER — SODIUM CHLORIDE 0.9 % IV SOLN
70.0000 mg/m2 | Freq: Once | INTRAVENOUS | Status: AC
  Administered 2012-11-13: 168 mg via INTRAVENOUS
  Filled 2012-11-13: qty 28

## 2012-11-13 NOTE — Patient Instructions (Addendum)
[facility] Discharge Instructions for Patients Receiving Chemotherapy  Today you received the following chemotherapy agents Taxol.  To help prevent nausea and vomiting after your treatment, we encourage you to take your nausea medication as directed.    If you develop nausea and vomiting that is not controlled by your nausea medication, call the clinic.   BELOW ARE SYMPTOMS THAT SHOULD BE REPORTED IMMEDIATELY:  *FEVER GREATER THAN 100.5 F  *CHILLS WITH OR WITHOUT FEVER  NAUSEA AND VOMITING THAT IS NOT CONTROLLED WITH YOUR NAUSEA MEDICATION  *UNUSUAL SHORTNESS OF BREATH  *UNUSUAL BRUISING OR BLEEDING  TENDERNESS IN MOUTH AND THROAT WITH OR WITHOUT PRESENCE OF ULCERS  *URINARY PROBLEMS  *BOWEL PROBLEMS  UNUSUAL RASH Items with * indicate a potential emergency and should be followed up as soon as possible.  Feel free to call the clinic you have any questions or concerns. The clinic phone number is [phone].    

## 2012-11-19 ENCOUNTER — Ambulatory Visit (HOSPITAL_COMMUNITY)
Admission: RE | Admit: 2012-11-19 | Discharge: 2012-11-19 | Disposition: A | Discharge status: Home / Self Care | Payer: Medicare Other | Source: Ambulatory Visit | Attending: Internal Medicine | Admitting: Internal Medicine

## 2012-11-19 ENCOUNTER — Other Ambulatory Visit: Payer: Self-pay | Admitting: Internal Medicine

## 2012-11-19 DIAGNOSIS — C3490 Malignant neoplasm of unspecified part of unspecified bronchus or lung: Secondary | ICD-10-CM | POA: Insufficient documentation

## 2012-11-19 DIAGNOSIS — N281 Cyst of kidney, acquired: Secondary | ICD-10-CM | POA: Insufficient documentation

## 2012-11-19 DIAGNOSIS — C3491 Malignant neoplasm of unspecified part of right bronchus or lung: Secondary | ICD-10-CM

## 2012-11-19 DIAGNOSIS — R0602 Shortness of breath: Secondary | ICD-10-CM | POA: Insufficient documentation

## 2012-11-20 ENCOUNTER — Other Ambulatory Visit (HOSPITAL_BASED_OUTPATIENT_CLINIC_OR_DEPARTMENT_OTHER): Payer: Medicare Other | Admitting: Lab

## 2012-11-20 ENCOUNTER — Ambulatory Visit: Payer: Medicare Other

## 2012-11-20 ENCOUNTER — Other Ambulatory Visit: Payer: Self-pay | Admitting: Medical Oncology

## 2012-11-20 DIAGNOSIS — C3490 Malignant neoplasm of unspecified part of unspecified bronchus or lung: Secondary | ICD-10-CM

## 2012-11-20 LAB — CBC WITH DIFFERENTIAL/PLATELET
Basophils Absolute: 0 10*3/uL (ref 0.0–0.1)
EOS%: 0.9 % (ref 0.0–7.0)
HGB: 10.4 g/dL — ABNORMAL LOW (ref 13.0–17.1)
MCH: 36.9 pg — ABNORMAL HIGH (ref 27.2–33.4)
MONO#: 0.2 10*3/uL (ref 0.1–0.9)
NEUT#: 2.2 10*3/uL (ref 1.5–6.5)
RDW: 15 % — ABNORMAL HIGH (ref 11.0–14.6)
WBC: 3.5 10*3/uL — ABNORMAL LOW (ref 4.0–10.3)
lymph#: 1.1 10*3/uL (ref 0.9–3.3)

## 2012-11-20 LAB — COMPREHENSIVE METABOLIC PANEL (CC13)
AST: 16 U/L (ref 5–34)
Albumin: 3.1 g/dL — ABNORMAL LOW (ref 3.5–5.0)
Alkaline Phosphatase: 74 U/L (ref 40–150)
Glucose: 172 mg/dl — ABNORMAL HIGH (ref 70–99)
Potassium: 4.6 mEq/L (ref 3.5–5.1)
Sodium: 137 mEq/L (ref 136–145)
Total Protein: 6.4 g/dL (ref 6.4–8.3)

## 2012-11-20 NOTE — Progress Notes (Signed)
1025 Pt will not be treated today per Dr. Arbutus Ped. Pt will be seen by Dr Arbutus Ped on June 12,2014. Pt advised and verbalized understanding.

## 2012-11-22 ENCOUNTER — Telehealth: Payer: Self-pay | Admitting: Internal Medicine

## 2012-11-22 ENCOUNTER — Ambulatory Visit (HOSPITAL_BASED_OUTPATIENT_CLINIC_OR_DEPARTMENT_OTHER): Payer: Medicare Other | Admitting: Internal Medicine

## 2012-11-22 ENCOUNTER — Encounter: Payer: Self-pay | Admitting: Internal Medicine

## 2012-11-22 VITALS — BP 141/85 | HR 97 | Temp 97.0°F | Resp 18 | Ht 73.0 in | Wt 244.1 lb

## 2012-11-22 DIAGNOSIS — C7B8 Other secondary neuroendocrine tumors: Secondary | ICD-10-CM

## 2012-11-22 DIAGNOSIS — C7A1 Malignant poorly differentiated neuroendocrine tumors: Secondary | ICD-10-CM

## 2012-11-22 DIAGNOSIS — C3491 Malignant neoplasm of unspecified part of right bronchus or lung: Secondary | ICD-10-CM

## 2012-11-22 MED ORDER — OXYCODONE HCL 5 MG PO TABS: 5.0000 mg | ORAL_TABLET | Freq: Four times a day (QID) | ORAL | Status: DC | PRN

## 2012-11-22 NOTE — Telephone Encounter (Signed)
Gave pt appt for lab and MD on September 2014 , gave pt oral contrast pt will see Radiation next week

## 2012-11-22 NOTE — Progress Notes (Signed)
[facility] Telephone:([phone]) [phone]   [phone]  OFFICE PROGRESS NOTE  [name], MD [phone] [place] [address]  DIAGNOSIS: Extensive stage small cell lung cancer diagnosed in August of 2013.   PRIOR THERAPY:  1) Systemic chemotherapy in the form of carboplatin for an AUC of 5 given on day 1 and etoposide 100 mg per meter squared given on days 1, 2 and 3 with Neulasta support given on day 4. Status post 2 cycle, discontinued today secondary to disease progression.  2) Systemic chemotherapy with cisplatin 30 mg/M2 and irinotecan 65 mg/M2 on days 1 and 8 every 3 weeks. Status post 5 cycles, last dose was given 07/09/2012 discontinued secondary to disease progression.  3) Systemic chemotherapy with paclitaxel 70 mg/M2 given weekly. Status post 11 weekly cycles  CURRENT THERAPY: None but the patient would be first to radiation oncology for consideration of palliative radiotherapy to the progressive disease in the mediastinum.  INTERVAL HISTORY: [name] 72 y.o. male returns to the clinic today for followup visit. 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 He has no fever or chills. He denied having any significant chest pain, shortness breath, cough or hemoptysis. He had repeat CT scan of the chest, abdomen and pelvis performed recently and he is here for evaluation and discussion of his scan results.  MEDICAL HISTORY: Past Medical History  Diagnosis Date  . Hypertension   . ALLERGIC RHINITIS   . Malignant neoplasm of bronchus and lung, unspecified site 01/23/2012    ALLERGIES:  has No Known Allergies.  MEDICATIONS:  Current Outpatient Prescriptions  Medication Sig Dispense Refill  . olmesartan-hydrochlorothiazide (BENICAR HCT) 40-12.5 MG per tablet Take 0.5  tablets by mouth daily.       Marland Kitchen oxyCODONE (OXY IR/ROXICODONE) 5 MG immediate release tablet Take 1 tablet (5 mg total) by mouth every 6 (six) hours as needed.  60 tablet  0  . vitamin B-12 (CYANOCOBALAMIN) 1000 MCG tablet Take 1,000 mcg by mouth daily.       No current facility-administered medications for this visit.    SURGICAL HISTORY:  Past Surgical History  Procedure Laterality Date  . Wrist surgery  1961    right wrist  . Orif ankle fracture  05/07/2012    Procedure: OPEN REDUCTION INTERNAL FIXATION (ORIF) ANKLE FRACTURE;  Surgeon: Mable Paris, MD;  Location: [facility];  Service: Orthopedics;  Laterality: Right;    REVIEW OF SYSTEMS:  A comprehensive review of systems was negative except for: Constitutional: positive for fatigue Neurological: positive for paresthesia   PHYSICAL EXAMINATION: General appearance: alert, cooperative and fatigued Head: Normocephalic, without obvious abnormality, atraumatic Neck: no adenopathy Lymph nodes: Cervical, supraclavicular, and axillary nodes normal. Resp: clear to auscultation bilaterally Cardio: regular rate and rhythm, S1, S2 normal, no murmur, click, rub or gallop GI: soft, non-tender; bowel sounds normal; no masses,  no organomegaly Extremities: extremities normal, atraumatic, no cyanosis or edema Neurologic: Alert and oriented X 3, normal strength and tone. Normal symmetric reflexes. Normal coordination and gait  ECOG PERFORMANCE STATUS: 1 - Symptomatic but completely ambulatory  Blood pressure 141/85, pulse 97, temperature 97 F (36.1 C), temperature source Oral, resp. rate 18, height 6\' 1"  (1.854 m), weight 244 lb 1.6 oz (110.723 kg).  LABORATORY DATA: Lab Results  Component Value Date  WBC 3.5* 11/20/2012   HGB 10.4* 11/20/2012   HCT 30.6* 11/20/2012   MCV 108.5* 11/20/2012   PLT 158 11/20/2012      Chemistry      Component Value Date/Time   NA 137 11/20/2012 0939   NA 137 09/18/2012 1250   K 4.6 11/20/2012 0939    K 4.2 09/18/2012 1250   CL 105 11/20/2012 0939   CL 104 09/18/2012 1250   CO2 25 11/20/2012 0939   CO2 27 09/18/2012 1250   BUN 20.9 11/20/2012 0939   BUN 34* 09/18/2012 1250   CREATININE 1.7* 11/20/2012 0939   CREATININE 2.06* 09/18/2012 1250      Component Value Date/Time   CALCIUM 9.6 11/20/2012 0939   CALCIUM 10.2 09/18/2012 1250   ALKPHOS 74 11/20/2012 0939   ALKPHOS 79 09/18/2012 1250   AST 16 11/20/2012 0939   AST 14 09/18/2012 1250   ALT 12 11/20/2012 0939   ALT 12 09/18/2012 1250   BILITOT 0.33 11/20/2012 0939   BILITOT 0.4 09/18/2012 1250       RADIOGRAPHIC STUDIES: Ct Chest Wo Contrast  11/19/2012   *RADIOLOGY REPORT*  Clinical Data:  Lung cancer.  Chemotherapy ongoing.  CT CHEST, ABDOMEN AND PELVIS WITHOUT CONTRAST  Technique:  Multidetector CT imaging of the chest, abdomen and pelvis was performed following the standard protocol without IV contrast.  Comparison:   None.   CT CHEST  Findings:  Symmetric gynecomastia again noted.  No axillary or supraclavicular lymphadenopathy.  No enlarged paratracheal lymph nodes.  Right lower paratracheal lymph nodes measures 22 mm increased from 17 mm on prior.  High right paratracheal lymph node measures 11 mm (image #7) increased from 5 mm on prior.  Hilar adenopathy is difficult to measure without IV contrast.  There is a subcarinal lymph node extending along the bronchus intermedius measuring 22 mm short axis (image 28) which is increased from 19 mm on prior.   Multiple nodules are again demonstrated at the right lung base. There is central peribronchovascular thickening extending from hilum. The dominant nodule measures 21 x 19 mm (image 37) compared to 21 x 26 mm on prior remeasured.  Nodule in the azygo-esophageal recess measures 11 mm  similar to 12 mm on prior.  Multiple nodule over the right hemidiaphragm.   Nodular pleural thickening in the right middle lobe is slightly decreased measures 11 mm compared to 12 mm on prior.  The left lower lobe 9 mm nodule over  the diaphragm (image 47) is not changed 9 mm on prior.  IMPRESSION:  1.  Nodularity and bronchovascular perihilar thickening within the right lower lobe is similar to prior and consistent with metastatic disease. 2.  Left lobe pulmonary nodule is stable. 3.  Interval increase in mediastinal lymph node  volume consistent with metastatic disease progression.   CT ABDOMEN AND PELVIS  Findings:  No focal hepatic lesion on this noncontrast exam.  The gallbladder, pancreas, spleen, adrenal glands are unchanged.  There is thickening of the left and right adrenal gland which is stable. There are bilateral low-density renal cysts which are not changed from prior and likely benign.  The stomach, small bowel, appendix, cecum are normal.  The colon and rectosigmoid colon are normal.  Abdominal aorta normal caliber.  No retroperitoneal periportal lymphadenopathy.  Prostate gland bladder normal.  No pelvic lymphadenopathy. Review of  bone windows demonstrates no aggressive osseous lesions.  IMPRESSION:  1.  No evidence of metastasis within the abdomen or pelvis. 2.  Stable thickening of the adrenal glands likely represents hyperplasia. 4.  Stable renal cysts.   Original Report Authenticated By: Genevive Bi, M.D.    ASSESSMENT AND PLAN:  1) extensive stage small cell lung cancer status post several chemotherapy regimen and recently treated with single agent paclitaxel but unfortunately has evidence for disease progression. I would discontinue his current chemotherapy for now. I personally reviewed the images of the scan and discuss the results with the patient. I recommended for him to see radiation oncology for consideration of palliative radiotherapy to the progressive disease in the mediastinum. I would see the patient back for followup visit in 3 months with repeat CT scan of the chest, abdomen and pelvis for restaging of his disease. 2) for pain management, the patient was given a refill of OxyIR. He was advised  to call immediately she has any concerning symptoms in the interval.  All questions were answered. The patient knows to call the clinic with any problems, questions or concerns. We can certainly see the patient much sooner if necessary.  I spent 15 minutes counseling the patient face to face. The total time spent in the appointment was 25 minutes.

## 2012-11-22 NOTE — Patient Instructions (Signed)
Your recent scan showed evidence for disease progression.  I will refer you to radiation oncology for consideration of palliative radiotherapy to the progressive disease in the mediastinum. Followup visit in 3 months with repeat CT scan of the chest, abdomen and pelvis.

## 2012-11-27 ENCOUNTER — Ambulatory Visit: Payer: Medicare Other

## 2012-11-27 ENCOUNTER — Other Ambulatory Visit: Payer: Medicare Other | Admitting: Lab

## 2012-11-27 NOTE — Progress Notes (Signed)
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   Patient presented  months ago with symptoms of: None.   Found will being evaluated for hematuria when CT Scan Obtained of Abdomen and pelvis-  Biopsies of right Lower Lobe Mass (if applicable) revealed: High Grade , Poorly differentiated  Neuroendocrine Carcinoma, Small Cell Type with incomplete visualization of the central right lower lobe mass versus infrahilar lymphadenopathy with probable postobstructive pneumonitis in the peripheral right lower lobe in addition to an 8 mm noncalcified pulmonary nodule in the left lower lobe suspicious for malignancy.  Tobacco/Marijuana/Snuff/ETOH use: Smoked 0.5 ppd for 50 years.  No alcohol nor drugs  Past/Anticipated interventions by cardiothoracic surgery, if any: 01/18/12 Flexible video fiberoptic bronchoscopy with endobronchial ultrasound and biopsies  Past/Anticipated interventions by medical oncology, if any: see below  1) Systemic chemotherapy in the form of carboplatin for an AUC of 5 given on day 1 and etoposide 100 mg per meter squared given on days 1, 2 and 3 with Neulasta support given on day 4. Status post 2 cycle, discontinued today secondary to disease progression.  2) Systemic chemotherapy with cisplatin 30 mg/M2 and irinotecan 65 mg/M2 on days 1 and 8 every 3 weeks. Status post 5 cycles, last dose was given 07/09/2012 discontinued secondary to disease progression.  3) Systemic chemotherapy with paclitaxel 70 mg/M2 given weekly. Status post 11 weekly cycles    Now has progressive disease in the mediastinum   Signs/Symptoms  Weight changes, if any: maintaining weight as evidenced by Docflowsheet  Respiratory complaints, if any: C/o "a little' SOB when active  Hemoptysis, if any: No  Pain issues, if any:  pain - adjacent to his spine in lower mid back area.  SAFETY ISSUES:  Prior radiation?  No  Pacemaker/ICD? No  Possible current pregnancy?No  Is the patient on methotrexate? No  Current Complaints / other details:  "mild" peripheral neuropathy in fingers.

## 2012-11-28 ENCOUNTER — Ambulatory Visit
Admission: RE | Admit: 2012-11-28 | Discharge: 2012-11-28 | Disposition: A | Discharge status: Home / Self Care | Payer: Medicare Other | Source: Ambulatory Visit | Attending: Radiation Oncology | Admitting: Radiation Oncology

## 2012-11-28 ENCOUNTER — Encounter: Payer: Self-pay | Admitting: Radiation Oncology

## 2012-11-28 DIAGNOSIS — N281 Cyst of kidney, acquired: Secondary | ICD-10-CM | POA: Insufficient documentation

## 2012-11-28 DIAGNOSIS — C3490 Malignant neoplasm of unspecified part of unspecified bronchus or lung: Secondary | ICD-10-CM | POA: Insufficient documentation

## 2012-11-28 DIAGNOSIS — Z510 Encounter for antineoplastic radiation therapy: Secondary | ICD-10-CM | POA: Insufficient documentation

## 2012-11-28 DIAGNOSIS — I10 Essential (primary) hypertension: Secondary | ICD-10-CM | POA: Insufficient documentation

## 2012-11-28 DIAGNOSIS — C3430 Malignant neoplasm of lower lobe, unspecified bronchus or lung: Secondary | ICD-10-CM | POA: Insufficient documentation

## 2012-11-28 DIAGNOSIS — Z9221 Personal history of antineoplastic chemotherapy: Secondary | ICD-10-CM | POA: Insufficient documentation

## 2012-11-28 DIAGNOSIS — F17200 Nicotine dependence, unspecified, uncomplicated: Secondary | ICD-10-CM | POA: Insufficient documentation

## 2012-11-28 NOTE — Progress Notes (Signed)
  Radiation Oncology         [phone] ________________________________  Name: [name] MRN: [id number]  Date: 11/28/2012  DOB: [birth year]  SIMULATION AND TREATMENT PLANNING NOTE  Outpatient  DIAGNOSIS:  Extensive stage Small Cell lung cancer  NARRATIVE:  The patient was brought to the CT Simulation planning suite.  Identity was confirmed.  All relevant records and images related to the planned course of therapy were reviewed.  The patient freely provided informed written consent to proceed with treatment after reviewing the details related to the planned course of therapy. The consent form was witnessed and verified by the simulation staff.    Then, the patient was set-up in a stable reproducible  supine position for radiation therapy.  CT images were obtained.  Surface markings were placed.  The CT images were loaded into the planning software.    TREATMENT PLANNING NOTE: Treatment planning then occurred.  The radiation prescription was entered and confirmed.    A total of 3 medically necessary complex treatment devices were fabricated and supervised by me; 3 fields with MLCs for custom blocks to limit exposure of the following normal tissues:  I have requested : 3D Simulation  I have requested a DVH of the following structures: lungs, heart, cord, esophagus, CTV, PTV.    The patient will receive 30 Gy in 10 fractions to cover the majority of his central nodal disease and Right lower lobe disease.   -----------------------------------  Lonie Peak, MD

## 2012-11-28 NOTE — Progress Notes (Signed)
Radiation Oncology         [phone] ________________________________  Initial outpatient Consultation  Name: [name] MRN: [id number]  Date: 11/28/2012  DOB: Oct 15, 1940  MV:HQIONG,EXBMWUX C, MD  Si Gaul, MD   REFERRING PHYSICIAN: Si Gaul, MD  DIAGNOSIS: The encounter diagnosis was Lung cancer, lower lobe, right. Small Cell Lung Cancer,  Stage IV  HISTORY OF PRESENT ILLNESS::[name] is a 72 y.o. male who was diagnosed with extensive stage Small Cell Lung Cancer in aug 2013.  MRI brain was negative at diagnosis, and abdomen/pelvis, bones appearing clear on PET/CT imaging.  But, in his chest, he had hypermetabolic right lower lobe mass, with adjacent peripheral  hypermetabolic activity which may represent lymphangitic spread of  tumor. There was also hypermetabolic foci along the right pleural surface/hemidiaphragm, most compatible with pleural metastatic  disease.Hypermetabolic right  paratracheal and subcarinal adenopathy was noted.  He's undergone several regimens of chemotherapy, with progression, as outlined by Dr Arbutus Ped:  1) Systemic chemotherapy in the form of carboplatin for an AUC of 5 given on day 1 and etoposide 100 mg per meter squared given on days 1, 2 and 3 with Neulasta support given on day 4. Status post 2 cycle, discontinued today secondary to disease progression.  2) Systemic chemotherapy with cisplatin 30 mg/M2 and irinotecan 65 mg/M2 on days 1 and 8 every 3 weeks. Status post 5 cycles, last dose was given 07/09/2012 discontinued secondary to disease progression.  3) Systemic chemotherapy with paclitaxel 70 mg/M2 given weekly. Status post 11 weekly cycles   Most recent CT on 11/19/12 of CAP w/o contrast showed multiple nodules over the right diaphragm, with pleural thickening, and 1)Nodularity and bronchovascular perihilar thickening within the  right lower lobe is similar to prior and consistent with metastatic  disease.  2. Left lobe  pulmonary nodule is stable.  3. Interval increase in mediastinal lymph node volume consistent  with metastatic disease progression  Abd/Pelvis are negative.  He has no weight loss recently, no hemoptysis.  A little SOB with activity.  Pain in the lower right posterior chest. Some mild finger neuropathy.   PREVIOUS RADIATION THERAPY: No  PAST MEDICAL HISTORY:  has a past medical history of Hypertension; ALLERGIC RHINITIS; and Malignant neoplasm of bronchus and lung, unspecified site (01/23/2012).    PAST SURGICAL HISTORY: Past Surgical History  Procedure Laterality Date  . Wrist surgery  1961    right wrist  . Orif ankle fracture  05/07/2012    Procedure: OPEN REDUCTION INTERNAL FIXATION (ORIF) ANKLE FRACTURE;  Surgeon: Mable Paris, MD;  Location: [facility];  Service: Orthopedics;  Laterality: Right;    FAMILY HISTORY: family history includes Coronary artery disease in his mother and Hypertension in his brother, mother, and sister.  SOCIAL HISTORY:  reports that he has been smoking Cigarettes.  He has a 25 pack-year smoking history. He has never used smokeless tobacco. He reports that he does not drink alcohol or use illicit drugs.  ALLERGIES: Review of patient's allergies indicates no known allergies.  MEDICATIONS:  Current Outpatient Prescriptions  Medication Sig Dispense Refill  . olmesartan-hydrochlorothiazide (BENICAR HCT) 40-12.5 MG per tablet Take 0.5 tablets by mouth daily.       Marland Kitchen oxyCODONE (OXY IR/ROXICODONE) 5 MG immediate release tablet Take 1 tablet (5 mg total) by mouth every 6 (six) hours as needed.  60 tablet  0  . vitamin B-12 (CYANOCOBALAMIN) 1000 MCG tablet Take 1,000 mcg by mouth daily.       No  current facility-administered medications for this encounter.    REVIEW OF SYSTEMS:  Pertinent items are noted in HPI.   PHYSICAL EXAM:  height is 6\' 1"  (1.854 m) and weight is 246 lb 9.6 oz (111.857 kg). His temperature is 97.9 F (36.6 C). His blood  pressure is 117/76 and his pulse is 87.   General: Alert and oriented, in no acute distress HEENT: Head is normocephalic. Pupils are equally round and reactive to light. Extraocular movements are intact. Oropharynx is clear. Neck: Neck is supple, no palpable cervical or supraclavicular lymphadenopathy. Heart: Regular in rate and rhythm with no murmurs, rubs, or gallops. Chest: Clear to auscultation bilaterally, with no rhonchi, wheezes, or rales. Abdomen: Soft, nontender, nondistended, with no rigidity or guarding. Extremities: No cyanosis or edema. Lymphatics: No concerning lymphadenopathy. Skin: No concerning lesions. Musculoskeletal: symmetric strength and muscle tone throughout. Neurologic: Cranial nerves II through XII are grossly intact. No obvious focalities. Speech is fluent. Coordination is intact. Psychiatric: Judgment and insight are intact. Affect is appropriate.    LABORATORY DATA:  Lab Results  Component Value Date   WBC 3.5* 11/20/2012   HGB 10.4* 11/20/2012   HCT 30.6* 11/20/2012   MCV 108.5* 11/20/2012   PLT 158 11/20/2012   CMP     Component Value Date/Time   NA 137 11/20/2012 0939   NA 137 09/18/2012 1250   K 4.6 11/20/2012 0939   K 4.2 09/18/2012 1250   CL 105 11/20/2012 0939   CL 104 09/18/2012 1250   CO2 25 11/20/2012 0939   CO2 27 09/18/2012 1250   GLUCOSE 172* 11/20/2012 0939   GLUCOSE 157* 09/18/2012 1250   BUN 20.9 11/20/2012 0939   BUN 34* 09/18/2012 1250   CREATININE 1.7* 11/20/2012 0939   CREATININE 2.06* 09/18/2012 1250   CALCIUM 9.6 11/20/2012 0939   CALCIUM 10.2 09/18/2012 1250   PROT 6.4 11/20/2012 0939   PROT 7.2 09/18/2012 1250   ALBUMIN 3.1* 11/20/2012 0939   ALBUMIN 3.8 09/18/2012 1250   AST 16 11/20/2012 0939   AST 14 09/18/2012 1250   ALT 12 11/20/2012 0939   ALT 12 09/18/2012 1250   ALKPHOS 74 11/20/2012 0939   ALKPHOS 79 09/18/2012 1250   BILITOT 0.33 11/20/2012 0939   BILITOT 0.4 09/18/2012 1250   GFRNONAA 55* 06/23/2012 0228   GFRAA 63* 06/23/2012 0228           RADIOGRAPHY: Ct Abdomen Pelvis Wo Contrast  11/19/2012   *RADIOLOGY REPORT*  Clinical Data:  Lung cancer.  Chemotherapy ongoing.  CT CHEST, ABDOMEN AND PELVIS WITHOUT CONTRAST  Technique:  Multidetector CT imaging of the chest, abdomen and pelvis was performed following the standard protocol without IV contrast.  Comparison:   None.  CT CHEST  Findings:  Symmetric gynecomastia again noted.  No axillary or supraclavicular lymphadenopathy.  No enlarged paratracheal lymph nodes.  Right lower paratracheal lymph nodes measures 22 mm increased from 17 mm on prior.  High right paratracheal lymph node measures 11 mm (image #7) increased from 5 mm on prior.  Hilar adenopathy is difficult to measure without IV contrast.  There is a subcarinal lymph node extending along the bronchus intermedius measuring 22 mm short axis (image 28) which is increased from 19 mm on prior.   Multiple nodules are again demonstrated at the right lung base. There is central peribronchovascular thickening extending from hilum. The dominant nodule measures 21 x 19 mm (image 37) compared to 21 x 26 mm on prior remeasured.  Nodule in  the azygo-esophageal recess measures 11 mm  similar to 12 mm on prior.  Multiple nodule over the right hemidiaphragm.   Nodular pleural thickening in the right middle lobe is slightly decreased measures 11 mm compared to 12 mm on prior.  The left lower lobe 9 mm nodule over the diaphragm (image 47) is not changed 9 mm on prior.  IMPRESSION:  1.  Nodularity and bronchovascular perihilar thickening within the right lower lobe is similar to prior and consistent with metastatic disease. 2.  Left lobe pulmonary nodule is stable. 3.  Interval increase in mediastinal lymph node  volume consistent with metastatic disease progression.  CT ABDOMEN AND PELVIS  Findings:  No focal hepatic lesion on this noncontrast exam.  The gallbladder, pancreas, spleen, adrenal glands are unchanged.  There is thickening of the left and right  adrenal gland which is stable. There are bilateral low-density renal cysts which are not changed from prior and likely benign.  The stomach, small bowel, appendix, cecum are normal.  The colon and rectosigmoid colon are normal.  Abdominal aorta normal caliber.  No retroperitoneal periportal lymphadenopathy.  Prostate gland bladder normal.  No pelvic lymphadenopathy. Review of  bone windows demonstrates no aggressive osseous lesions.  IMPRESSION:  1.  No evidence of metastasis within the abdomen or pelvis. 2.  Stable thickening of the adrenal glands likely represents hyperplasia. 4.  Stable renal cysts.   Original Report Authenticated By: Genevive Bi, M.D.   Ct Chest Wo Contrast  11/19/2012   *RADIOLOGY REPORT*  Clinical Data:  Lung cancer.  Chemotherapy ongoing.  CT CHEST, ABDOMEN AND PELVIS WITHOUT CONTRAST  Technique:  Multidetector CT imaging of the chest, abdomen and pelvis was performed following the standard protocol without IV contrast.  Comparison:   None.  CT CHEST  Findings:  Symmetric gynecomastia again noted.  No axillary or supraclavicular lymphadenopathy.  No enlarged paratracheal lymph nodes.  Right lower paratracheal lymph nodes measures 22 mm increased from 17 mm on prior.  High right paratracheal lymph node measures 11 mm (image #7) increased from 5 mm on prior.  Hilar adenopathy is difficult to measure without IV contrast.  There is a subcarinal lymph node extending along the bronchus intermedius measuring 22 mm short axis (image 28) which is increased from 19 mm on prior.   Multiple nodules are again demonstrated at the right lung base. There is central peribronchovascular thickening extending from hilum. The dominant nodule measures 21 x 19 mm (image 37) compared to 21 x 26 mm on prior remeasured.  Nodule in the azygo-esophageal recess measures 11 mm  similar to 12 mm on prior.  Multiple nodule over the right hemidiaphragm.   Nodular pleural thickening in the right middle lobe is slightly  decreased measures 11 mm compared to 12 mm on prior.  The left lower lobe 9 mm nodule over the diaphragm (image 47) is not changed 9 mm on prior.  IMPRESSION:  1.  Nodularity and bronchovascular perihilar thickening within the right lower lobe is similar to prior and consistent with metastatic disease. 2.  Left lobe pulmonary nodule is stable. 3.  Interval increase in mediastinal lymph node  volume consistent with metastatic disease progression.  CT ABDOMEN AND PELVIS  Findings:  No focal hepatic lesion on this noncontrast exam.  The gallbladder, pancreas, spleen, adrenal glands are unchanged.  There is thickening of the left and right adrenal gland which is stable. There are bilateral low-density renal cysts which are not changed from prior and likely  benign.  The stomach, small bowel, appendix, cecum are normal.  The colon and rectosigmoid colon are normal.  Abdominal aorta normal caliber.  No retroperitoneal periportal lymphadenopathy.  Prostate gland bladder normal.  No pelvic lymphadenopathy. Review of  bone windows demonstrates no aggressive osseous lesions.  IMPRESSION:  1.  No evidence of metastasis within the abdomen or pelvis. 2.  Stable thickening of the adrenal glands likely represents hyperplasia. 4.  Stable renal cysts.   Original Report Authenticated By: Genevive Bi, M.D.      IMPRESSION/PLAN:   96 72 yo man with Stage IV small cell lung cancer, refractory to chemotherapy, with mediastinal progression.  I think that two weeks of palliative RT is appropriate for local control.  Main side effects would be esophagitis, fatigue, skin irritation.  There is a smaller risk of organ injury, such as pneumonitis.  Consent form signed today - patient enthusiastic to proceed. Will simulate today, start RT next week.  Of note, I did not recommend PCI, as he's had not had a favorable chemotherapy response and is elderly (I'm concerned that he'd tolerate PCI suboptimally).  I spent 50 minutes face to  face with the patient and more than 50% of that time was spent in counseling and/or coordination of care.    __________________________________________   Lonie Peak, MD

## 2012-11-30 ENCOUNTER — Encounter: Payer: Self-pay | Admitting: Radiation Oncology

## 2012-12-04 ENCOUNTER — Ambulatory Visit: Payer: Medicare Other

## 2012-12-04 ENCOUNTER — Other Ambulatory Visit: Payer: Medicare Other | Admitting: Lab

## 2012-12-05 ENCOUNTER — Ambulatory Visit
Admission: RE | Admit: 2012-12-05 | Discharge: 2012-12-05 | Disposition: A | Discharge status: Home / Self Care | Payer: Medicare Other | Source: Ambulatory Visit | Attending: Radiation Oncology | Admitting: Radiation Oncology

## 2012-12-05 DIAGNOSIS — C3490 Malignant neoplasm of unspecified part of unspecified bronchus or lung: Secondary | ICD-10-CM

## 2012-12-05 NOTE — Progress Notes (Signed)
  Radiation Oncology         [phone] ________________________________  Name: [name] MRN: [id number]  Date: 12/05/2012  DOB: 1940-09-14  Simulation Verification Note  Status: outpatient  NARRATIVE: The patient was brought to the treatment unit and placed in the planned treatment position. The clinical setup was verified. Then port films were obtained and uploaded to the radiation oncology medical record software.  The treatment beams were carefully compared against the planned radiation fields. The position location and shape of the radiation fields was reviewed. They targeted volume of tissue appears to be appropriately covered by the radiation beams. Organs at risk appear to be excluded as planned.  Based on my personal review, I approved the simulation verification. The patient's treatment will proceed as planned.  ------------------------------------------------  Artist Pais Kathrynn Running, M.D.

## 2012-12-06 ENCOUNTER — Ambulatory Visit
Admission: RE | Admit: 2012-12-06 | Discharge: 2012-12-06 | Disposition: A | Discharge status: Home / Self Care | Payer: Medicare Other | Source: Ambulatory Visit | Attending: Radiation Oncology | Admitting: Radiation Oncology

## 2012-12-06 ENCOUNTER — Ambulatory Visit: Payer: Medicare Other

## 2012-12-07 ENCOUNTER — Ambulatory Visit
Admission: RE | Admit: 2012-12-07 | Discharge: 2012-12-07 | Disposition: A | Discharge status: Home / Self Care | Payer: Medicare Other | Source: Ambulatory Visit | Attending: Radiation Oncology | Admitting: Radiation Oncology

## 2012-12-07 ENCOUNTER — Ambulatory Visit: Payer: Medicare Other

## 2012-12-10 ENCOUNTER — Encounter: Payer: Self-pay | Admitting: Radiation Oncology

## 2012-12-10 ENCOUNTER — Ambulatory Visit: Payer: Medicare Other

## 2012-12-10 ENCOUNTER — Ambulatory Visit
Admission: RE | Admit: 2012-12-10 | Discharge: 2012-12-10 | Disposition: A | Discharge status: Home / Self Care | Payer: Medicare Other | Source: Ambulatory Visit | Attending: Radiation Oncology | Admitting: Radiation Oncology

## 2012-12-10 VITALS — BP 103/72 | HR 93 | Temp 98.3°F | Ht 73.0 in | Wt 244.3 lb

## 2012-12-10 DIAGNOSIS — R918 Other nonspecific abnormal finding of lung field: Secondary | ICD-10-CM

## 2012-12-10 DIAGNOSIS — C3491 Malignant neoplasm of unspecified part of right bronchus or lung: Secondary | ICD-10-CM

## 2012-12-10 MED ORDER — BIAFINE EX EMUL
CUTANEOUS | Status: DC | PRN
  Administered 2012-12-10: 10:00:00 via TOPICAL

## 2012-12-10 NOTE — Progress Notes (Signed)
[name] has received 3 fractions to his mediastnal region   He c/o of level 3 pain in his right ribs - aching pain.  He denies any irritation of his esophagus.   Reviewed skin care in the treatment field.  Reports good appetite and he admits to some fatigue and naps during the day.

## 2012-12-10 NOTE — Progress Notes (Signed)
   Weekly Management Note:  outpatient Current Dose:  9 Gy  Projected Dose: 30 Gy   Narrative:  The patient presents for routine under treatment assessment.  CBCT/MVCT images/Port film x-rays were reviewed.  The chart was checked. He is doing well, no new complaints. No esophagitis  Physical Findings:  height is 6\' 1"  (1.854 m) and weight is 244 lb 4.8 oz (110.814 kg). His temperature is 98.3 F (36.8 C). His blood pressure is 103/72 and his pulse is 93.   Impression:  The patient is tolerating radiotherapy.  Plan:  Continue radiotherapy as planned.  ________________________________   Lonie Peak, M.D.

## 2012-12-11 ENCOUNTER — Ambulatory Visit
Admission: RE | Admit: 2012-12-11 | Discharge: 2012-12-11 | Disposition: A | Discharge status: Home / Self Care | Payer: Medicare Other | Source: Ambulatory Visit | Attending: Radiation Oncology | Admitting: Radiation Oncology

## 2012-12-11 ENCOUNTER — Ambulatory Visit: Payer: Medicare Other

## 2012-12-12 ENCOUNTER — Ambulatory Visit
Admission: RE | Admit: 2012-12-12 | Discharge: 2012-12-12 | Disposition: A | Discharge status: Home / Self Care | Payer: Medicare Other | Source: Ambulatory Visit | Attending: Radiation Oncology | Admitting: Radiation Oncology

## 2012-12-12 ENCOUNTER — Ambulatory Visit: Payer: Medicare Other

## 2012-12-13 ENCOUNTER — Ambulatory Visit: Payer: Medicare Other

## 2012-12-13 ENCOUNTER — Ambulatory Visit
Admission: RE | Admit: 2012-12-13 | Discharge: 2012-12-13 | Disposition: A | Discharge status: Home / Self Care | Payer: Medicare Other | Source: Ambulatory Visit | Attending: Radiation Oncology | Admitting: Radiation Oncology

## 2012-12-17 ENCOUNTER — Ambulatory Visit
Admission: RE | Admit: 2012-12-17 | Discharge: 2012-12-17 | Disposition: A | Discharge status: Home / Self Care | Payer: Medicare Other | Source: Ambulatory Visit | Attending: Radiation Oncology | Admitting: Radiation Oncology

## 2012-12-17 ENCOUNTER — Ambulatory Visit: Payer: Medicare Other

## 2012-12-17 MED ORDER — MAGIC MOUTHWASH W/LIDOCAINE: ORAL | Status: DC

## 2012-12-17 MED ORDER — SUCRALFATE 1 G PO TABS: ORAL_TABLET | ORAL | Status: AC

## 2012-12-17 NOTE — Progress Notes (Signed)
   Weekly Management Note: Outpatient Current Dose:  21 Gy  Projected Dose: 30 Gy   Narrative:  The patient presents for routine under treatment assessment.  CBCT/MVCT images/Port film x-rays were reviewed.  The chart was checked. Tired, but not much more than baseline. No esophagitis so far. No skin irritation  Physical Findings:  height is 6\' 1"  (1.854 m) and weight is 240 lb 9.6 oz (109.135 kg). His temperature is 98.6 F (37 C). His blood pressure is 108/79 and his pulse is 94. His oxygen saturation is 100%.  NAD  Impression:  The patient is tolerating radiotherapy.  Plan:  Continue radiotherapy as planned. Rx for carafate/ MMW given in case needed in the next week for esophagitis. Radiaplex prn skin irritation if it develops. 1 mo f/u card given.  ________________________________   Lonie Peak, M.D.

## 2012-12-17 NOTE — Progress Notes (Signed)
[name] has received 7 fractions to his Mediastinum and right lung.  He denies any pain nor discomfort in the chest or mediastinum, but c/o of  Pain in his right lower/mid lateral back region which he grades as a level 4 on a scale of 0-10.  He states this is a new pain, but he obtains relief with his Oxycodone.

## 2012-12-18 ENCOUNTER — Ambulatory Visit: Payer: Medicare Other

## 2012-12-18 ENCOUNTER — Ambulatory Visit
Admission: RE | Admit: 2012-12-18 | Discharge: 2012-12-18 | Disposition: A | Discharge status: Home / Self Care | Payer: Medicare Other | Source: Ambulatory Visit | Attending: Radiation Oncology | Admitting: Radiation Oncology

## 2012-12-19 ENCOUNTER — Ambulatory Visit: Payer: Medicare Other

## 2012-12-19 ENCOUNTER — Ambulatory Visit
Admission: RE | Admit: 2012-12-19 | Discharge: 2012-12-19 | Disposition: A | Discharge status: Home / Self Care | Payer: Medicare Other | Source: Ambulatory Visit | Attending: Radiation Oncology | Admitting: Radiation Oncology

## 2012-12-20 ENCOUNTER — Ambulatory Visit
Admission: RE | Admit: 2012-12-20 | Discharge: 2012-12-20 | Disposition: A | Discharge status: Home / Self Care | Payer: Medicare Other | Source: Ambulatory Visit | Attending: Radiation Oncology | Admitting: Radiation Oncology

## 2012-12-20 ENCOUNTER — Encounter: Payer: Self-pay | Admitting: Radiation Oncology

## 2012-12-20 ENCOUNTER — Ambulatory Visit: Payer: Medicare Other

## 2012-12-21 ENCOUNTER — Ambulatory Visit: Payer: Medicare Other

## 2012-12-23 NOTE — Progress Notes (Signed)
  Radiation Oncology         [phone] ________________________________  Name: [name] MRN: [id number]  Date: 12/20/2012  DOB: 10/23/1940  End of Treatment Note  Diagnosis: The encounter diagnosis was Lung cancer, lower lobe, right.  Small Cell Lung Cancer, Stage IV  Indication for treatment:  Palliative       Radiation treatment dates:   12/06/2012-12/20/2012  Site/dose:   Mediastinum, Right Lung / 30 Gy /10 fractions  Beams/energy:   3D conformal / 10, 15 and 6 MV photons  Narrative: The patient tolerated radiation treatment relatively well.   He denied any esophagitis.  Plan: The patient has completed radiation treatment. The patient will return to radiation oncology clinic for routine followup in one month. I advised them to call or return sooner if they have any questions or concerns related to their recovery or treatment.  -----------------------------------  Lonie Peak, MD

## 2012-12-24 ENCOUNTER — Ambulatory Visit: Payer: Medicare Other

## 2012-12-25 ENCOUNTER — Ambulatory Visit: Payer: Medicare Other

## 2012-12-26 ENCOUNTER — Ambulatory Visit: Payer: Medicare Other

## 2012-12-27 ENCOUNTER — Ambulatory Visit: Payer: Medicare Other

## 2012-12-28 ENCOUNTER — Ambulatory Visit: Payer: Medicare Other

## 2012-12-31 ENCOUNTER — Ambulatory Visit: Payer: Medicare Other

## 2013-01-01 ENCOUNTER — Ambulatory Visit: Payer: Medicare Other

## 2013-01-02 ENCOUNTER — Ambulatory Visit: Payer: Medicare Other

## 2013-01-03 ENCOUNTER — Ambulatory Visit: Payer: Medicare Other

## 2013-01-04 ENCOUNTER — Ambulatory Visit: Payer: Medicare Other

## 2013-01-07 ENCOUNTER — Ambulatory Visit: Payer: Medicare Other

## 2013-01-08 ENCOUNTER — Ambulatory Visit: Payer: Medicare Other

## 2013-01-09 ENCOUNTER — Ambulatory Visit: Payer: Medicare Other

## 2013-01-10 ENCOUNTER — Ambulatory Visit: Payer: Medicare Other

## 2013-01-11 ENCOUNTER — Ambulatory Visit: Payer: Medicare Other

## 2013-01-14 ENCOUNTER — Ambulatory Visit: Payer: Medicare Other

## 2013-01-15 ENCOUNTER — Ambulatory Visit: Payer: Medicare Other

## 2013-01-16 ENCOUNTER — Ambulatory Visit: Payer: Medicare Other

## 2013-01-17 ENCOUNTER — Encounter: Payer: Self-pay | Admitting: Radiation Oncology

## 2013-01-17 ENCOUNTER — Ambulatory Visit: Payer: Medicare Other

## 2013-01-25 ENCOUNTER — Encounter: Payer: Self-pay | Admitting: Radiation Oncology

## 2013-01-25 ENCOUNTER — Ambulatory Visit
Admission: RE | Admit: 2013-01-25 | Discharge: 2013-01-25 | Disposition: A | Discharge status: Home / Self Care | Payer: Medicare Other | Source: Ambulatory Visit | Attending: Radiation Oncology | Admitting: Radiation Oncology

## 2013-01-25 HISTORY — DX: Personal history of irradiation: Z92.3

## 2013-01-25 HISTORY — DX: Personal history of antineoplastic chemotherapy: Z92.21

## 2013-01-25 NOTE — Progress Notes (Signed)
  Radiation Oncology         [phone] ________________________________  Name: [name] MRN: [id number]  Date: 01/25/2013  DOB: [birth year]  Follow-Up Visit Note  Outpatient  CC: [name], MD  Si Gaul, MD  Diagnosis and Prior Radiotherapy: Lung cancer, lower lobe, right.  Small Cell Lung Cancer, Stage IV  Indication for treatment: Palliative  Radiation treatment dates: 12/06/2012-12/20/2012  Site/dose: Mediastinum, Right Lung / 30 Gy /10 fractions  Narrative:  The patient returns today for routine follow-up. He still has pain in his right side that he is rating at a 5/10. He does have oxycodone for pain and says he usually takes 1-2 a day. He does have shortness of breath with activity. He has a cough and is bringing up yellow sputum. He states he does not have a lot of energy. He denies a sore throat and nausea. He has lost 6 lbs since 12/17/12 and is surprised because he has been eating OK. Getting CT imaging and f/u in Med/onc in a month.                  ALLERGIES:  has No Known Allergies.  Meds: Current Outpatient Prescriptions  Medication Sig Dispense Refill  . olmesartan-hydrochlorothiazide (BENICAR HCT) 40-12.5 MG per tablet Take 0.5 tablets by mouth daily.       Marland Kitchen oxyCODONE (OXY IR/ROXICODONE) 5 MG immediate release tablet Take 1 tablet (5 mg total) by mouth every 6 (six) hours as needed.  60 tablet  0  . vitamin B-12 (CYANOCOBALAMIN) 1000 MCG tablet Take 1,000 mcg by mouth daily.      . Alum & Mag Hydroxide-Simeth (MAGIC MOUTHWASH W/LIDOCAINE) SOLN 1part nystatin,1part Maaloxplus,1part benadryl,3part 2%viscous lidocaine. Swish/swallow 10 mL up to QID, before meals/bedtime  480 mL  2  . sucralfate (CARAFATE) 1 G tablet crush 1 tablet in 10 mL H20 and swallow up to QID prn sore throat  30 tablet  3   No current facility-administered medications for this encounter.    Physical Findings: The patient is in no acute distress. Patient is alert and  oriented.  height is 6\' 1"  (1.854 m) and weight is 234 lb 12.8 oz (106.505 kg). His temperature is 99 F (37.2 C). His blood pressure is 111/65 and his pulse is 102. His oxygen saturation is 98%. .  Lungs CTAB.  Skin intact over torso - mildly hyperpigmented in a patch over right back but not dry. Lab Findings: Lab Results  Component Value Date   WBC 3.5* 11/20/2012   HGB 10.4* 11/20/2012   HCT 30.6* 11/20/2012   MCV 108.5* 11/20/2012   PLT 158 11/20/2012    Radiographic Findings: No results found.  Impression/Plan:  Doing relatively well.  Pain in side is a little worse per patient.  Possibly due to pleural tumor +/- inflammation from RT fields.   Will be able to ascertain disease status at next CT scan. I will see him back PRN.  He knows to call if any questions arise in the future.  I advised him to ask for a nutritionist if weight loss continues.  I spent 15 minutes face to face with the patient and more than 50% of that time was spent in counseling and/or coordination of care. _____________________________________   Lonie Peak, MD

## 2013-01-25 NOTE — Progress Notes (Signed)
[name] here for follow up after treatment to his lower lobe of his right lung.  He has pain in his right side that he is rating at a 5/10.  He does have oxycodone for pain and says he usually takes 1-2 a day.  He does have shortness of breath with activity.  He has a cough and is bringing up yellow sputum.  He states he does not have a lot of energy.  He denies a sore throat and nausea.  He has lost 6 lbs since 12/17/12 and is surprised because he has been eating OK.  He states that his skin is intact on his right chest.

## 2013-02-21 ENCOUNTER — Ambulatory Visit: Payer: Medicare Other | Admitting: Internal Medicine

## 2013-02-22 ENCOUNTER — Other Ambulatory Visit: Payer: Self-pay | Admitting: Internal Medicine

## 2013-02-22 ENCOUNTER — Other Ambulatory Visit: Payer: Self-pay | Admitting: Medical Oncology

## 2013-02-22 ENCOUNTER — Ambulatory Visit (HOSPITAL_COMMUNITY)
Admission: RE | Admit: 2013-02-22 | Discharge: 2013-02-22 | Disposition: A | Discharge status: Home / Self Care | Payer: Medicare Other | Source: Ambulatory Visit | Attending: Internal Medicine | Admitting: Internal Medicine

## 2013-02-22 ENCOUNTER — Telehealth: Payer: Self-pay | Admitting: Medical Oncology

## 2013-02-22 ENCOUNTER — Encounter (HOSPITAL_COMMUNITY): Payer: Self-pay

## 2013-02-22 ENCOUNTER — Other Ambulatory Visit (HOSPITAL_BASED_OUTPATIENT_CLINIC_OR_DEPARTMENT_OTHER): Payer: Medicare Other | Admitting: Lab

## 2013-02-22 DIAGNOSIS — Z923 Personal history of irradiation: Secondary | ICD-10-CM | POA: Insufficient documentation

## 2013-02-22 DIAGNOSIS — C3490 Malignant neoplasm of unspecified part of unspecified bronchus or lung: Secondary | ICD-10-CM

## 2013-02-22 DIAGNOSIS — J90 Pleural effusion, not elsewhere classified: Secondary | ICD-10-CM | POA: Insufficient documentation

## 2013-02-22 DIAGNOSIS — R599 Enlarged lymph nodes, unspecified: Secondary | ICD-10-CM | POA: Insufficient documentation

## 2013-02-22 DIAGNOSIS — C3491 Malignant neoplasm of unspecified part of right bronchus or lung: Secondary | ICD-10-CM

## 2013-02-22 DIAGNOSIS — C772 Secondary and unspecified malignant neoplasm of intra-abdominal lymph nodes: Secondary | ICD-10-CM | POA: Insufficient documentation

## 2013-02-22 DIAGNOSIS — Z9221 Personal history of antineoplastic chemotherapy: Secondary | ICD-10-CM | POA: Insufficient documentation

## 2013-02-22 DIAGNOSIS — N281 Cyst of kidney, acquired: Secondary | ICD-10-CM | POA: Insufficient documentation

## 2013-02-22 HISTORY — DX: Disorder of kidney and ureter, unspecified: N28.9

## 2013-02-22 LAB — CBC WITH DIFFERENTIAL/PLATELET
EOS%: 2.7 % (ref 0.0–7.0)
MCH: 34.4 pg — ABNORMAL HIGH (ref 27.2–33.4)
MCV: 99.3 fL — ABNORMAL HIGH (ref 79.3–98.0)
MONO%: 11.6 % (ref 0.0–14.0)
NEUT#: 4.4 10*3/uL (ref 1.5–6.5)
RBC: 4.1 10*6/uL — ABNORMAL LOW (ref 4.20–5.82)
RDW: 12.8 % (ref 11.0–14.6)

## 2013-02-22 LAB — COMPREHENSIVE METABOLIC PANEL (CC13)
AST: 19 U/L (ref 5–34)
Albumin: 3.1 g/dL — ABNORMAL LOW (ref 3.5–5.0)
Alkaline Phosphatase: 108 U/L (ref 40–150)
Potassium: 4.6 mEq/L (ref 3.5–5.1)
Sodium: 136 mEq/L (ref 136–145)
Total Protein: 7.8 g/dL (ref 6.4–8.3)

## 2013-02-22 NOTE — Progress Notes (Signed)
Quick Note:  Call patient with the result and Encourage PO hydration. May consider for Zometa next week if no improvement. Repeat B Met early next week ______

## 2013-02-22 NOTE — Telephone Encounter (Signed)
Message copied by Charma Igo on Fri Feb 22, 2013 10:03 AM ------      Message from: Si Gaul      Created: Fri Feb 22, 2013  9:38 AM       Call patient with the result and Encourage PO hydration. May consider for Zometa next week if no improvement. Repeat B Met early next week ------

## 2013-02-22 NOTE — Telephone Encounter (Signed)
Wife notified.

## 2013-02-25 ENCOUNTER — Other Ambulatory Visit (HOSPITAL_BASED_OUTPATIENT_CLINIC_OR_DEPARTMENT_OTHER): Payer: Medicare Other | Admitting: Lab

## 2013-02-25 ENCOUNTER — Encounter: Payer: Self-pay | Admitting: Internal Medicine

## 2013-02-25 ENCOUNTER — Ambulatory Visit (HOSPITAL_BASED_OUTPATIENT_CLINIC_OR_DEPARTMENT_OTHER): Payer: Medicare Other | Admitting: Internal Medicine

## 2013-02-25 ENCOUNTER — Telehealth: Payer: Self-pay | Admitting: Internal Medicine

## 2013-02-25 ENCOUNTER — Telehealth: Payer: Self-pay | Admitting: *Deleted

## 2013-02-25 VITALS — BP 110/50 | HR 108 | Temp 98.3°F | Resp 18 | Ht 72.0 in | Wt 231.5 lb

## 2013-02-25 DIAGNOSIS — C3491 Malignant neoplasm of unspecified part of right bronchus or lung: Secondary | ICD-10-CM

## 2013-02-25 DIAGNOSIS — C3490 Malignant neoplasm of unspecified part of unspecified bronchus or lung: Secondary | ICD-10-CM

## 2013-02-25 LAB — BASIC METABOLIC PANEL (CC13)
BUN: 34.7 mg/dL — ABNORMAL HIGH (ref 7.0–26.0)
Calcium: 10.9 mg/dL — ABNORMAL HIGH (ref 8.4–10.4)
Potassium: 4.4 mEq/L (ref 3.5–5.1)

## 2013-02-25 LAB — CBC WITH DIFFERENTIAL/PLATELET
Basophils Absolute: 0 10*3/uL (ref 0.0–0.1)
EOS%: 3.7 % (ref 0.0–7.0)
HGB: 14 g/dL (ref 13.0–17.1)
LYMPH%: 10.4 % — ABNORMAL LOW (ref 14.0–49.0)
MCH: 34.3 pg — ABNORMAL HIGH (ref 27.2–33.4)
MCV: 98.1 fL — ABNORMAL HIGH (ref 79.3–98.0)
MONO%: 11.4 % (ref 0.0–14.0)
RBC: 4.08 10*6/uL — ABNORMAL LOW (ref 4.20–5.82)
RDW: 12.9 % (ref 11.0–14.6)

## 2013-02-25 MED ORDER — OXYCODONE HCL 10 MG PO TABS: ORAL_TABLET | ORAL | Status: DC

## 2013-02-25 NOTE — Patient Instructions (Signed)
CURRENT THERAPY: Topotecan 3 MG/M2 on days 1, 8 and 15 every 4 weeks. First cycle expected to start on 02/27/2013  CHEMOTHERAPY INTENT: Palliative  CURRENT # OF CHEMOTHERAPY CYCLES: 0  CURRENT ANTIEMETICS: Compazine  CURRENT SMOKING STATUS: Former smoker  ORAL CHEMOTHERAPY AND CONSENT: None  CURRENT BISPHOSPHONATES USE: None  PAIN MANAGEMENT: 7/10  NARCOTICS INDUCED CONSTIPATION: None  LIVING WILL AND CODE STATUS: No CODE BLUE

## 2013-02-25 NOTE — Progress Notes (Signed)
[facility] [phone]   [phone]  OFFICE PROGRESS NOTE  [name], MD [phone] [address] [place] [address]  DIAGNOSIS: Extensive stage small cell lung cancer diagnosed in August of 2013.   PRIOR THERAPY:  1) Systemic chemotherapy in the form of carboplatin for an AUC of 5 given on day 1 and etoposide 100 mg per meter squared given on days 1, 2 and 3 with Neulasta support given on day 4. Status post 2 cycle, discontinued today secondary to disease progression.  2) Systemic chemotherapy with cisplatin 30 mg/M2 and irinotecan 65 mg/M2 on days 1 and 8 every 3 weeks. Status post 5 cycles, last dose was given 07/09/2012 discontinued secondary to disease progression.  3) Systemic chemotherapy with paclitaxel 70 mg/M2 given weekly. Status post 11 weekly cycles. 4) palliative radiotherapy to the progressive disease in the right lung and mediastinum under the care of Dr. Basilio Cairo completed on 12/20/2012.    CURRENT THERAPY: Topotecan 3 MG/M2 on days 1, 8 and 15 every 4 weeks. First cycle expected to start on 02/27/2013  CHEMOTHERAPY INTENT: Palliative  CURRENT # OF CHEMOTHERAPY CYCLES: 0  CURRENT ANTIEMETICS: Compazine  CURRENT SMOKING STATUS: Former smoker  ORAL CHEMOTHERAPY AND CONSENT: None  CURRENT BISPHOSPHONATES USE: None  PAIN MANAGEMENT: 7/10  NARCOTICS INDUCED CONSTIPATION: None  LIVING WILL AND CODE STATUS: No CODE BLUE  INTERVAL HISTORY: [name] 72 y.o. male returns to the clinic today for followup visit. The patient has been complaining of increasing pain in the right side of his chest rated 7/10. He has been OxyIR 5 mg by mouth every 6 hours but requires increase in his dose. The patient also lost around 15 pounds since his last visit. He denied having any significant shortness breath, cough or hemoptysis. He denied having any nausea or vomiting. He completed a course of palliative radiotherapy to the right lung and  mediastinum under the care of Dr. Basilio Cairo in July of 2014 and he is here with repeat CT scan of the chest, abdomen and pelvis for restaging of his disease.  MEDICAL HISTORY: Past Medical History  Diagnosis Date  . Hypertension   . ALLERGIC RHINITIS   . Malignant neoplasm of bronchus and lung, unspecified site 01/23/2012  . S/P radiation therapy  12/06/2012-12/20/2012    Mediastinum, Right Lung / 30 Gy /10 fractions  . Status post chemotherapy     2 cycles Of Carboplatin/Etoposide - discontinued due to diesease progression: 5 cycles of Cisplatin and Irinotecan: disease progression: 11 cycles of Paclitaxel   . Renal insufficiency     ALLERGIES:  has No Known Allergies.  MEDICATIONS:  Current Outpatient Prescriptions  Medication Sig Dispense Refill  . Alum & Mag Hydroxide-Simeth (MAGIC MOUTHWASH W/LIDOCAINE) SOLN 1part nystatin,1part Maaloxplus,1part benadryl,3part 2%viscous lidocaine. Swish/swallow 10 mL up to QID, before meals/bedtime  480 mL  2  . olmesartan-hydrochlorothiazide (BENICAR HCT) 40-12.5 MG per tablet Take 0.5 tablets by mouth daily.       Marland Kitchen oxyCODONE (OXY IR/ROXICODONE) 5 MG immediate release tablet Take 1 tablet (5 mg total) by mouth every 6 (six) hours as needed.  60 tablet  0  . sucralfate (CARAFATE) 1 G tablet crush 1 tablet in 10 mL H20 and swallow up to QID prn sore throat  30 tablet  3  . vitamin B-12 (CYANOCOBALAMIN) 1000 MCG tablet Take 1,000 mcg by mouth daily.       No current facility-administered medications for this visit.  SURGICAL HISTORY:  Past Surgical History  Procedure Laterality Date  . Wrist surgery  1961    right wrist  . Orif ankle fracture  05/07/2012    Procedure: OPEN REDUCTION INTERNAL FIXATION (ORIF) ANKLE FRACTURE;  Surgeon: Mable Paris, MD;  Location: [facility];  Service: Orthopedics;  Laterality: Right;    REVIEW OF SYSTEMS:  Constitutional: positive for anorexia, fatigue and weight loss Eyes: negative Ears, nose,  mouth, throat, and face: negative Respiratory: positive for pleurisy/chest pain Cardiovascular: negative Gastrointestinal: negative Genitourinary:negative Integument/breast: negative Hematologic/lymphatic: negative Musculoskeletal:negative Neurological: negative Behavioral/Psych: negative Endocrine: negative Allergic/Immunologic: negative   PHYSICAL EXAMINATION: General appearance: alert, cooperative and appears stated age Head: Normocephalic, without obvious abnormality, atraumatic Neck: no adenopathy, no JVD and thyroid not enlarged, symmetric, no tenderness/mass/nodules Lymph nodes: Cervical, supraclavicular, and axillary nodes normal. Resp: diminished breath sounds RLL and dullness to percussion RLL Back: symmetric, no curvature. ROM normal. No CVA tenderness. Cardio: regular rate and rhythm, S1, S2 normal, no murmur, click, rub or gallop GI: soft, non-tender; bowel sounds normal; no masses,  no organomegaly Extremities: extremities normal, atraumatic, no cyanosis or edema Neurologic: Alert and oriented X 3, normal strength and tone. Normal symmetric reflexes. Normal coordination and gait  ECOG PERFORMANCE STATUS: 1 - Symptomatic but completely ambulatory  There were no vitals taken for this visit.  LABORATORY DATA: Lab Results  Component Value Date   WBC 5.0 02/25/2013   HGB 14.0 02/25/2013   HCT 40.0 02/25/2013   MCV 98.1* 02/25/2013   PLT 152 02/25/2013      Chemistry      Component Value Date/Time   NA 136 02/22/2013 0843   NA 137 09/18/2012 1250   K 4.6 02/22/2013 0843   K 4.2 09/18/2012 1250   CL 105 11/20/2012 0939   CL 104 09/18/2012 1250   CO2 27 02/22/2013 0843   CO2 27 09/18/2012 1250   BUN 39.8* 02/22/2013 0843   BUN 34* 09/18/2012 1250   CREATININE 2.9* 02/22/2013 0843   CREATININE 2.06* 09/18/2012 1250      Component Value Date/Time   CALCIUM 11.2* 02/22/2013 0843   CALCIUM 10.2 09/18/2012 1250   ALKPHOS 108 02/22/2013 0843   ALKPHOS 79 09/18/2012 1250   AST 19  02/22/2013 0843   AST 14 09/18/2012 1250   ALT 13 02/22/2013 0843   ALT 12 09/18/2012 1250   BILITOT 0.30 02/22/2013 0843   BILITOT 0.4 09/18/2012 1250       RADIOGRAPHIC STUDIES: Ct Chest Wo Contrast  02/22/2013   CLINICAL DATA:  States for small cell lung cancer diagnosed August 2013. Patient undergoing chemotherapy and radiation therapy.  EXAM: CT CHEST, ABDOMEN AND PELVIS WITHOUT CONTRAST  TECHNIQUE: Multidetector CT imaging of the chest, abdomen and pelvis was performed following the standard protocol without IV contrast.  COMPARISON:  CT 11/19/2012  FINDINGS:   CT CHEST FINDINGS  No axillary or supraclavicular lymphadenopathy. Mediastinal lymphadenopathy is similar to prior. Right lower paratracheal lymph node measures 21 mm compared to 29 mm on prior. Right hilar lymph node measures 24 mm compared to 25 mm on prior. No pericardial fluid. There is however increased in adenopathy in the precordial fat space anterior to the right hepatic lobe without a nodular implant measuring 21 mm (image 41) which is not readily evident on prior. Similar inferior adjacent nodule measured 20 mm (image 46, series 2).  There is a enlarged right pleural effusion compared to prior. Masslike peribronchial thickening in the right lower lobe appears slightly  increased measuring 4.6 x 3.5 cm compared to 3.3 x 3.1 cm on prior. The other nodules within the right lower lobe are difficult to measure due to the pleural fluid. Left lung is relatively clear. There is a nodule at the left lung base measuring 8 mm unchanged.    CT ABDOMEN AND PELVIS FINDINGS  Non IV contrast images demonstrate no focal hepatic lesion. Gallbladder, pancreas, spleen are normal. There is mild increase in nodular enlargement of the lateral limb of the left adrenal gland measuring 12 mm compared to 8 mm on prior. Right adrenal gland is stable. Bilateral renal cysts appear benign.  The stomach, small bowel, appendix, colon are unremarkable. There is no  retroperitoneal periportal lymphadenopathy. No free fluid the pelvis. Prostate gland and bladder are normal. Delete that prostate gland and bladder normal. No pelvic lymphadenopathy. Insert negative known  IMPRESSION: CT CHEST IMPRESSION  1. Stable mediastinal lymphadenopathy.  2. Increased right pleural effusion and right lower lobe consolidative mass is concerning for disease progression in the right lower lobe  3. Increased nodal metastasis in the precordial space anterior to the right hepatic lobe.  CT ABDOMEN AND PELVIS IMPRESSION  1. Mild increase in nodular thickening of the lateral limb of the left adrenal glands is concerning for metastasis.   Electronically Signed   By: Genevive Bi M.D.   On: 02/22/2013 10:46    ASSESSMENT AND PLAN:  1) This is a very pleasant 72 years old [place] American male with extensive stage small cell lung cancer status post several chemotherapy regimens as well as palliative radiation. Unfortunately the patient has evidence for disease progression on the recent scan. I reviewed the scan images today and recommended for the patient to consider systemic chemotherapy with single agent topotecan 3 mg/M2 on days 1, 8 and 15 every 4 weeks. I discussed with the patient adverse effect of this treatment including but not limited to alopecia, myelosuppression, nausea and vomiting, peripheral neuropathy, liver or renal dysfunction. He is expected to start the first cycle of this treatment on 02/27/2013. 2) pain management: I would increase his dose of OxyIR to 10 mg by mouth every 6 hours as needed for pain. 3) the patient would come back for followup visit in 2 weeks for evaluation and management any adverse effect of his treatment He was advised to call immediately if he has any concerning symptoms in the interval.  The patient voices understanding of current disease status and treatment options and is in agreement with the current care plan.  All questions were answered.  The patient knows to call the clinic with any problems, questions or concerns. We can certainly see the patient much sooner if necessary.  I spent 15 minutes counseling the patient face to face. The total time spent in the appointment was 25 minutes.

## 2013-02-25 NOTE — Telephone Encounter (Signed)
Per staff message and POF I have scheduled appts.  JMW  

## 2013-02-25 NOTE — Telephone Encounter (Signed)
Gave pt appt for lab and MD , emailed Marcelino Duster regarding chemo on 9/17 and beyond

## 2013-02-27 ENCOUNTER — Other Ambulatory Visit (HOSPITAL_BASED_OUTPATIENT_CLINIC_OR_DEPARTMENT_OTHER): Payer: Medicare Other | Admitting: Lab

## 2013-02-27 ENCOUNTER — Ambulatory Visit (HOSPITAL_BASED_OUTPATIENT_CLINIC_OR_DEPARTMENT_OTHER): Payer: Medicare Other

## 2013-02-27 ENCOUNTER — Other Ambulatory Visit: Payer: Self-pay | Admitting: Medical Oncology

## 2013-02-27 VITALS — BP 107/68 | HR 108 | Temp 98.7°F

## 2013-02-27 DIAGNOSIS — C7B8 Other secondary neuroendocrine tumors: Secondary | ICD-10-CM

## 2013-02-27 DIAGNOSIS — Z5111 Encounter for antineoplastic chemotherapy: Secondary | ICD-10-CM

## 2013-02-27 DIAGNOSIS — C3491 Malignant neoplasm of unspecified part of right bronchus or lung: Secondary | ICD-10-CM

## 2013-02-27 DIAGNOSIS — C7A1 Malignant poorly differentiated neuroendocrine tumors: Secondary | ICD-10-CM

## 2013-02-27 LAB — CBC WITH DIFFERENTIAL/PLATELET
BASO%: 0.2 % (ref 0.0–2.0)
Eosinophils Absolute: 0.2 10*3/uL (ref 0.0–0.5)
LYMPH%: 8.4 % — ABNORMAL LOW (ref 14.0–49.0)
MONO#: 0.8 10*3/uL (ref 0.1–0.9)
NEUT#: 3.2 10*3/uL (ref 1.5–6.5)
Platelets: 155 10*3/uL (ref 140–400)
RBC: 4.01 10*6/uL — ABNORMAL LOW (ref 4.20–5.82)
RDW: 12.5 % (ref 11.0–14.6)
WBC: 4.5 10*3/uL (ref 4.0–10.3)
lymph#: 0.4 10*3/uL — ABNORMAL LOW (ref 0.9–3.3)
nRBC: 0 % (ref 0–0)

## 2013-02-27 LAB — COMPREHENSIVE METABOLIC PANEL (CC13)
ALT: 15 U/L (ref 0–55)
AST: 22 U/L (ref 5–34)
Albumin: 2.9 g/dL — ABNORMAL LOW (ref 3.5–5.0)
CO2: 25 mEq/L (ref 22–29)
Calcium: 10.7 mg/dL — ABNORMAL HIGH (ref 8.4–10.4)
Chloride: 101 mEq/L (ref 98–109)
Potassium: 4.6 mEq/L (ref 3.5–5.1)
Sodium: 134 mEq/L — ABNORMAL LOW (ref 136–145)
Total Protein: 7.3 g/dL (ref 6.4–8.3)

## 2013-02-27 MED ORDER — PROCHLORPERAZINE MALEATE 10 MG PO TABS: 10.0000 mg | ORAL_TABLET | Freq: Four times a day (QID) | ORAL | Status: AC | PRN

## 2013-02-27 MED ORDER — ONDANSETRON 8 MG/50ML IVPB (CHCC)
8.0000 mg | Freq: Once | INTRAVENOUS | Status: AC
  Administered 2013-02-27: 8 mg via INTRAVENOUS

## 2013-02-27 MED ORDER — ONDANSETRON 8 MG/NS 50 ML IVPB
INTRAVENOUS | Status: AC
  Filled 2013-02-27: qty 8

## 2013-02-27 MED ORDER — DEXAMETHASONE SODIUM PHOSPHATE 10 MG/ML IJ SOLN
10.0000 mg | Freq: Once | INTRAMUSCULAR | Status: AC
  Administered 2013-02-27: 10 mg via INTRAVENOUS

## 2013-02-27 MED ORDER — DEXAMETHASONE SODIUM PHOSPHATE 10 MG/ML IJ SOLN
INTRAMUSCULAR | Status: AC
  Filled 2013-02-27: qty 1

## 2013-02-27 MED ORDER — TOPOTECAN HCL CHEMO INJECTION 4 MG
3.0000 mg/m2 | Freq: Once | INTRAVENOUS | Status: AC
  Administered 2013-02-27: 6.9 mg via INTRAVENOUS
  Filled 2013-02-27: qty 6.9

## 2013-02-27 MED ORDER — SODIUM CHLORIDE 0.9 % IV SOLN
Freq: Once | INTRAVENOUS | Status: AC
  Administered 2013-02-27: 09:00:00 via INTRAVENOUS

## 2013-02-27 NOTE — Telephone Encounter (Signed)
Called to pharmacy 

## 2013-02-27 NOTE — Progress Notes (Signed)
Printed information given to pt and reviewed on Topotecan.

## 2013-02-27 NOTE — Patient Instructions (Addendum)
[facility] Discharge Instructions for Patients Receiving Chemotherapy  Today you received the following chemotherapy agents topotecan.  To help prevent nausea and vomiting after your treatment, we encourage you to take your nausea medication as prescribed.   If you develop nausea and vomiting that is not controlled by your nausea medication, call the clinic.   BELOW ARE SYMPTOMS THAT SHOULD BE REPORTED IMMEDIATELY:  *FEVER GREATER THAN 100.5 F  *CHILLS WITH OR WITHOUT FEVER  NAUSEA AND VOMITING THAT IS NOT CONTROLLED WITH YOUR NAUSEA MEDICATION  *UNUSUAL SHORTNESS OF BREATH  *UNUSUAL BRUISING OR BLEEDING  TENDERNESS IN MOUTH AND THROAT WITH OR WITHOUT PRESENCE OF ULCERS  *URINARY PROBLEMS  *BOWEL PROBLEMS  UNUSUAL RASH Items with * indicate a potential emergency and should be followed up as soon as possible.  Feel free to call the clinic you have any questions or concerns. The clinic phone number is [phone].

## 2013-02-28 ENCOUNTER — Telehealth: Payer: Self-pay | Admitting: *Deleted

## 2013-02-28 NOTE — Telephone Encounter (Signed)
Multiple attempts made to reach patient today for chemo follow up call without success.

## 2013-03-06 ENCOUNTER — Other Ambulatory Visit (HOSPITAL_BASED_OUTPATIENT_CLINIC_OR_DEPARTMENT_OTHER): Payer: Medicare Other | Admitting: Lab

## 2013-03-06 ENCOUNTER — Ambulatory Visit: Payer: Medicare Other

## 2013-03-06 DIAGNOSIS — C3430 Malignant neoplasm of lower lobe, unspecified bronchus or lung: Secondary | ICD-10-CM

## 2013-03-06 DIAGNOSIS — C3491 Malignant neoplasm of unspecified part of right bronchus or lung: Secondary | ICD-10-CM

## 2013-03-06 LAB — CBC WITH DIFFERENTIAL/PLATELET
BASO%: 0.3 % (ref 0.0–2.0)
EOS%: 4.6 % (ref 0.0–7.0)
LYMPH%: 18.2 % (ref 14.0–49.0)
MCH: 33.4 pg (ref 27.2–33.4)
MCHC: 34.6 g/dL (ref 32.0–36.0)
MONO#: 0.3 10*3/uL (ref 0.1–0.9)
RBC: 3.77 10*6/uL — ABNORMAL LOW (ref 4.20–5.82)
WBC: 3.2 10*3/uL — ABNORMAL LOW (ref 4.0–10.3)
lymph#: 0.6 10*3/uL — ABNORMAL LOW (ref 0.9–3.3)
nRBC: 0 % (ref 0–0)

## 2013-03-06 NOTE — Progress Notes (Signed)
Per Dr Arbutus Ped, cancel treatment today and pt will keep 03/13/13 schedule. Platelets of 80.

## 2013-03-13 ENCOUNTER — Ambulatory Visit (HOSPITAL_BASED_OUTPATIENT_CLINIC_OR_DEPARTMENT_OTHER): Payer: Medicare Other | Admitting: Physician Assistant

## 2013-03-13 ENCOUNTER — Other Ambulatory Visit (HOSPITAL_BASED_OUTPATIENT_CLINIC_OR_DEPARTMENT_OTHER): Payer: Medicare Other | Admitting: Lab

## 2013-03-13 ENCOUNTER — Encounter: Payer: Self-pay | Admitting: Physician Assistant

## 2013-03-13 ENCOUNTER — Ambulatory Visit (HOSPITAL_BASED_OUTPATIENT_CLINIC_OR_DEPARTMENT_OTHER): Payer: Medicare Other

## 2013-03-13 ENCOUNTER — Telehealth: Payer: Self-pay | Admitting: Internal Medicine

## 2013-03-13 ENCOUNTER — Telehealth: Payer: Self-pay | Admitting: *Deleted

## 2013-03-13 VITALS — BP 98/67 | HR 103 | Temp 97.7°F | Resp 19 | Ht 72.0 in | Wt 234.0 lb

## 2013-03-13 DIAGNOSIS — C3430 Malignant neoplasm of lower lobe, unspecified bronchus or lung: Secondary | ICD-10-CM

## 2013-03-13 DIAGNOSIS — C3491 Malignant neoplasm of unspecified part of right bronchus or lung: Secondary | ICD-10-CM

## 2013-03-13 DIAGNOSIS — C3490 Malignant neoplasm of unspecified part of unspecified bronchus or lung: Secondary | ICD-10-CM

## 2013-03-13 DIAGNOSIS — Z5111 Encounter for antineoplastic chemotherapy: Secondary | ICD-10-CM

## 2013-03-13 LAB — COMPREHENSIVE METABOLIC PANEL (CC13)
AST: 24 U/L (ref 5–34)
Albumin: 2.8 g/dL — ABNORMAL LOW (ref 3.5–5.0)
BUN: 41 mg/dL — ABNORMAL HIGH (ref 7.0–26.0)
Calcium: 10.9 mg/dL — ABNORMAL HIGH (ref 8.4–10.4)
Chloride: 102 mEq/L (ref 98–109)
Creatinine: 2.3 mg/dL — ABNORMAL HIGH (ref 0.7–1.3)
Glucose: 129 mg/dl (ref 70–140)
Potassium: 4.9 mEq/L (ref 3.5–5.1)
Total Protein: 7.4 g/dL (ref 6.4–8.3)

## 2013-03-13 LAB — CBC WITH DIFFERENTIAL/PLATELET
BASO%: 0.3 % (ref 0.0–2.0)
Basophils Absolute: 0 10*3/uL (ref 0.0–0.1)
HCT: 30.3 % — ABNORMAL LOW (ref 38.4–49.9)
LYMPH%: 16.2 % (ref 14.0–49.0)
MCH: 32.7 pg (ref 27.2–33.4)
MCHC: 34.7 g/dL (ref 32.0–36.0)
MCV: 94.4 fL (ref 79.3–98.0)
MONO#: 0.3 10*3/uL (ref 0.1–0.9)
NEUT%: 71.6 % (ref 39.0–75.0)
Platelets: 98 10*3/uL — ABNORMAL LOW (ref 140–400)
RBC: 3.21 10*6/uL — ABNORMAL LOW (ref 4.20–5.82)
WBC: 3.8 10*3/uL — ABNORMAL LOW (ref 4.0–10.3)

## 2013-03-13 MED ORDER — ONDANSETRON 8 MG/NS 50 ML IVPB
INTRAVENOUS | Status: AC
  Filled 2013-03-13: qty 8

## 2013-03-13 MED ORDER — TOPOTECAN HCL CHEMO INJECTION 4 MG
2.5000 mg/m2 | Freq: Once | INTRAVENOUS | Status: AC
  Administered 2013-03-13: 5.8 mg via INTRAVENOUS
  Filled 2013-03-13: qty 5.8

## 2013-03-13 MED ORDER — SODIUM CHLORIDE 0.9 % IV SOLN
Freq: Once | INTRAVENOUS | Status: AC
  Administered 2013-03-13: 11:00:00 via INTRAVENOUS

## 2013-03-13 MED ORDER — DEXAMETHASONE SODIUM PHOSPHATE 10 MG/ML IJ SOLN
INTRAMUSCULAR | Status: AC
  Filled 2013-03-13: qty 1

## 2013-03-13 MED ORDER — ONDANSETRON 8 MG/50ML IVPB (CHCC)
8.0000 mg | Freq: Once | INTRAVENOUS | Status: AC
  Administered 2013-03-13: 8 mg via INTRAVENOUS

## 2013-03-13 MED ORDER — METHYLPREDNISOLONE 4 MG PO KIT: PACK | ORAL | Status: DC

## 2013-03-13 MED ORDER — DEXAMETHASONE SODIUM PHOSPHATE 10 MG/ML IJ SOLN
10.0000 mg | Freq: Once | INTRAMUSCULAR | Status: AC
  Administered 2013-03-13: 10 mg via INTRAVENOUS

## 2013-03-13 NOTE — Telephone Encounter (Signed)
Per staff message and POF I have scheduled appts.  JMW  

## 2013-03-13 NOTE — Patient Instructions (Addendum)
Take the Medrol Dosepak as prescribed with food Followup in 2 weeks at the start of your next scheduled cycle of chemotherapy

## 2013-03-13 NOTE — Patient Instructions (Signed)
[facility] Discharge Instructions for Patients Receiving Chemotherapy  Today you received the following chemotherapy agents topotecan  To help prevent nausea and vomiting after your treatment, we encourage you to take your nausea medication as needed   If you develop nausea and vomiting that is not controlled by your nausea medication, call the clinic.   BELOW ARE SYMPTOMS THAT SHOULD BE REPORTED IMMEDIATELY:  *FEVER GREATER THAN 100.5 F  *CHILLS WITH OR WITHOUT FEVER  NAUSEA AND VOMITING THAT IS NOT CONTROLLED WITH YOUR NAUSEA MEDICATION  *UNUSUAL SHORTNESS OF BREATH  *UNUSUAL BRUISING OR BLEEDING  TENDERNESS IN MOUTH AND THROAT WITH OR WITHOUT PRESENCE OF ULCERS  *URINARY PROBLEMS  *BOWEL PROBLEMS  UNUSUAL RASH Items with * indicate a potential emergency and should be followed up as soon as possible.  Feel free to call the clinic you have any questions or concerns. The clinic phone number is [phone].

## 2013-03-13 NOTE — Progress Notes (Signed)
Per Dr Donnald Garre, okay to tx with plts 98

## 2013-03-13 NOTE — Telephone Encounter (Signed)
gv and printed appt sched and avs for pt for OCT...MW added tx.  °

## 2013-03-13 NOTE — Progress Notes (Addendum)
[facility] [phone]   [phone]  SHARED VISIT PROGRESS NOTE  [name], MD [phone] [place] [address]  DIAGNOSIS: Extensive stage small cell lung cancer diagnosed in August of 2013.   PRIOR THERAPY:  1) Systemic chemotherapy in the form of carboplatin for an AUC of 5 given on day 1 and etoposide 100 mg per meter squared given on days 1, 2 and 3 with Neulasta support given on day 4. Status post 2 cycle, discontinued today secondary to disease progression.  2) Systemic chemotherapy with cisplatin 30 mg/M2 and irinotecan 65 mg/M2 on days 1 and 8 every 3 weeks. Status post 5 cycles, last dose was given 07/09/2012 discontinued secondary to disease progression.  3) Systemic chemotherapy with paclitaxel 70 mg/M2 given weekly. Status post 11 weekly cycles. 4) palliative radiotherapy to the progressive disease in the right lung and mediastinum under the care of Dr. Basilio Cairo completed on 12/20/2012.    CURRENT THERAPY: Topotecan 3 MG/M2 on days 1, 8 and 15 every 4 weeks. First cycle started on 02/27/2013, day 8 cycle 1 he'll do to low platelet count  CHEMOTHERAPY INTENT: Palliative  CURRENT # OF CHEMOTHERAPY CYCLES: 1  CURRENT ANTIEMETICS: Compazine  CURRENT SMOKING STATUS: Former smoker  ORAL CHEMOTHERAPY AND CONSENT: None  CURRENT BISPHOSPHONATES USE: None  PAIN MANAGEMENT: 7/10  NARCOTICS INDUCED CONSTIPATION: None  LIVING WILL AND CODE STATUS: No CODE BLUE  INTERVAL HISTORY: [name] 72 y.o. male returns to the clinic today for followup visit. He complains of feeling weak and having no energy. He continues to have some right sided chest pain however it is well manage with his current oxycodone 10 mg tablets. His appetite is fair and he has managed to gain a couple of pounds. He denied having any significant shortness breath, cough or hemoptysis. He denied having any nausea or vomiting. He completed a course of  palliative radiotherapy to the right lung and mediastinum under the care of Dr. Basilio Cairo in July of 2014. He voiced no other specific complaints today.  MEDICAL HISTORY: Past Medical History  Diagnosis Date  . Hypertension   . ALLERGIC RHINITIS   . Malignant neoplasm of bronchus and lung, unspecified site 01/23/2012  . S/P radiation therapy  12/06/2012-12/20/2012    Mediastinum, Right Lung / 30 Gy /10 fractions  . Status post chemotherapy     2 cycles Of Carboplatin/Etoposide - discontinued due to diesease progression: 5 cycles of Cisplatin and Irinotecan: disease progression: 11 cycles of Paclitaxel   . Renal insufficiency     ALLERGIES:  has No Known Allergies.  MEDICATIONS:  Current Outpatient Prescriptions  Medication Sig Dispense Refill  . Alum & Mag Hydroxide-Simeth (MAGIC MOUTHWASH W/LIDOCAINE) SOLN 1part nystatin,1part Maaloxplus,1part benadryl,3part 2%viscous lidocaine. Swish/swallow 10 mL up to QID, before meals/bedtime  480 mL  2  . methylPREDNISolone (MEDROL DOSEPAK) 4 MG tablet follow package directions  21 tablet  0  . olmesartan-hydrochlorothiazide (BENICAR HCT) 40-12.5 MG per tablet Take 0.5 tablets by mouth daily.       Marland Kitchen oxyCODONE 10 MG TABS One tab po q 6 hours as needed for pain  60 tablet  0  . prochlorperazine (COMPAZINE) 10 MG tablet Take 1 tablet (10 mg total) by mouth every 6 (six) hours as needed.  30 tablet  0  . sucralfate (CARAFATE) 1 G tablet crush 1 tablet in 10 mL H20 and swallow up to QID prn sore throat  30 tablet  3  . vitamin B-12 (CYANOCOBALAMIN) 1000 MCG tablet Take 1,000 mcg by mouth daily.       No current facility-administered medications for this visit.   Facility-Administered Medications Ordered in Other Visits  Medication Dose Route Frequency Provider Last Rate Last Dose  . 0.9 %  sodium chloride infusion   Intravenous Once Si Gaul, MD      . ondansetron (ZOFRAN) IVPB 8 mg  8 mg Intravenous Once Si Gaul, MD      .  topotecan (HYCAMTIN) 5.8 mg in sodium chloride 0.9 % 100 mL chemo infusion  2.5 mg/m2 (Treatment Plan Actual) Intravenous Once Si Gaul, MD        SURGICAL HISTORY:  Past Surgical History  Procedure Laterality Date  . Wrist surgery  1961    right wrist  . Orif ankle fracture  05/07/2012    Procedure: OPEN REDUCTION INTERNAL FIXATION (ORIF) ANKLE FRACTURE;  Surgeon: Mable Paris, MD;  Location: [facility];  Service: Orthopedics;  Laterality: Right;    REVIEW OF SYSTEMS:  Constitutional: positive for anorexia and fatigue Eyes: negative Ears, nose, mouth, throat, and face: negative Respiratory: positive for pleurisy/chest pain Cardiovascular: negative Gastrointestinal: negative Genitourinary:negative Integument/breast: negative Hematologic/lymphatic: negative Musculoskeletal:negative Neurological: negative Behavioral/Psych: negative Endocrine: negative Allergic/Immunologic: negative   PHYSICAL EXAMINATION: General appearance: alert, cooperative and appears stated age Head: Normocephalic, without obvious abnormality, atraumatic Neck: no adenopathy, no JVD and thyroid not enlarged, symmetric, no tenderness/mass/nodules Lymph nodes: Cervical, supraclavicular, and axillary nodes normal. Resp: diminished breath sounds RLL and dullness to percussion RLL Back: symmetric, no curvature. ROM normal. No CVA tenderness. Cardio: regular rate and rhythm, S1, S2 normal, no murmur, click, rub or gallop GI: soft, non-tender; bowel sounds normal; no masses,  no organomegaly Extremities: extremities normal, atraumatic, no cyanosis or edema Neurologic: Alert and oriented X 3, normal strength and tone. Normal symmetric reflexes. Normal coordination and gait  ECOG PERFORMANCE STATUS: 1 - Symptomatic but completely ambulatory  Blood pressure 98/67, pulse 103, temperature 97.7 F (36.5 C), temperature source Oral, resp. rate 19, height 6' (1.829 m), weight 234 lb (106.142  kg).  LABORATORY DATA: Lab Results  Component Value Date   WBC 3.8* 03/13/2013   HGB 10.5* 03/13/2013   HCT 30.3* 03/13/2013   MCV 94.4 03/13/2013   PLT 98* 03/13/2013      Chemistry      Component Value Date/Time   NA 134* 02/27/2013 0833   NA 137 09/18/2012 1250   K 4.6 02/27/2013 0833   K 4.2 09/18/2012 1250   CL 105 11/20/2012 0939   CL 104 09/18/2012 1250   CO2 25 02/27/2013 0833   CO2 27 09/18/2012 1250   BUN 34.2* 02/27/2013 0833   BUN 34* 09/18/2012 1250   CREATININE 2.3* 02/27/2013 0833   CREATININE 2.06* 09/18/2012 1250      Component Value Date/Time   CALCIUM 10.7* 02/27/2013 0833   CALCIUM 10.2 09/18/2012 1250   ALKPHOS 102 02/27/2013 0833   ALKPHOS 79 09/18/2012 1250   AST 22 02/27/2013 0833   AST 14 09/18/2012 1250   ALT 15 02/27/2013 0833   ALT 12 09/18/2012 1250   BILITOT 0.32 02/27/2013 0833   BILITOT 0.4 09/18/2012 1250       RADIOGRAPHIC STUDIES: Ct Chest Wo Contrast  02/22/2013   CLINICAL DATA:  States for small cell lung cancer diagnosed August 2013. Patient undergoing chemotherapy and radiation therapy.  EXAM: CT CHEST, ABDOMEN AND PELVIS WITHOUT CONTRAST  TECHNIQUE: Multidetector CT imaging of the  chest, abdomen and pelvis was performed following the standard protocol without IV contrast.  COMPARISON:  CT 11/19/2012  FINDINGS:   CT CHEST FINDINGS  No axillary or supraclavicular lymphadenopathy. Mediastinal lymphadenopathy is similar to prior. Right lower paratracheal lymph node measures 21 mm compared to 29 mm on prior. Right hilar lymph node measures 24 mm compared to 25 mm on prior. No pericardial fluid. There is however increased in adenopathy in the precordial fat space anterior to the right hepatic lobe without a nodular implant measuring 21 mm (image 41) which is not readily evident on prior. Similar inferior adjacent nodule measured 20 mm (image 46, series 2).  There is a enlarged right pleural effusion compared to prior. Masslike peribronchial thickening in the right lower  lobe appears slightly increased measuring 4.6 x 3.5 cm compared to 3.3 x 3.1 cm on prior. The other nodules within the right lower lobe are difficult to measure due to the pleural fluid. Left lung is relatively clear. There is a nodule at the left lung base measuring 8 mm unchanged.    CT ABDOMEN AND PELVIS FINDINGS  Non IV contrast images demonstrate no focal hepatic lesion. Gallbladder, pancreas, spleen are normal. There is mild increase in nodular enlargement of the lateral limb of the left adrenal gland measuring 12 mm compared to 8 mm on prior. Right adrenal gland is stable. Bilateral renal cysts appear benign.  The stomach, small bowel, appendix, colon are unremarkable. There is no retroperitoneal periportal lymphadenopathy. No free fluid the pelvis. Prostate gland and bladder are normal. Delete that prostate gland and bladder normal. No pelvic lymphadenopathy. Insert negative known  IMPRESSION: CT CHEST IMPRESSION  1. Stable mediastinal lymphadenopathy.  2. Increased right pleural effusion and right lower lobe consolidative mass is concerning for disease progression in the right lower lobe  3. Increased nodal metastasis in the precordial space anterior to the right hepatic lobe.  CT ABDOMEN AND PELVIS IMPRESSION  1. Mild increase in nodular thickening of the lateral limb of the left adrenal glands is concerning for metastasis.   Electronically Signed   By: Genevive Bi M.D.   On: 02/22/2013 10:46    ASSESSMENT AND PLAN:  1) This is a very pleasant 72 years old [place] American male with extensive stage small cell lung cancer status post several chemotherapy regimens as well as palliative radiation. The patient had evidence for disease progression on the recent scan. He is now being treated with systemic chemotherapy with single agent topotecan 3 mg/M2 on days 1, 8 and 15 every 4 weeks. Status post day 1 of cycle 1, day 8 was missed due to low platelet count of 80,000. His plantlet count has improved  to 98,000 today and he will receive day 15 of cycle 1 but at a reduced dose of topotecan of 2.5 mg/m2. The patient was discussed with him also seen by Dr. Arbutus Ped. He will return in 2 weeks prior to the start of cycle #2 of reduced dose topotecan at 2.5 mg per meter squared given on days 1, 8 and 15 every 4 weeks. 2) pain management:he will continue his dose of OxyIR to 10 mg by mouth every 6 hours as needed for pain. 3) To help stimulate his appetite and improve his energy level a bit we are in place him on a Medrol Dosepak. A prescription for Medrol Dosepak was sent to his pharmacy of record via E. scribed.  [name] E, PA-C   He was advised to call immediately if  he has any concerning symptoms in the interval.  The patient voices understanding of current disease status and treatment options and is in agreement with the current care plan.  All questions were answered. The patient knows to call the clinic with any problems, questions or concerns. We can certainly see the patient much sooner if necessary.  ADDENDUM: Hematology/Oncology Attending: I have a face to face encounter with the patient today. I recommended his care plan. This is a very pleasant 72 years old [place] American male with extensive stage small cell lung cancer diagnosed in August of 2016 status post several chemotherapy regimen and currently undergoing treatment with reduced dose topotecan, status post 1 cycle. He missed day 8 of the first cycle secondary to thrombocytopenia. We will proceed with day 15 today for the first cycle as scheduled. The patient would come back for followup visit in 2 weeks with the start of cycle #2. He was advised to call immediately if he has any concerning symptoms in the interval. Lajuana Matte., MD 03/13/2013

## 2013-03-18 ENCOUNTER — Telehealth: Payer: Self-pay | Admitting: Dietician

## 2013-03-20 ENCOUNTER — Other Ambulatory Visit: Payer: Medicare Other | Admitting: Lab

## 2013-03-27 ENCOUNTER — Ambulatory Visit (HOSPITAL_BASED_OUTPATIENT_CLINIC_OR_DEPARTMENT_OTHER): Payer: Medicare Other

## 2013-03-27 ENCOUNTER — Ambulatory Visit (HOSPITAL_BASED_OUTPATIENT_CLINIC_OR_DEPARTMENT_OTHER): Payer: Medicare Other | Admitting: Internal Medicine

## 2013-03-27 ENCOUNTER — Encounter: Payer: Self-pay | Admitting: Internal Medicine

## 2013-03-27 ENCOUNTER — Telehealth: Payer: Self-pay | Admitting: *Deleted

## 2013-03-27 ENCOUNTER — Other Ambulatory Visit: Payer: Medicare Other | Admitting: Lab

## 2013-03-27 ENCOUNTER — Telehealth: Payer: Self-pay | Admitting: Internal Medicine

## 2013-03-27 ENCOUNTER — Other Ambulatory Visit (HOSPITAL_BASED_OUTPATIENT_CLINIC_OR_DEPARTMENT_OTHER): Payer: Medicare Other | Admitting: Lab

## 2013-03-27 VITALS — BP 93/59 | HR 115 | Temp 98.3°F | Resp 18 | Ht 72.0 in | Wt 223.2 lb

## 2013-03-27 DIAGNOSIS — E860 Dehydration: Secondary | ICD-10-CM

## 2013-03-27 DIAGNOSIS — C3491 Malignant neoplasm of unspecified part of right bronchus or lung: Secondary | ICD-10-CM

## 2013-03-27 DIAGNOSIS — C3490 Malignant neoplasm of unspecified part of unspecified bronchus or lung: Secondary | ICD-10-CM

## 2013-03-27 DIAGNOSIS — R109 Unspecified abdominal pain: Secondary | ICD-10-CM

## 2013-03-27 LAB — CBC WITH DIFFERENTIAL/PLATELET
BASO%: 0.4 % (ref 0.0–2.0)
Basophils Absolute: 0 10*3/uL (ref 0.0–0.1)
EOS%: 0.7 % (ref 0.0–7.0)
Eosinophils Absolute: 0 10*3/uL (ref 0.0–0.5)
HCT: 26.4 % — ABNORMAL LOW (ref 38.4–49.9)
HGB: 9.1 g/dL — ABNORMAL LOW (ref 13.0–17.1)
MONO#: 0.7 10*3/uL (ref 0.1–0.9)
NEUT%: 81.6 % — ABNORMAL HIGH (ref 39.0–75.0)
RBC: 2.75 10*6/uL — ABNORMAL LOW (ref 4.20–5.82)
WBC: 6.4 10*3/uL (ref 4.0–10.3)
lymph#: 0.4 10*3/uL — ABNORMAL LOW (ref 0.9–3.3)

## 2013-03-27 LAB — COMPREHENSIVE METABOLIC PANEL (CC13)
ALT: 13 U/L (ref 0–55)
Anion Gap: 10 mEq/L (ref 3–11)
BUN: 37.7 mg/dL — ABNORMAL HIGH (ref 7.0–26.0)
CO2: 25 mEq/L (ref 22–29)
Calcium: 11 mg/dL — ABNORMAL HIGH (ref 8.4–10.4)
Chloride: 100 mEq/L (ref 98–109)
Creatinine: 2.5 mg/dL — ABNORMAL HIGH (ref 0.7–1.3)
Potassium: 4.5 mEq/L (ref 3.5–5.1)
Sodium: 135 mEq/L — ABNORMAL LOW (ref 136–145)
Total Bilirubin: 0.48 mg/dL (ref 0.20–1.20)

## 2013-03-27 MED ORDER — OXYCODONE-ACETAMINOPHEN 5-325 MG PO TABS
2.0000 | ORAL_TABLET | Freq: Once | ORAL | Status: AC
  Administered 2013-03-27: 2 via ORAL

## 2013-03-27 MED ORDER — OXYCODONE-ACETAMINOPHEN 5-325 MG PO TABS
ORAL_TABLET | ORAL | Status: AC
  Filled 2013-03-27: qty 2

## 2013-03-27 MED ORDER — SODIUM CHLORIDE 0.9 % IV SOLN
Freq: Once | INTRAVENOUS | Status: AC
  Administered 2013-03-27: 12:00:00 via INTRAVENOUS

## 2013-03-27 NOTE — Patient Instructions (Signed)
Dehydration, Adult Dehydration is when you lose more fluids from the body than you take in. Vital organs like the kidneys, brain, and heart cannot function without a proper amount of fluids and salt. Any loss of fluids from the body can cause dehydration.  CAUSES   Vomiting.  Diarrhea.  Excessive sweating.  Excessive urine output.  Fever. SYMPTOMS  Mild dehydration  Thirst.  Dry lips.  Slightly dry mouth. Moderate dehydration  Very dry mouth.  Sunken eyes.  Skin does not bounce back quickly when lightly pinched and released.  Dark urine and decreased urine production.  Decreased tear production.  Headache. Severe dehydration  Very dry mouth.  Extreme thirst.  Rapid, weak pulse (more than 100 beats per minute at rest).  Cold hands and feet.  Not able to sweat in spite of heat and temperature.  Rapid breathing.  Blue lips.  Confusion and lethargy.  Difficulty being awakened.  Minimal urine production.  No tears. DIAGNOSIS  Your caregiver will diagnose dehydration based on your symptoms and your exam. Blood and urine tests will help confirm the diagnosis. The diagnostic evaluation should also identify the cause of dehydration. TREATMENT  Treatment of mild or moderate dehydration can often be done at home by increasing the amount of fluids that you drink. It is best to drink small amounts of fluid more often. Drinking too much at one time can make vomiting worse. Refer to the home care instructions below. Severe dehydration needs to be treated at the hospital where you will probably be given intravenous (IV) fluids that contain water and electrolytes. HOME CARE INSTRUCTIONS   Ask your caregiver about specific rehydration instructions.  Drink enough fluids to keep your urine clear or pale yellow.  Drink small amounts frequently if you have nausea and vomiting.  Eat as you normally do.  Avoid:  Foods or drinks high in sugar.  Carbonated  drinks.  Juice.  Extremely hot or cold fluids.  Drinks with caffeine.  Fatty, greasy foods.  Alcohol.  Tobacco.  Overeating.  Gelatin desserts.  Wash your hands well to avoid spreading bacteria and viruses.  Only take over-the-counter or prescription medicines for pain, discomfort, or fever as directed by your caregiver.  Ask your caregiver if you should continue all prescribed and over-the-counter medicines.  Keep all follow-up appointments with your caregiver. SEEK MEDICAL CARE IF:  You have abdominal pain and it increases or stays in one area (localizes).  You have a rash, stiff neck, or severe headache.  You are irritable, sleepy, or difficult to awaken.  You are weak, dizzy, or extremely thirsty. SEEK IMMEDIATE MEDICAL CARE IF:   You are unable to keep fluids down or you get worse despite treatment.  You have frequent episodes of vomiting or diarrhea.  You have blood or green matter (bile) in your vomit.  You have blood in your stool or your stool looks black and tarry.  You have not urinated in 6 to 8 hours, or you have only urinated a small amount of very dark urine.  You have a fever.  You faint. MAKE SURE YOU:   Understand these instructions.  Will watch your condition.  Will get help right away if you are not doing well or get worse. Document Released: 05/30/2005 Document Revised: 08/22/2011 Document Reviewed: 01/17/2011 [employer] Patient Information 2014 [employer].  

## 2013-03-27 NOTE — Progress Notes (Signed)
Pt c/o pain on right upper quadrant of abdomen -  Rated 7/10 .  Pt stated he takes Oxycodone 10 mg at home.  Dr. Arbutus Ped notified.  Order received for Oxycodone 10 mg only if pt has a ride home after IVF.  If no ride available, give Oxycodone  5 mg only.   Called wife at home and confirmed that she will be coming to pick up pt after IVF.  Percocet 10 mg given as ordered. 1420  -   Pt stated " I could hardly feel the pain at all ".  Pt did not rate pain level post med ;  Pt stated he had relief of abdominal pain.

## 2013-03-27 NOTE — Progress Notes (Signed)
[facility] Telephone:(336) [phone]   Fax:([phone]  OFFICE PROGRESS NOTE  [name], MD [phone] [place] [address]  DIAGNOSIS: Extensive stage small cell lung cancer diagnosed in August of 2013.   PRIOR THERAPY:  1) Systemic chemotherapy in the form of carboplatin for an AUC of 5 given on day 1 and etoposide 100 mg per meter squared given on days 1, 2 and 3 with Neulasta support given on day 4. Status post 2 cycle, discontinued today secondary to disease progression.  2) Systemic chemotherapy with cisplatin 30 mg/M2 and irinotecan 65 mg/M2 on days 1 and 8 every 3 weeks. Status post 5 cycles, last dose was given 07/09/2012 discontinued secondary to disease progression.  3) Systemic chemotherapy with paclitaxel 70 mg/M2 given weekly. Status post 11 weekly cycles.  4) palliative radiotherapy to the progressive disease in the right lung and mediastinum under the care of Dr. Basilio Cairo completed on 12/20/2012.   CURRENT THERAPY: Topotecan 3 MG/M2 on days 1, 8 and 15 every 4 weeks. First cycle started on 02/27/2013, day 8 cycle 1 was held secondary to low platelets count  CHEMOTHERAPY INTENT: Palliative  CURRENT # OF CHEMOTHERAPY CYCLES: 1  CURRENT ANTIEMETICS: Compazine  CURRENT SMOKING STATUS: Former smoker  ORAL CHEMOTHERAPY AND CONSENT: None  CURRENT BISPHOSPHONATES USE: None  PAIN MANAGEMENT: 7/10  NARCOTICS INDUCED CONSTIPATION: None  LIVING WILL AND CODE STATUS: No CODE BLUE   INTERVAL HISTORY: [name] 72 y.o. male returns to the clinic today for follow up visit.the patient continues to complain of increasing fatigue and weakness as well as pain on the right side of his chest. He has rough time with his recent chemotherapy with increasing weakness. He also has lack of appetite and poor by mouth intake. He denied having any significant nausea or vomiting. He denied having any fever or chills. The patient denies having any significant  shortness of breath, cough or hemoptysis. He lost few pounds since his last visit. He is here today to resume cycle #2 of his chemotherapy.  MEDICAL HISTORY: Past Medical History  Diagnosis Date  . Hypertension   . ALLERGIC RHINITIS   . Malignant neoplasm of bronchus and lung, unspecified site 01/23/2012  . S/P radiation therapy  12/06/2012-12/20/2012    Mediastinum, Right Lung / 30 Gy /10 fractions  . Status post chemotherapy     2 cycles Of Carboplatin/Etoposide - discontinued due to diesease progression: 5 cycles of Cisplatin and Irinotecan: disease progression: 11 cycles of Paclitaxel   . Renal insufficiency     ALLERGIES:  has No Known Allergies.  MEDICATIONS:  Current Outpatient Prescriptions  Medication Sig Dispense Refill  . oxyCODONE 10 MG TABS One tab po q 6 hours as needed for pain  60 tablet  0  . vitamin B-12 (CYANOCOBALAMIN) 1000 MCG tablet Take 1,000 mcg by mouth daily.      . Alum & Mag Hydroxide-Simeth (MAGIC MOUTHWASH W/LIDOCAINE) SOLN 1part nystatin,1part Maaloxplus,1part benadryl,3part 2%viscous lidocaine. Swish/swallow 10 mL up to QID, before meals/bedtime  480 mL  2  . olmesartan-hydrochlorothiazide (BENICAR HCT) 40-12.5 MG per tablet Take 0.5 tablets by mouth daily.       . prochlorperazine (COMPAZINE) 10 MG tablet Take 1 tablet (10 mg total) by mouth every 6 (six) hours as needed.  30 tablet  0  . sucralfate (CARAFATE) 1 G tablet crush 1 tablet in 10 mL H20 and swallow up to QID prn sore throat  30  tablet  3   No current facility-administered medications for this visit.    SURGICAL HISTORY:  Past Surgical History  Procedure Laterality Date  . Wrist surgery  1961    right wrist  . Orif ankle fracture  05/07/2012    Procedure: OPEN REDUCTION INTERNAL FIXATION (ORIF) ANKLE FRACTURE;  Surgeon: Mable Paris, MD;  Location: [facility];  Service: Orthopedics;  Laterality: Right;    REVIEW OF SYSTEMS:  A comprehensive review of systems was negative  except for: Constitutional: positive for anorexia, fatigue and weight loss Respiratory: positive for pleurisy/chest pain Musculoskeletal: positive for muscle weakness   PHYSICAL EXAMINATION: General appearance: alert, cooperative, fatigued and no distress Head: Normocephalic, without obvious abnormality, atraumatic Neck: no adenopathy, no JVD, supple, symmetrical, trachea midline and thyroid not enlarged, symmetric, no tenderness/mass/nodules Lymph nodes: Cervical, supraclavicular, and axillary nodes normal. Resp: clear to auscultation bilaterally Back: symmetric, no curvature. ROM normal. No CVA tenderness. Cardio: regular rate and rhythm, S1, S2 normal, no murmur, click, rub or gallop GI: soft, non-tender; bowel sounds normal; no masses,  no organomegaly Extremities: extremities normal, atraumatic, no cyanosis or edema  ECOG PERFORMANCE STATUS: 2 - Symptomatic, <50% confined to bed  Blood pressure 93/59, pulse 115, temperature 98.3 F (36.8 C), temperature source Oral, resp. rate 18, height 6' (1.829 m), weight 223 lb 3.2 oz (101.243 kg).  LABORATORY DATA: Lab Results  Component Value Date   WBC 6.4 03/27/2013   HGB 9.1* 03/27/2013   HCT 26.4* 03/27/2013   MCV 95.9 03/27/2013   PLT 146 03/27/2013      Chemistry      Component Value Date/Time   NA 135* 03/27/2013 0953   NA 137 09/18/2012 1250   K 4.5 03/27/2013 0953   K 4.2 09/18/2012 1250   CL 105 11/20/2012 0939   CL 104 09/18/2012 1250   CO2 25 03/27/2013 0953   CO2 27 09/18/2012 1250   BUN 37.7* 03/27/2013 0953   BUN 34* 09/18/2012 1250   CREATININE 2.5* 03/27/2013 0953   CREATININE 2.06* 09/18/2012 1250      Component Value Date/Time   CALCIUM 11.0* 03/27/2013 0953   CALCIUM 10.2 09/18/2012 1250   ALKPHOS 123 03/27/2013 0953   ALKPHOS 79 09/18/2012 1250   AST 16 03/27/2013 0953   AST 14 09/18/2012 1250   ALT 13 03/27/2013 0953   ALT 12 09/18/2012 1250   BILITOT 0.48 03/27/2013 0953   BILITOT 0.4 09/18/2012 1250        RADIOGRAPHIC STUDIES: No results found.  ASSESSMENT AND PLAN: this is a very pleasant 72 years old [place] American male with extensive stage small cell lung cancer and recent evidence for disease progression. The patient is status post several chemotherapy regimen and currently on treatment with single agent topotecan status post 1 cycle. He continues to have significant fatigue and weakness as well as lack of appetite and poor by mouth intake and dehydration. I have a lengthy discussion with the patient today about his condition. I recommended for the patient to delay his treatment with topotecan until next week. I also discussed with the patient discontinuing the chemotherapy and consideration of palliative care and hospice referral at this point. He would like to think about this option and discuss it with his wife.  I will arrange for the patient to received 1 L of normal saline in the clinic today for hydration. For pain management he would continue his current treatment with oxycodone 10 mg by mouth every 6 hours  as needed. I may consider the patient for long acting pain medication if his pain is not well controlled. He would come back for follow up visit in one week for evaluation and more detailed discussion of his treatment options. The patient voices understanding of current disease status and treatment options and is in agreement with the current care plan.  All questions were answered. The patient knows to call the clinic with any problems, questions or concerns. We can certainly see the patient much sooner if necessary.

## 2013-03-27 NOTE — Telephone Encounter (Signed)
Per staff message I have adjusted 10/22 appts

## 2013-03-27 NOTE — Patient Instructions (Signed)
We will delay cycle #2 of chemotherapy until next week. IV hydration today. Follow up visit in one week

## 2013-03-27 NOTE — Progress Notes (Signed)
Per Dr Arbutus Ped may give 1 liter of normal saline over 2 hours.

## 2013-03-27 NOTE — Telephone Encounter (Signed)
gv and printed appt sched and avs for pt for OCT.....emailed MW to adjust tx. °

## 2013-03-29 ENCOUNTER — Other Ambulatory Visit: Payer: Self-pay

## 2013-03-29 ENCOUNTER — Emergency Department (HOSPITAL_COMMUNITY): Payer: Medicare Other

## 2013-03-29 ENCOUNTER — Telehealth: Payer: Self-pay | Admitting: Medical Oncology

## 2013-03-29 ENCOUNTER — Encounter (HOSPITAL_COMMUNITY): Payer: Self-pay | Admitting: Emergency Medicine

## 2013-03-29 ENCOUNTER — Emergency Department (HOSPITAL_COMMUNITY)
Admission: EM | Admit: 2013-03-29 | Discharge: 2013-03-29 | Disposition: A | Discharge status: Home / Self Care | Payer: Medicare Other | Attending: Emergency Medicine | Admitting: Emergency Medicine

## 2013-03-29 DIAGNOSIS — R000 Tachycardia, unspecified: Secondary | ICD-10-CM | POA: Insufficient documentation

## 2013-03-29 DIAGNOSIS — F17200 Nicotine dependence, unspecified, uncomplicated: Secondary | ICD-10-CM | POA: Insufficient documentation

## 2013-03-29 DIAGNOSIS — R05 Cough: Secondary | ICD-10-CM | POA: Insufficient documentation

## 2013-03-29 DIAGNOSIS — E860 Dehydration: Secondary | ICD-10-CM

## 2013-03-29 DIAGNOSIS — C3490 Malignant neoplasm of unspecified part of unspecified bronchus or lung: Secondary | ICD-10-CM | POA: Insufficient documentation

## 2013-03-29 DIAGNOSIS — I10 Essential (primary) hypertension: Secondary | ICD-10-CM | POA: Insufficient documentation

## 2013-03-29 DIAGNOSIS — R059 Cough, unspecified: Secondary | ICD-10-CM | POA: Insufficient documentation

## 2013-03-29 DIAGNOSIS — Z87448 Personal history of other diseases of urinary system: Secondary | ICD-10-CM | POA: Insufficient documentation

## 2013-03-29 DIAGNOSIS — R630 Anorexia: Secondary | ICD-10-CM | POA: Insufficient documentation

## 2013-03-29 DIAGNOSIS — Z79899 Other long term (current) drug therapy: Secondary | ICD-10-CM | POA: Insufficient documentation

## 2013-03-29 LAB — CBC WITH DIFFERENTIAL/PLATELET
Basophils Absolute: 0 10*3/uL (ref 0.0–0.1)
HCT: 28.2 % — ABNORMAL LOW (ref 39.0–52.0)
Hemoglobin: 9.6 g/dL — ABNORMAL LOW (ref 13.0–17.0)
Lymphocytes Relative: 12 % (ref 12–46)
Monocytes Absolute: 0.6 10*3/uL (ref 0.1–1.0)
Monocytes Relative: 10 % (ref 3–12)
Neutro Abs: 4.1 10*3/uL (ref 1.7–7.7)
Neutrophils Relative %: 77 % (ref 43–77)
RDW: 14.2 % (ref 11.5–15.5)
WBC: 5.4 10*3/uL (ref 4.0–10.5)

## 2013-03-29 LAB — URINALYSIS, ROUTINE W REFLEX MICROSCOPIC
Bilirubin Urine: NEGATIVE
Glucose, UA: NEGATIVE mg/dL
Leukocytes, UA: NEGATIVE
Nitrite: NEGATIVE
Specific Gravity, Urine: 1.023 (ref 1.005–1.030)
pH: 6.5 (ref 5.0–8.0)

## 2013-03-29 LAB — COMPREHENSIVE METABOLIC PANEL
ALT: 13 U/L (ref 0–53)
Alkaline Phosphatase: 124 U/L — ABNORMAL HIGH (ref 39–117)
BUN: 33 mg/dL — ABNORMAL HIGH (ref 6–23)
CO2: 25 mEq/L (ref 19–32)
Calcium: 11.2 mg/dL — ABNORMAL HIGH (ref 8.4–10.5)
GFR calc Af Amer: 34 mL/min — ABNORMAL LOW (ref >90)
GFR calc non Af Amer: 30 mL/min — ABNORMAL LOW (ref >90)
Glucose, Bld: 135 mg/dL — ABNORMAL HIGH (ref 70–99)
Potassium: 4.1 mEq/L (ref 3.5–5.1)
Total Bilirubin: 0.4 mg/dL (ref 0.3–1.2)
Total Protein: 7.9 g/dL (ref 6.0–8.3)

## 2013-03-29 LAB — POCT I-STAT TROPONIN I: Troponin i, poc: 0.02 ng/mL (ref 0.00–0.08)

## 2013-03-29 MED ORDER — SODIUM CHLORIDE 0.9 % IV BOLUS (SEPSIS)
1000.0000 mL | Freq: Once | INTRAVENOUS | Status: AC
  Administered 2013-03-29: 1000 mL via INTRAVENOUS

## 2013-03-29 MED ORDER — THIAMINE HCL 100 MG/ML IJ SOLN
100.0000 mg | Freq: Once | INTRAMUSCULAR | Status: AC
  Administered 2013-03-29: 100 mg via INTRAVENOUS
  Filled 2013-03-29: qty 2

## 2013-03-29 NOTE — Telephone Encounter (Signed)
Being discharged from hospital today . He is concerned that ramal will end up back in ED because he cannot or his wife take care of him . I told [name] to speak to nurses at hospital about his concerns for disposition.

## 2013-03-29 NOTE — ED Provider Notes (Signed)
CSN: [id number]     Arrival date & time 03/29/13  1010 History   None    Chief Complaint  Patient presents with  . Weakness   (Consider location/radiation/quality/duration/timing/severity/associated sxs/prior Treatment) HPI Presents with generalized weakness, worse with standing improved with sitting still or lying for the past one week. He admits to diminished appetite for several days. He denies chest pain abdominal pain fever or urinary symptoms. He reports being treated with intravenous fluids this past week at the cancer center. No other associated symptoms. Last chemotherapy approximately 3 weeks ago.  Past Medical History  Diagnosis Date  . Hypertension   . ALLERGIC RHINITIS   . Malignant neoplasm of bronchus and lung, unspecified site 01/23/2012  . S/P radiation therapy  12/06/2012-12/20/2012    Mediastinum, Right Lung / 30 Gy /10 fractions  . Status post chemotherapy     2 cycles Of Carboplatin/Etoposide - discontinued due to diesease progression: 5 cycles of Cisplatin and Irinotecan: disease progression: 11 cycles of Paclitaxel   . Renal insufficiency    Past Surgical History  Procedure Laterality Date  . Wrist surgery  1961    right wrist  . Orif ankle fracture  05/07/2012    Procedure: OPEN REDUCTION INTERNAL FIXATION (ORIF) ANKLE FRACTURE;  Surgeon: Mable Paris, MD;  Location: [facility];  Service: Orthopedics;  Laterality: Right;   Family History  Problem Relation Age of Onset  . Hypertension Sister   . Hypertension Brother   . Hypertension Mother   . Coronary artery disease Mother    History  Substance Use Topics  . Smoking status: Current Every Day Smoker -- 0.50 packs/day for 50 years    Types: Cigarettes  . Smokeless tobacco: Never Used  . Alcohol Use: No    Review of Systems  Constitutional: Positive for appetite change.  HENT: Negative.   Respiratory: Positive for cough.        Chronic cough productive of yellow or white sputum   Cardiovascular: Negative.   Gastrointestinal: Negative.   Musculoskeletal: Negative.   Skin: Positive for wound.       Painful area on buttocks bilaterally  Neurological: Positive for weakness.  Psychiatric/Behavioral: Negative.   All other systems reviewed and are negative.    Allergies  Review of patient's allergies indicates no known allergies.  Home Medications   Current Outpatient Rx  Name  Route  Sig  Dispense  Refill  . Alum & Mag Hydroxide-Simeth (MAGIC MOUTHWASH W/LIDOCAINE) SOLN      1part nystatin,1part Maaloxplus,1part benadryl,3part 2%viscous lidocaine. Swish/swallow 10 mL up to QID, before meals/bedtime   480 mL   2   . olmesartan-hydrochlorothiazide (BENICAR HCT) 40-12.5 MG per tablet   Oral   Take 0.5 tablets by mouth daily.          Marland Kitchen oxyCODONE 10 MG TABS      One tab po q 6 hours as needed for pain   60 tablet   0   . prochlorperazine (COMPAZINE) 10 MG tablet   Oral   Take 1 tablet (10 mg total) by mouth every 6 (six) hours as needed.   30 tablet   0   . sucralfate (CARAFATE) 1 G tablet      crush 1 tablet in 10 mL H20 and swallow up to QID prn sore throat   30 tablet   3   . vitamin B-12 (CYANOCOBALAMIN) 1000 MCG tablet   Oral   Take 1,000 mcg by mouth daily.  BP 107/62  Temp(Src) 98.4 F (36.9 C) (Oral)  Resp 22  Ht 6\' 2"  (1.88 m)  Wt 220 lb (99.791 kg)  BMI 28.23 kg/m2  SpO2 96% Physical Exam  Nursing note and vitals reviewed. Constitutional: He is oriented to person, place, and time. He appears well-developed and well-nourished.  HENT:  Head: Normocephalic and atraumatic.  Eyes: Conjunctivae are normal. Pupils are equal, round, and reactive to light.  Neck: Neck supple. No tracheal deviation present. No thyromegaly present.  Cardiovascular: Normal rate.   No murmur heard. Mildly tachycardic  Pulmonary/Chest: Effort normal and breath sounds normal.  Abdominal: Soft. Bowel sounds are normal. He exhibits no  distension. There is no tenderness.  Musculoskeletal: Normal range of motion. He exhibits no edema and no tenderness.  Neurological: He is alert and oriented to person, place, and time. Coordination normal.  Gait normal, he becomes mildly lightheaded on standing.  Skin: Skin is warm and dry. No rash noted.  2 dime-sized areas of skin breakdown 1 on each LOC suggestive of stage I pressure ulcer  Psychiatric: He has a normal mood and affect.    ED Course  Procedures (including critical care time) Labs Review Labs Reviewed - No data to display Imaging Review No results found.  EKG Interpretation   None       Date: 03/29/2013  Rate: 115  Rhythm: sinus tachycardia  QRS Axis: left  Intervals: normal  ST/T Wave abnormalities: nonspecific T wave changes  Conduction Disutrbances:Rate is increased since tracing from 05/06/2012 otherwise no significant change interpreted by me  Narrative Interpretation:   Old EKG Reviewed: rate change otherwise no significanttchange  3:50 PM patient feels improved after treatment with intervenous fluids. He feels her to home. Ultrasound-guided thoracentesis was performed in the radiology suite, reportedly 600 mL of bloody fluid was drained from his right pleural cavity. Chest xrays viewed by me Results for orders placed during the hospital encounter of 03/29/13  COMPREHENSIVE METABOLIC PANEL      Result Value Range   Sodium 132 (*) 135 - 145 mEq/L   Potassium 4.1  3.5 - 5.1 mEq/L   Chloride 97  96 - 112 mEq/L   CO2 25  19 - 32 mEq/L   Glucose, Bld 135 (*) 70 - 99 mg/dL   BUN 33 (*) 6 - 23 mg/dL   Creatinine, Ser 1.61 (*) 0.50 - 1.35 mg/dL   Calcium 09.6 (*) 8.4 - 10.5 mg/dL   Total Protein 7.9  6.0 - 8.3 g/dL   Albumin 2.9 (*) 3.5 - 5.2 g/dL   AST 20  0 - 37 U/L   ALT 13  0 - 53 U/L   Alkaline Phosphatase 124 (*) 39 - 117 U/L   Total Bilirubin 0.4  0.3 - 1.2 mg/dL   GFR calc non Af Amer 30 (*) >90 mL/min   GFR calc Af Amer 34 (*) >90 mL/min   CBC WITH DIFFERENTIAL      Result Value Range   WBC 5.4  4.0 - 10.5 K/uL   RBC 2.97 (*) 4.22 - 5.81 MIL/uL   Hemoglobin 9.6 (*) 13.0 - 17.0 g/dL   HCT 04.5 (*) 40.9 - 81.1 %   MCV 94.9  78.0 - 100.0 fL   MCH 32.3  26.0 - 34.0 pg   MCHC 34.0  30.0 - 36.0 g/dL   RDW 91.4  78.2 - 95.6 %   Platelets 208  150 - 400 K/uL   Neutrophils Relative % 77  43 -  77 %   Neutro Abs 4.1  1.7 - 7.7 K/uL   Lymphocytes Relative 12  12 - 46 %   Lymphs Abs 0.7  0.7 - 4.0 K/uL   Monocytes Relative 10  3 - 12 %   Monocytes Absolute 0.6  0.1 - 1.0 K/uL   Eosinophils Relative 1  0 - 5 %   Eosinophils Absolute 0.0  0.0 - 0.7 K/uL   Basophils Relative 0  0 - 1 %   Basophils Absolute 0.0  0.0 - 0.1 K/uL  URINALYSIS, ROUTINE W REFLEX MICROSCOPIC      Result Value Range   Color, Urine YELLOW  YELLOW   APPearance CLEAR  CLEAR   Specific Gravity, Urine 1.023  1.005 - 1.030   pH 6.5  5.0 - 8.0   Glucose, UA NEGATIVE  NEGATIVE mg/dL   Hgb urine dipstick NEGATIVE  NEGATIVE   Bilirubin Urine NEGATIVE  NEGATIVE   Ketones, ur NEGATIVE  NEGATIVE mg/dL   Protein, ur NEGATIVE  NEGATIVE mg/dL   Urobilinogen, UA 1.0  0.0 - 1.0 mg/dL   Nitrite NEGATIVE  NEGATIVE   Leukocytes, UA NEGATIVE  NEGATIVE  POCT I-STAT TROPONIN I      Result Value Range   Troponin i, poc 0.02  0.00 - 0.08 ng/mL   Comment 3            Dg Chest 1 View  03/29/2013   CLINICAL DATA:  POST THORACENTESIS.  EXAM: CHEST - 1 VIEW  COMPARISON:  03/29/2013 11:13 A.M.  FINDINGS: EXAM LIMITED BY TECHNIQUE AND PATIENT'S HABITUS. ARTIFACT PROJECTS OVER THE LATERAL ASPECT OF THE RIGHT CHEST WITHOUT GROSS PNEUMOTHORAX NOTED.  RIGHT-SIDED PLEURAL EFFUSION REMAINS. CT DETECTED MALIGNANCY RIGHT LUNG MALIGNANCY ADEQUATELY DELINEATED ON THE PRESENT EXAM.  PULMONARY VASCULAR CONGESTION.  HEART APPEARS PROMINENT.  IMPRESSION: EXAM LIMITED BY TECHNIQUE AND PATIENT'S HABITUS. ARTIFACT PROJECTS OVER THE LATERAL ASPECT OF THE RIGHT CHEST WITHOUT GROSS PNEUMOTHORAX NOTED.   PLEASE SEE ABOVE.   Electronically Signed   By: Bridgett Larsson M.D.   On: 03/29/2013 15:07   Dg Chest 2 View  03/29/2013   CLINICAL DATA:  Cough, shortness of breath.  EXAM: CHEST  2 VIEW  COMPARISON:  May 05, 2012.  FINDINGS: There is nearly complete opacification of the right hemothorax concerning for large pleural effusion with probable associated pneumonia or atelectasis. Some mediastinal shift to the left is noted. Left lung is clear. Bony thorax is intact. No pneumothorax is seen.  IMPRESSION: Interval development of large right pleural effusion with associated pneumonia or atelectasis. This results in mild mediastinal shift to the left.   Electronically Signed   By: Roque Lias M.D.   On: 03/29/2013 11:59   US Thoracentesis Asp Pleural Space W/img Guide  03/29/2013   CLINICAL DATA:  History of extensive small cell lung cancer. History of radiation therapy to right chest. Right-sided pleural effusion with acute shortness of breath. Request therapeutic thoracentesis.  EXAM: ULTRASOUND GUIDED right THORACENTESIS  COMPARISON:  None.  FINDINGS: A total of approximately 600 mL of dark bloody pleural fluid was removed. A fluid sample was notsent for laboratory analysis.  IMPRESSION: Successful ultrasound guided right thoracentesis yielding 600 mL of pleural fluid.  Read by: Brayton El PA-C  PROCEDURE: An ultrasound guided thoracentesis was thoroughly discussed with the patient and questions answered. The benefits, risks, alternatives and complications were also discussed. The patient understands and wishes to proceed with the procedure. Written consent was obtained.  Ultrasound was performed  to localize and mark an adequate pocket of fluid in the right chest. The area was then prepped and draped in the normal sterile fashion. 1% Lidocaine was used for local anesthesia. Under ultrasound guidance a 19 gauge Yueh catheter was introduced. Thoracentesis was performed. The catheter was removed and a  dressing applied.  Complications:  None immediate   Electronically Signed   By: Richarda Overlie M.D.   On: 03/29/2013 14:51    MDM  No diagnosis found. Patient noted to be mildly tachycardic on discharge. He reports feeling mildly winded but states that he is feeling at his baseline. He is encouraged to drink lots of fluids. Follow up with Dr.Mohammad, he has a scheduled appointment 5 days. Diagnosis #1 dehydration #2 anemia #3  Right pleural effusion  #4  Chronic renal insufficiency #5 hyperglycemia   Doug Sou, MD 03/29/13 [phone]

## 2013-03-29 NOTE — ED Notes (Signed)
Pt pulled out own IV.

## 2013-03-29 NOTE — ED Notes (Signed)
Patient transported to Ultrasound 

## 2013-03-29 NOTE — ED Notes (Signed)
Pt states he has not eaten anything since Wednesday "I just don't have an appetite"

## 2013-03-29 NOTE — Procedures (Signed)
Successful US guided right thoracentesis. Yielded of dark bloody pleural fluid. Pt tolerated procedure well. No immediate complications.  Specimen was not sent for labs. CXR ordered.  Brayton El PA-C 03/29/2013 2:37 PM

## 2013-03-29 NOTE — ED Notes (Signed)
Patient transported back to room from Ultrasound 

## 2013-03-29 NOTE — ED Notes (Signed)
Per pt has been weak since Wednesday. Pt reports poor appetite. Pt denies SOB. Pt has has hx of lung cancer.

## 2013-03-29 NOTE — ED Notes (Signed)
MD at bedside. Dr. Lavona Mound at bedside.

## 2013-04-03 ENCOUNTER — Other Ambulatory Visit: Payer: Medicare Other | Admitting: Lab

## 2013-04-03 ENCOUNTER — Ambulatory Visit: Payer: Medicare Other | Admitting: Physician Assistant

## 2013-04-03 ENCOUNTER — Ambulatory Visit: Payer: Medicare Other

## 2013-04-04 ENCOUNTER — Encounter (HOSPITAL_COMMUNITY): Payer: Self-pay | Admitting: Emergency Medicine

## 2013-04-04 ENCOUNTER — Inpatient Hospital Stay (HOSPITAL_COMMUNITY)
Admission: EM | Admit: 2013-04-04 | Discharge: 2013-04-13 | DRG: 291 | Disposition: E | Payer: Medicare Other | Attending: Internal Medicine | Admitting: Internal Medicine

## 2013-04-04 ENCOUNTER — Emergency Department (HOSPITAL_COMMUNITY): Payer: Medicare Other

## 2013-04-04 DIAGNOSIS — E860 Dehydration: Secondary | ICD-10-CM | POA: Diagnosis present

## 2013-04-04 DIAGNOSIS — F17200 Nicotine dependence, unspecified, uncomplicated: Secondary | ICD-10-CM | POA: Diagnosis present

## 2013-04-04 DIAGNOSIS — Z923 Personal history of irradiation: Secondary | ICD-10-CM

## 2013-04-04 DIAGNOSIS — Z79899 Other long term (current) drug therapy: Secondary | ICD-10-CM

## 2013-04-04 DIAGNOSIS — Z66 Do not resuscitate: Secondary | ICD-10-CM | POA: Diagnosis present

## 2013-04-04 DIAGNOSIS — R0600 Dyspnea, unspecified: Secondary | ICD-10-CM

## 2013-04-04 DIAGNOSIS — Z9221 Personal history of antineoplastic chemotherapy: Secondary | ICD-10-CM

## 2013-04-04 DIAGNOSIS — R451 Restlessness and agitation: Secondary | ICD-10-CM | POA: Diagnosis present

## 2013-04-04 DIAGNOSIS — R748 Abnormal levels of other serum enzymes: Secondary | ICD-10-CM | POA: Diagnosis present

## 2013-04-04 DIAGNOSIS — D649 Anemia, unspecified: Secondary | ICD-10-CM | POA: Diagnosis present

## 2013-04-04 DIAGNOSIS — N183 Chronic kidney disease, stage 3 unspecified: Secondary | ICD-10-CM | POA: Diagnosis present

## 2013-04-04 DIAGNOSIS — N179 Acute kidney failure, unspecified: Secondary | ICD-10-CM | POA: Diagnosis present

## 2013-04-04 DIAGNOSIS — C3490 Malignant neoplasm of unspecified part of unspecified bronchus or lung: Secondary | ICD-10-CM | POA: Diagnosis present

## 2013-04-04 DIAGNOSIS — I509 Heart failure, unspecified: Secondary | ICD-10-CM | POA: Diagnosis present

## 2013-04-04 DIAGNOSIS — R0902 Hypoxemia: Secondary | ICD-10-CM | POA: Diagnosis present

## 2013-04-04 DIAGNOSIS — I129 Hypertensive chronic kidney disease with stage 1 through stage 4 chronic kidney disease, or unspecified chronic kidney disease: Secondary | ICD-10-CM | POA: Diagnosis present

## 2013-04-04 DIAGNOSIS — I5033 Acute on chronic diastolic (congestive) heart failure: Principal | ICD-10-CM | POA: Diagnosis present

## 2013-04-04 DIAGNOSIS — C3430 Malignant neoplasm of lower lobe, unspecified bronchus or lung: Secondary | ICD-10-CM

## 2013-04-04 DIAGNOSIS — R7989 Other specified abnormal findings of blood chemistry: Secondary | ICD-10-CM

## 2013-04-04 DIAGNOSIS — I10 Essential (primary) hypertension: Secondary | ICD-10-CM

## 2013-04-04 DIAGNOSIS — I498 Other specified cardiac arrhythmias: Secondary | ICD-10-CM | POA: Diagnosis present

## 2013-04-04 DIAGNOSIS — J810 Acute pulmonary edema: Secondary | ICD-10-CM | POA: Diagnosis present

## 2013-04-04 DIAGNOSIS — J9600 Acute respiratory failure, unspecified whether with hypoxia or hypercapnia: Secondary | ICD-10-CM | POA: Diagnosis present

## 2013-04-04 DIAGNOSIS — D72829 Elevated white blood cell count, unspecified: Secondary | ICD-10-CM | POA: Diagnosis present

## 2013-04-04 DIAGNOSIS — Z515 Encounter for palliative care: Secondary | ICD-10-CM

## 2013-04-04 DIAGNOSIS — R159 Full incontinence of feces: Secondary | ICD-10-CM | POA: Diagnosis not present

## 2013-04-04 DIAGNOSIS — I359 Nonrheumatic aortic valve disorder, unspecified: Secondary | ICD-10-CM | POA: Diagnosis present

## 2013-04-04 LAB — COMPREHENSIVE METABOLIC PANEL
ALT: 15 U/L (ref 0–53)
Albumin: 2.6 g/dL — ABNORMAL LOW (ref 3.5–5.2)
Alkaline Phosphatase: 174 U/L — ABNORMAL HIGH (ref 39–117)
CO2: 21 mEq/L (ref 19–32)
Calcium: 11.7 mg/dL — ABNORMAL HIGH (ref 8.4–10.5)
Creatinine, Ser: 3.05 mg/dL — ABNORMAL HIGH (ref 0.50–1.35)
GFR calc Af Amer: 22 mL/min — ABNORMAL LOW (ref >90)
GFR calc non Af Amer: 19 mL/min — ABNORMAL LOW (ref >90)
Glucose, Bld: 164 mg/dL — ABNORMAL HIGH (ref 70–99)
Potassium: 4.4 mEq/L (ref 3.5–5.1)
Sodium: 138 mEq/L (ref 135–145)
Total Bilirubin: 0.5 mg/dL (ref 0.3–1.2)
Total Protein: 8 g/dL (ref 6.0–8.3)

## 2013-04-04 LAB — BLOOD GAS, ARTERIAL
Acid-base deficit: 1.1 mmol/L (ref 0.0–2.0)
Drawn by: 308601
FIO2: 1 %
O2 Saturation: 82.8 %
TCO2: 19.7 mmol/L (ref 0–100)
pCO2 arterial: 30 mmHg — ABNORMAL LOW (ref 35.0–45.0)
pH, Arterial: 7.469 — ABNORMAL HIGH (ref 7.350–7.450)
pO2, Arterial: 49.4 mmHg — ABNORMAL LOW (ref 80.0–100.0)

## 2013-04-04 LAB — CBC WITH DIFFERENTIAL/PLATELET
Basophils Relative: 0 % (ref 0–1)
Eosinophils Absolute: 0 10*3/uL (ref 0.0–0.7)
Eosinophils Relative: 0 % (ref 0–5)
Lymphocytes Relative: 5 % — ABNORMAL LOW (ref 12–46)
MCH: 32.4 pg (ref 26.0–34.0)
MCHC: 33.8 g/dL (ref 30.0–36.0)
MCV: 95.8 fL (ref 78.0–100.0)
Neutrophils Relative %: 84 % — ABNORMAL HIGH (ref 43–77)
Platelets: 302 10*3/uL (ref 150–400)
RDW: 15.8 % — ABNORMAL HIGH (ref 11.5–15.5)

## 2013-04-04 LAB — CG4 I-STAT (LACTIC ACID): Lactic Acid, Venous: 5.38 mmol/L — ABNORMAL HIGH (ref 0.5–2.2)

## 2013-04-04 LAB — PRO B NATRIURETIC PEPTIDE: Pro B Natriuretic peptide (BNP): 2641 pg/mL — ABNORMAL HIGH (ref 0–125)

## 2013-04-04 LAB — TROPONIN I: Troponin I: <0.3 ng/mL (ref <0.30)

## 2013-04-04 MED ORDER — FUROSEMIDE 10 MG/ML IJ SOLN
100.0000 mg | Freq: Once | INTRAMUSCULAR | Status: AC
  Administered 2013-04-04: 100 mg via INTRAVENOUS
  Filled 2013-04-04: qty 10

## 2013-04-04 MED ORDER — SODIUM CHLORIDE 0.9 % IV SOLN
INTRAVENOUS | Status: DC
  Administered 2013-04-04: 21:00:00 via INTRAVENOUS

## 2013-04-04 MED ORDER — MORPHINE SULFATE 2 MG/ML IJ SOLN
2.0000 mg | INTRAMUSCULAR | Status: DC | PRN
  Administered 2013-04-04 – 2013-04-05 (×3): 4 mg via INTRAVENOUS
  Filled 2013-04-04 (×3): qty 2

## 2013-04-04 NOTE — H&P (Signed)
[facility] History and Physical  Nai Dasch WGN:[id number] DOB: December 29, 1940 DOA: 04-25-13  Referring physician: ER physician. PCP: [name], MD  Specialists: Dr. Arbutus Ped. Oncologist.  Chief Complaint: Shortness of breath.  HPI: [name] is a 72 y.o. male with known history of extensive stage small cell lung cancer presented with increasing shortness of breath which acutely worsened today. Patient has been having shortness of breath for last few days and also has been feeling weak and fatigued. Last week patient did get IV fluids for dehydration. Patient denies any chest pain or productive cough fever chills. In the ER patient was found to be in acute hypoxic respiratory failure and was placed on BiPAP. Critical care was consulted but at this time after discussion with patient and patient's son the goals of care this is to make patient comfortable. No aggressive measures and for now continue with BiPAP and IV Lasix and morphine has been added. No further labs will be ordered. Patient has been having increasing pain particularly low back and also had bowel incontinence is today.  Recent notes from Dr. Arbutus Ped patient's oncologist patient was advised about hospice care.  Review of Systems: As presented in the history of presenting illness, rest negative.  Past Medical History  Diagnosis Date  . Hypertension   . ALLERGIC RHINITIS   . Malignant neoplasm of bronchus and lung, unspecified site 01/23/2012  . S/P radiation therapy  12/06/2012-12/20/2012    Mediastinum, Right Lung / 30 Gy /10 fractions  . Status post chemotherapy     2 cycles Of Carboplatin/Etoposide - discontinued due to diesease progression: 5 cycles of Cisplatin and Irinotecan: disease progression: 11 cycles of Paclitaxel   . Renal insufficiency    Past Surgical History  Procedure Laterality Date  . Wrist surgery  1961    right wrist  . Orif ankle fracture  05/07/2012    Procedure: OPEN REDUCTION  INTERNAL FIXATION (ORIF) ANKLE FRACTURE;  Surgeon: Mable Paris, MD;  Location: [facility];  Service: Orthopedics;  Laterality: Right;   Social History:  reports that he has been smoking Cigarettes.  He has a 25 pack-year smoking history. He has never used smokeless tobacco. He reports that he does not drink alcohol or use illicit drugs. Where does patient live home. Can patient participate in ADLs? No.  No Known Allergies  Family History:  Family History  Problem Relation Age of Onset  . Hypertension Sister   . Hypertension Brother   . Hypertension Mother   . Coronary artery disease Mother       Prior to Admission medications   Medication Sig Start Date End Date Taking? Authorizing Provider  Acetaminophen-Codeine (TYLENOL/CODEINE #3) 300-30 MG per tablet Take 1-2 tablets by mouth every 4 (four) hours as needed for pain.   Yes Historical Provider, MD  methylPREDNISolone (MEDROL DOSEPAK) 4 MG tablet Take 4 mg by mouth See admin instructions. follow package directions   Yes Historical Provider, MD  olmesartan-hydrochlorothiazide (BENICAR HCT) 40-12.5 MG per tablet Take 0.5 tablets by mouth daily.    Yes Historical Provider, MD  Oxycodone HCl 10 MG TABS Take 10 mg by mouth every 6 (six) hours as needed (for pain).   Yes Historical Provider, MD  prochlorperazine (COMPAZINE) 10 MG tablet Take 1 tablet (10 mg total) by mouth every 6 (six) hours as needed. 02/27/13  Yes Si Gaul, MD  sucralfate (CARAFATE) 1 G tablet crush 1 tablet in 10 mL H20 and swallow up to QID prn sore throat 12/17/12  Yes Lonie Peak, MD  vitamin B-12 (CYANOCOBALAMIN) 1000 MCG tablet Take 1,000 mcg by mouth daily.   Yes Historical Provider, MD    Physical Exam: Filed Vitals:   04/22/13 2115 April 22, 2013 2309  BP: 97/57   Pulse: 126 132  Resp: 34 29  SpO2: 100% 100%     General:  Well-developed and nourished.  Eyes: Anicteric no pallor.  ENT:  No discharge from the ears eyes nose mouth.  Neck: No  mass felt.  Cardiovascular: S1-S2 heard.  Respiratory: No rhonchi, bilateral crepitations heard.  Abdomen: Soft nontender.  Skin: No rash.  Musculoskeletal: No edema.  Psychiatric: Appears normal.  Neurologic: Alert awake oriented to time place and person. Moves all extremities.  Labs on Admission:  Basic Metabolic Panel:  Recent Labs Lab 03/29/13 1156 Apr 22, 2013 2055  NA 132* 138  K 4.1 4.4  CL 97 98  CO2 25 21  GLUCOSE 135* 164*  BUN 33* 51*  CREATININE 2.11* 3.05*  CALCIUM 11.2* 11.7*   Liver Function Tests:  Recent Labs Lab 03/29/13 1156 04/22/2013 2055  AST 20 39*  ALT 13 15  ALKPHOS 124* 174*  BILITOT 0.4 0.5  PROT 7.9 8.0  ALBUMIN 2.9* 2.6*   No results found for this basename: LIPASE, AMYLASE,  in the last 168 hours No results found for this basename: AMMONIA,  in the last 168 hours CBC:  Recent Labs Lab 03/29/13 1156 April 22, 2013 2055  WBC 5.4 15.0*  NEUTROABS 4.1 12.5*  HGB 9.6* 10.1*  HCT 28.2* 29.9*  MCV 94.9 95.8  PLT 208 302   Cardiac Enzymes:  Recent Labs Lab 04-22-2013 2055  TROPONINI <0.30    BNP (last 3 results)  Recent Labs  04/22/2013 2055  PROBNP 2641.0*   CBG: No results found for this basename: GLUCAP,  in the last 168 hours  Radiological Exams on Admission: Dg Chest Port 1 View  04/22/13   CLINICAL DATA:  Chest pain, smoker  EXAM: PORTABLE CHEST - 1 VIEW  COMPARISON:  03/29/2013  FINDINGS: Heart is enlarged with diffuse edema pattern throughout both lungs. Moderate right effusion extending along the right lateral chest superiorly. Trachea is midline. Atherosclerosis of the aorta. Mediastinal adenopathy and right lower lobe central mass not well appreciated by plain radiography. Please refer to the prior CT 02/22/2013. Monitor leads overlie the chest.  IMPRESSION: Diffuse edema pattern with a right effusion. Pattern consistent with CHF.   Electronically Signed   By: Ruel Favors M.D.   On: 04/22/2013 21:21      Assessment/Plan Principal Problem:   Acute respiratory failure Active Problems:   Small cell lung carcinoma   HTN (hypertension)   CKD (chronic kidney disease) stage 3, GFR 30-59 ml/min   1. Acute hypoxic respiratory failure from acute pulmonary edema from CHF. Last EF in 2013 was 60% but showed moderate aortic stenosis. 2. Extensive stage small cell lung cancer. 3. History of moderate aortic stenosis per 2-D echo in 2013. 4. Acute on chronic renal failure. 5. History of hypertension.  Plan: At this time patient and patient's son has opted for comfort measures. Patient will be continued on BiPAP IV Lasix and IV morphine. No further labs ordered as per the discussion with the family and patient. Consult hospice in a.m.    Code Status: DO NOT RESUSCITATE.  Family Communication: Patient's son at the bedside.  Disposition Plan: Admit to inpatient.    Ashlyn Cabler N. [facility] Pager [phone].  If 7PM-7AM, please contact night-coverage [email or web address] Password [email or web address]  2013-04-17, 11:34 PM

## 2013-04-04 NOTE — ED Notes (Signed)
Per EMS called out for shortness of breath  Pt had exp wheezing was given albuterol 5mg  and placed on oxygen during transport pt's blood pressure began to drop and his sats dropped  Upon arrival unable to obtain a B/P or pulse ox  Pt is on NRBM at 100%  EMS unable to obtain IV after multiple attempts   Pt has lung cancer

## 2013-04-04 NOTE — Consult Note (Signed)
PULMONARY  / CRITICAL CARE MEDICINE  Name: [name] MRN: [id number] DOB: Oct 17, 1940     ADMISSION DATE:  03/30/2013 CONSULTATION DATE:  03/15/2013   REFERRING MD :  Freida Busman PRIMARY SERVICE: ED  CHIEF COMPLAINT:  Shortness of breath  BRIEF PATIENT DESCRIPTION: 72 y/o male with advanced stage small cell lung cancer refractory to therapy presented to the [facility] ED on 10/23 in respiratory failure requiring BIPAP.  SIGNIFICANT EVENTS / STUDIES:    LINES / TUBES: 10/23 bipap  CULTURES: 10/23 blood >  ANTIBIOTICS:   HISTORY OF PRESENT ILLNESS:  72 y/o male with advanced stage small cell lung cancer refractory to therapy presented to the [facility] ED on 10/23 in respiratory failure requiring BIPAP.  His family states that the last week has been rough in that he has had worsening dyspnea, weakness, confusion and falls.  He has had accidents with going to the bathroom and has not been eating much.  He was brought to the ED on 10/23 in respiratory distress.  He had a thoracentesis on 10/17.  He is markedly confused and cannot provide a history.  PAST MEDICAL HISTORY :  Past Medical History  Diagnosis Date  . Hypertension   . ALLERGIC RHINITIS   . Malignant neoplasm of bronchus and lung, unspecified site 01/23/2012  . S/P radiation therapy  12/06/2012-12/20/2012    Mediastinum, Right Lung / 30 Gy /10 fractions  . Status post chemotherapy     2 cycles Of Carboplatin/Etoposide - discontinued due to diesease progression: 5 cycles of Cisplatin and Irinotecan: disease progression: 11 cycles of Paclitaxel   . Renal insufficiency    Past Surgical History  Procedure Laterality Date  . Wrist surgery  1961    right wrist  . Orif ankle fracture  05/07/2012    Procedure: OPEN REDUCTION INTERNAL FIXATION (ORIF) ANKLE FRACTURE;  Surgeon: Mable Paris, MD;  Location: [facility]S;  Service: Orthopedics;  Laterality: Right;   Prior to Admission medications   Medication Sig Start Date End Date Taking?  Authorizing Provider  Acetaminophen-Codeine (TYLENOL/CODEINE #3) 300-30 MG per tablet Take 1-2 tablets by mouth every 4 (four) hours as needed for pain.   Yes Historical Provider, MD  methylPREDNISolone (MEDROL DOSEPAK) 4 MG tablet Take 4 mg by mouth See admin instructions. follow package directions   Yes Historical Provider, MD  olmesartan-hydrochlorothiazide (BENICAR HCT) 40-12.5 MG per tablet Take 0.5 tablets by mouth daily.    Yes Historical Provider, MD  Oxycodone HCl 10 MG TABS Take 10 mg by mouth every 6 (six) hours as needed (for pain).   Yes Historical Provider, MD  prochlorperazine (COMPAZINE) 10 MG tablet Take 1 tablet (10 mg total) by mouth every 6 (six) hours as needed. 02/27/13  Yes Si Gaul, MD  sucralfate (CARAFATE) 1 G tablet crush 1 tablet in 10 mL H20 and swallow up to QID prn sore throat 12/17/12  Yes Lonie Peak, MD  vitamin B-12 (CYANOCOBALAMIN) 1000 MCG tablet Take 1,000 mcg by mouth daily.   Yes Historical Provider, MD   No Known Allergies  FAMILY HISTORY:  Family History  Problem Relation Age of Onset  . Hypertension Sister   . Hypertension Brother   . Hypertension Mother   . Coronary artery disease Mother    SOCIAL HISTORY:  reports that he has been smoking Cigarettes.  He has a 25 pack-year smoking history. He has never used smokeless tobacco. He reports that he does not drink alcohol or use illicit drugs.  REVIEW OF SYSTEMS:  Cannot obtain due to confusion/bipap  SUBJECTIVE:   VITAL SIGNS: Pulse Rate:  [126] 126 (10/23 2115) Resp:  [34] 34 (10/23 2115) BP: (97)/(57) 97/57 mmHg (10/23 2115) SpO2:  [100 %] 100 % (10/23 2115) HEMODYNAMICS:   VENTILATOR SETTINGS:   INTAKE / OUTPUT: Intake/Output   None     PHYSICAL EXAMINATION:  Gen: agitated on BIPAP HEENT: NCAT, PERRL PULM: diminished R base, some crackles bilaterally CV: Tachy, regular AB: diminished bowel sounds, nontender Ext: cool, no edema Neuro: confused, pulling at tubes and  lines  LABS:  CBC Recent Labs     04-28-2013  2055  WBC  15.0*  HGB  10.1*  HCT  29.9*  PLT  302   Coag's No results found for this basename: APTT, INR,  in the last 72 hours BMET Recent Labs     04/28/13  2055  NA  138  K  4.4  CL  98  CO2  21  BUN  51*  CREATININE  3.05*  GLUCOSE  164*   Electrolytes Recent Labs     28-Apr-2013  2055  CALCIUM  11.7*   Sepsis Markers No results found for this basename: LACTICACIDVEN, PROCALCITON, O2SATVEN,  in the last 72 hours ABG Recent Labs     04/28/2013  2055  PHART  7.469*  PCO2ART  30.0*  PO2ART  49.4*   Liver Enzymes Recent Labs     04-28-13  2055  AST  39*  ALT  15  ALKPHOS  174*  BILITOT  0.5  ALBUMIN  2.6*   Cardiac Enzymes Recent Labs     04-28-2013  2055  TROPONINI  <0.30  PROBNP  2641.0*   Glucose No results found for this basename: GLUCAP,  in the last 72 hours  Imaging Dg Chest Port 1 View  2013/04/28   CLINICAL DATA:  Chest pain, smoker  EXAM: PORTABLE CHEST - 1 VIEW  COMPARISON:  03/29/2013  FINDINGS: Heart is enlarged with diffuse edema pattern throughout both lungs. Moderate right effusion extending along the right lateral chest superiorly. Trachea is midline. Atherosclerosis of the aorta. Mediastinal adenopathy and right lower lobe central mass not well appreciated by plain radiography. Please refer to the prior CT 02/22/2013. Monitor leads overlie the chest.  IMPRESSION: Diffuse edema pattern with a right effusion. Pattern consistent with CHF.   Electronically Signed   By: Ruel Favors M.D.   On: 2013-04-28 21:21     CXR: R pleural effusion, bilateral airspace disease  Impression 1) acute hypoxemic respiratory failure 2) Pulmonary edema 3) advanced stage small cell lung cancer 4) possible HCAP 5) Pleural effusion  ASSESSMENT / PLAN:  This is a very unfortunate gentleman with advanced stage small cell lung cancer refractory to therapy who is currently dying from hypoxemic respiratory  failure.  The respiratory failure is likely due to pulmonary edema and the effusion, but HCAP is also possible.  Regardless the cause, he is actively dying and has a disease that man cannot cure.  I have discussed the situation with his step son and brother and they both have asked that we provide comfort measures only.    -morphine now for comfort -agree with diuresis -BIPAP prn -consider antibiotics, but unlikely they will help -admit to stepdown vs palliative floor per [facility]  Family: Demitrius Stockton (step son) and Clayten Allcock (brother, phone [phone]) updated by me at length and agree with the plan of care.  CODE: DNR, COMFORT MEASURES ONLY  I have personally  obtained a history, examined the patient, evaluated laboratory and imaging results, formulated the assessment and plan and placed orders. CRITICAL CARE: The patient is critically ill with multiple organ systems failure and requires high complexity decision making for assessment and support, frequent evaluation and titration of therapies, application of advanced monitoring technologies and extensive interpretation of multiple databases. Critical Care Time devoted to patient care services described in this note is 45 minutes.   Fonnie Jarvis Pulmonary and Critical Care Medicine [facility] Pager: [phone]  03/15/2013, 10:54 PM

## 2013-04-04 NOTE — ED Notes (Signed)
Bed: RESB Expected date: 03/18/2013 Expected time: 8:11 PM Means of arrival: Ambulance Comments: 72 yo M lung cancer pt

## 2013-04-04 NOTE — ED Provider Notes (Addendum)
CSN: [id number]     Arrival date & time 03/25/2013  2040 History   First MD Initiated Contact with Patient 03/28/2013 2041     Chief Complaint  Patient presents with  . Shortness of Breath   (Consider location/radiation/quality/duration/timing/severity/associated sxs/prior Treatment) Patient is a 72 y.o. male presenting with shortness of breath. The history is provided by the patient and the EMS personnel. The history is limited by the condition of the patient.  Shortness of Breath  patient here complaining of shortness of breath x2 weeks that is worse today. Called EMS and patient was found to be hypoxic with wheezing. Was given oxygen as well as albuterol with slight improvement. Patient does have a history of lung cancer and last chemotherapy treatment was 2 weeks ago. He does note a nonproductive cough. Denies any chest pain. No black or bloody stools. Symptoms have been gradually getting worse. No further history obtainable secondary to the patient's condition.  Past Medical History  Diagnosis Date  . Hypertension   . ALLERGIC RHINITIS   . Malignant neoplasm of bronchus and lung, unspecified site 01/23/2012  . S/P radiation therapy  12/06/2012-12/20/2012    Mediastinum, Right Lung / 30 Gy /10 fractions  . Status post chemotherapy     2 cycles Of Carboplatin/Etoposide - discontinued due to diesease progression: 5 cycles of Cisplatin and Irinotecan: disease progression: 11 cycles of Paclitaxel   . Renal insufficiency    Past Surgical History  Procedure Laterality Date  . Wrist surgery  1961    right wrist  . Orif ankle fracture  05/07/2012    Procedure: OPEN REDUCTION INTERNAL FIXATION (ORIF) ANKLE FRACTURE;  Surgeon: Mable Paris, MD;  Location: [facility];  Service: Orthopedics;  Laterality: Right;   Family History  Problem Relation Age of Onset  . Hypertension Sister   . Hypertension Brother   . Hypertension Mother   . Coronary artery disease Mother    History  Substance  Use Topics  . Smoking status: Current Every Day Smoker -- 0.50 packs/day for 50 years    Types: Cigarettes  . Smokeless tobacco: Never Used  . Alcohol Use: No    Review of Systems  Unable to perform ROS Respiratory: Positive for shortness of breath.     Allergies  Review of patient's allergies indicates no known allergies.  Home Medications   Current Outpatient Rx  Name  Route  Sig  Dispense  Refill  . olmesartan-hydrochlorothiazide (BENICAR HCT) 40-12.5 MG per tablet   Oral   Take 0.5 tablets by mouth daily.          Marland Kitchen oxyCODONE 10 MG TABS      One tab po q 6 hours as needed for pain   60 tablet   0   . prochlorperazine (COMPAZINE) 10 MG tablet   Oral   Take 1 tablet (10 mg total) by mouth every 6 (six) hours as needed.   30 tablet   0   . sucralfate (CARAFATE) 1 G tablet      crush 1 tablet in 10 mL H20 and swallow up to QID prn sore throat   30 tablet   3   . vitamin B-12 (CYANOCOBALAMIN) 1000 MCG tablet   Oral   Take 1,000 mcg by mouth daily.          There were no vitals taken for this visit. Physical Exam  Nursing note and vitals reviewed. Constitutional: He is oriented to person, place, and time. He appears lethargic. He  appears cachectic. He is cooperative. He appears toxic. He has a sickly appearance. He appears ill. He appears distressed.  HENT:  Head: Normocephalic and atraumatic.  Eyes: Conjunctivae, EOM and lids are normal. Pupils are equal, round, and reactive to light.  Neck: Normal range of motion. Neck supple. No tracheal deviation present. No mass present.  Cardiovascular: Regular rhythm and normal heart sounds.  Tachycardia present.  Exam reveals no gallop.   No murmur heard. Pulmonary/Chest: Effort normal. No stridor. No respiratory distress. He has decreased breath sounds. He has wheezes. He has no rhonchi. He has no rales.  Abdominal: Soft. Normal appearance and bowel sounds are normal. He exhibits no distension. There is no  tenderness. There is no rebound and no CVA tenderness.  Musculoskeletal: Normal range of motion. He exhibits no edema and no tenderness.  Neurological: He is oriented to person, place, and time. He has normal strength. He appears lethargic. No cranial nerve deficit or sensory deficit. GCS eye subscore is 4. GCS verbal subscore is 5. GCS motor subscore is 6.  Skin: Skin is warm and dry. No abrasion and no rash noted.  Psychiatric: His affect is blunt. His speech is delayed. He is slowed.    ED Course  Procedures (including critical care time) Labs Review Labs Reviewed  CULTURE, BLOOD (ROUTINE X 2)  CULTURE, BLOOD (ROUTINE X 2)  PRO B NATRIURETIC PEPTIDE  TROPONIN I  CBC WITH DIFFERENTIAL  COMPREHENSIVE METABOLIC PANEL  BLOOD GAS, ARTERIAL   Imaging Review No results found.  EKG Interpretation   None       MDM  No diagnosis found.  Date: 03/19/2013  Rate: 128  Rhythm: sinus tachycardia  QRS Axis: normal  Intervals: normal  ST/T Wave abnormalities: nonspecific ST changes  Conduction Disutrbances:none  Narrative Interpretation:   Old EKG Reviewed: changes noted, rate increased  9:35 PM Patient placed on BiPAP for his hypoxemia. He is mentating appropriately. Leukocytosis noted. Will consult critical care for admission  9:39 PM Spoke with critical care and patient given 100 mg of Lasix IV push for that recommendation. We'll continue to monitor  10:47 PM Patient reexamined and breathing more comfortably at this time. He is currently being seen by the critical care physician  Pt has been made comfort measures only by the critical care physician and he will contact the hospitalist for admission   CRITICAL CARE Performed by: Toy Baker Total critical care time: 60 Critical care time was exclusive of separately billable procedures and treating other patients. Critical care was necessary to treat or prevent imminent or life-threatening deterioration. Critical care  was time spent personally by me on the following activities: development of treatment plan with patient and/or surrogate as well as nursing, discussions with consultants, evaluation of patient's response to treatment, examination of patient, obtaining history from patient or surrogate, ordering and performing treatments and interventions, ordering and review of laboratory studies, ordering and review of radiographic studies, pulse oximetry and re-evaluation of patient's condition.   Toy Baker, MD 04/05/2013 1610  Toy Baker, MD 03/22/2013 [id number]

## 2013-04-05 ENCOUNTER — Encounter (HOSPITAL_COMMUNITY): Payer: Self-pay

## 2013-04-05 DIAGNOSIS — Z515 Encounter for palliative care: Secondary | ICD-10-CM

## 2013-04-05 DIAGNOSIS — R0600 Dyspnea, unspecified: Secondary | ICD-10-CM | POA: Diagnosis present

## 2013-04-05 DIAGNOSIS — IMO0002 Reserved for concepts with insufficient information to code with codable children: Secondary | ICD-10-CM

## 2013-04-05 DIAGNOSIS — R0609 Other forms of dyspnea: Secondary | ICD-10-CM

## 2013-04-05 DIAGNOSIS — R451 Restlessness and agitation: Secondary | ICD-10-CM | POA: Diagnosis present

## 2013-04-05 LAB — MRSA PCR SCREENING: MRSA by PCR: NEGATIVE

## 2013-04-05 MED ORDER — HYDROCHLOROTHIAZIDE 10 MG/ML ORAL SUSPENSION
6.2500 mg | Freq: Every day | ORAL | Status: DC
  Filled 2013-04-05: qty 1.25

## 2013-04-05 MED ORDER — ONDANSETRON HCL 4 MG/2ML IJ SOLN: 4.0000 mg | Freq: Four times a day (QID) | INTRAMUSCULAR | Status: DC | PRN

## 2013-04-05 MED ORDER — OLMESARTAN MEDOXOMIL-HCTZ 40-12.5 MG PO TABS: 0.5000 | ORAL_TABLET | Freq: Every day | ORAL | Status: DC

## 2013-04-05 MED ORDER — IRBESARTAN 150 MG PO TABS
150.0000 mg | ORAL_TABLET | Freq: Every day | ORAL | Status: DC
  Filled 2013-04-05: qty 1

## 2013-04-05 MED ORDER — ACETAMINOPHEN 650 MG RE SUPP: 650.0000 mg | Freq: Four times a day (QID) | RECTAL | Status: DC | PRN

## 2013-04-05 MED ORDER — MORPHINE SULFATE 2 MG/ML IJ SOLN: 2.0000 mg | Freq: Once | INTRAMUSCULAR | Status: AC

## 2013-04-05 MED ORDER — SODIUM CHLORIDE 0.9 % IV SOLN
1.0000 mg/h | INTRAVENOUS | Status: DC
  Administered 2013-04-05: 1 mg/h via INTRAVENOUS
  Administered 2013-04-06 (×2): 2 mg/h via INTRAVENOUS
  Filled 2013-04-05: qty 10

## 2013-04-05 MED ORDER — SODIUM CHLORIDE 0.9 % IJ SOLN
3.0000 mL | Freq: Two times a day (BID) | INTRAMUSCULAR | Status: DC
  Administered 2013-04-05: 3 mL via INTRAVENOUS

## 2013-04-05 MED ORDER — HEPARIN SODIUM (PORCINE) 5000 UNIT/ML IJ SOLN
5000.0000 [IU] | Freq: Three times a day (TID) | INTRAMUSCULAR | Status: DC
  Administered 2013-04-05: 5000 [IU] via SUBCUTANEOUS
  Filled 2013-04-05 (×4): qty 1

## 2013-04-05 MED ORDER — LORAZEPAM 2 MG/ML IJ SOLN
1.0000 mg | INTRAMUSCULAR | Status: DC | PRN
  Administered 2013-04-05 – 2013-04-06 (×3): 1 mg via INTRAVENOUS
  Filled 2013-04-05 (×4): qty 1

## 2013-04-05 MED ORDER — MORPHINE SULFATE 2 MG/ML IJ SOLN
2.0000 mg | INTRAMUSCULAR | Status: DC | PRN
  Administered 2013-04-05 – 2013-04-06 (×7): 2 mg via INTRAVENOUS
  Filled 2013-04-05 (×7): qty 1

## 2013-04-05 MED ORDER — FUROSEMIDE 10 MG/ML IJ SOLN
40.0000 mg | Freq: Two times a day (BID) | INTRAMUSCULAR | Status: DC
  Administered 2013-04-05 (×2): 40 mg via INTRAVENOUS
  Filled 2013-04-05 (×5): qty 4

## 2013-04-05 MED ORDER — ACETAMINOPHEN 325 MG PO TABS: 650.0000 mg | ORAL_TABLET | Freq: Four times a day (QID) | ORAL | Status: DC | PRN

## 2013-04-05 MED ORDER — SODIUM CHLORIDE 0.9 % IJ SOLN: 3.0000 mL | Freq: Two times a day (BID) | INTRAMUSCULAR | Status: DC

## 2013-04-05 MED ORDER — ONDANSETRON HCL 4 MG PO TABS: 4.0000 mg | ORAL_TABLET | Freq: Four times a day (QID) | ORAL | Status: DC | PRN

## 2013-04-05 NOTE — Progress Notes (Signed)
Nutrition Brief Note  Patient identified on the Malnutrition Screening Tool (MST) Report  Wt Readings from Last 15 Encounters:  04/05/13 206 lb 9.1 oz (93.7 kg)  03/29/13 220 lb (99.791 kg)  03/27/13 223 lb 3.2 oz (101.243 kg)  03/13/13 234 lb (106.142 kg)  02/25/13 231 lb 8 oz (105.008 kg)  01/25/13 234 lb 12.8 oz (106.505 kg)  12/17/12 240 lb 9.6 oz (109.135 kg)  12/10/12 244 lb 4.8 oz (110.814 kg)  11/28/12 246 lb 9.6 oz (111.857 kg)  11/22/12 244 lb 1.6 oz (110.723 kg)  10/30/12 247 lb 12.8 oz (112.401 kg)  10/16/12 247 lb 12.8 oz (112.401 kg)  10/02/12 241 lb 1.6 oz (109.362 kg)  09/11/12 243 lb 4.8 oz (110.36 kg)  08/30/12 243 lb 12.8 oz (110.587 kg)    Body mass index is 27.26 kg/(m^2). Patient meets criteria for Overweight based on current BMI.   Pt has no diet order. Per chart pt's son has opted for comfort measures. Per family in the room pt was not eating PTA and pt had to be forced to drink. No nutrition interventions warranted at this time due to comfort care. If nutritional needs arise, please consult RD.   Ian Malkin RD, LDN Inpatient Clinical Dietitian Pager: [phone] After Hours Pager: [phone]

## 2013-04-05 NOTE — Progress Notes (Signed)
CARE MANAGEMENT NOTE 04/05/2013  Patient:  [name], [name]   Account Number:  [id number]  Date Initiated:  04/05/2013  Documentation initiated by:  DAVIS,RHONDA  Subjective/Objective Assessment:   pt with hx of lung ca and recent chemo and rt/present with dyspnea, required bipap and then nrb 15L/o2.     Action/Plan:   tbd will follow for needs maybe hospice patient.   Anticipated DC Date:  04/08/2013   Anticipated DC Plan:  HOME W HOSPICE CARE  In-house referral  NA      DC Planning Services  NA      [facility] Choice  NA   Choice offered to / List presented to:  NA   DME arranged  NA      DME agency  NA     HH arranged  NA      HH agency  NA   Status of service:  In process, will continue to follow Medicare Important Message given?  NA - LOS <3 / Initial given by admissions (If response is "NO", the following Medicare IM given date fields will be blank) Date Medicare IM given:   Date Additional Medicare IM given:    Discharge Disposition:    Per UR Regulation:  Reviewed for med. necessity/level of care/duration of stay  If discussed at Long Length of Stay Meetings, dates discussed:    Comments:  [id number]/Rhonda Stark Jock, BSN, [place] [phone] Chart Reviewed for discharge and hospital needs. Discharge needs at time of review:  None Review of patient progress due on [id number].

## 2013-04-05 NOTE — ED Notes (Signed)
Report given.  Bed not ready and is waiting for housekeeping to come clean room.  Nurse will call when room is ready

## 2013-04-05 NOTE — Progress Notes (Signed)
[facility] PROGRESS NOTE  [name] AVW:[id number] DOB: 1941/04/29 DOA: 03/30/2013 PCP: [name], MD  HPI: [name] is a 72 y.o. male with known history of extensive stage small cell lung cancer presented with increasing shortness of breath which acutely worsened today. Patient has been having shortness of breath for last few days and also has been feeling weak and fatigued. Last week patient did get IV fluids for dehydration. Patient denies any chest pain or productive cough fever chills. In the ER patient was found to be in acute hypoxic respiratory failure and was placed on BiPAP. Critical care was consulted but at this time after discussion with patient and patient's son the goals of care this is to make patient comfortable. No aggressive measures and for now continue with BiPAP and IV Lasix and morphine has been added. No further labs will be ordered. Patient has been having increasing pain particularly low back and also had bowel incontinence is today.  Assessment/Plan: Acute hypoxic respiratory failure from acute pulmonary edema from CHF. Last EF in 2013 was 60% but showed moderate aortic stenosis. Extensive stage small cell lung cancer.  History of moderate aortic stenosis per 2-D echo in 2013.  Acute on chronic renal failure.  History of hypertension.  On admission extensive discussions were held with family members and decision was geared towards comfort rather than aggressive measures. Palliative has been consulted to help manage end of life needs. Appreciate input.    Consultants:  Palliative  Procedures:  none   Antibiotics none  HPI/Subjective: Minimally responsive to voice  Objective: Filed Vitals:   04/05/13 0600 04/05/13 0700 04/05/13 0800 04/05/13 0900  BP: 126/73 136/77 125/70 133/72  Pulse: 112 112 110 146  Temp: 99.3 F (37.4 C) 99.3 F (37.4 C) 99.1 F (37.3 C) 99.1 F (37.3 C)  TempSrc:   Core (Comment)   Resp: 15 14 16 18    Height:      Weight:      SpO2: 99% 98% 93% 94%    Intake/Output Summary (Last 24 hours) at 04/05/13 1338 Last data filed at 04/05/13 0600  Gross per 24 hour  Intake     30 ml  Output    950 ml  Net   -920 ml   Filed Weights   04/05/13 0212  Weight: 93.7 kg (206 lb 9.1 oz)    Exam:  General:  Minimally responsive  Cardiovascular: regular rate and rhythm, without MRG  Respiratory: coarse breath sounds  Abdomen: bowel sounds positive  MSK: no peripheral edema  Data Reviewed: Basic Metabolic Panel:  Recent Labs Lab 03/25/2013 2055  NA 138  K 4.4  CL 98  CO2 21  GLUCOSE 164*  BUN 51*  CREATININE 3.05*  CALCIUM 11.7*   Liver Function Tests:  Recent Labs Lab 03/21/2013 2055  AST 39*  ALT 15  ALKPHOS 174*  BILITOT 0.5  PROT 8.0  ALBUMIN 2.6*   CBC:  Recent Labs Lab 04/11/2013 2055  WBC 15.0*  NEUTROABS 12.5*  HGB 10.1*  HCT 29.9*  MCV 95.8  PLT 302   Cardiac Enzymes:  Recent Labs Lab 03/24/2013 2055  TROPONINI <0.30   BNP (last 3 results)  Recent Labs  03/24/2013 2055  PROBNP 2641.0*   Recent Results (from the past 240 hour(s))  MRSA PCR SCREENING     Status: None   Collection Time    04/05/13  1:59 AM      Result Value Range Status   MRSA by PCR NEGATIVE  NEGATIVE Final   Comment:            The GeneXpert MRSA Assay (FDA     approved for NASAL specimens     only), is one component of a     comprehensive MRSA colonization     surveillance program. It is not     intended to diagnose MRSA     infection nor to guide or     monitor treatment for     MRSA infections.     Studies: Dg Chest Port 1 View  04/16/13   CLINICAL DATA:  Chest pain, smoker  EXAM: PORTABLE CHEST - 1 VIEW  COMPARISON:  03/29/2013  FINDINGS: Heart is enlarged with diffuse edema pattern throughout both lungs. Moderate right effusion extending along the right lateral chest superiorly. Trachea is midline. Atherosclerosis of the aorta. Mediastinal adenopathy and  right lower lobe central mass not well appreciated by plain radiography. Please refer to the prior CT 02/22/2013. Monitor leads overlie the chest.  IMPRESSION: Diffuse edema pattern with a right effusion. Pattern consistent with CHF.   Electronically Signed   By: Ruel Favors M.D.   On: April 16, 2013 21:21    Scheduled Meds: . furosemide  40 mg Intravenous Q12H  . sodium chloride  3 mL Intravenous Q12H  . sodium chloride  3 mL Intravenous Q12H   Continuous Infusions:   Principal Problem:   Acute respiratory failure Active Problems:   Small cell lung carcinoma   HTN (hypertension)   CKD (chronic kidney disease) stage 3, GFR 30-59 ml/min  Time spent: 25  Pamella Pert, MD [facility] Pager [phone]. If 7 PM - 7 AM, please contact night-coverage at [email or web address], password [facility] 04/05/2013, 1:38 PM  LOS: 1 day

## 2013-04-05 NOTE — Consult Note (Signed)
Patient [name]      DOB: [birth year]      PIR:[id number]     Consult Note from the Palliative Medicine Team at [facility]    Consult Requested by: Dr Elvera Lennox     PCP: [name], MD Reason for Consultation:Clarification of GOC and options     Phone Number:[phone]  Assessment of patients Current state:Jad Prom is a 72 y.o. male with known history of extensive stage small cell lung cancer presented with increasing shortness of breath  Family reports physical and functional decline over the past many weeks, poor tolerance of chemotherapy treatments, and making decision for full comfort care at this time   Consult is for review of medical treatment options, clarification of goals of care and end of life issues, disposition and options, and symptom recommendation.  This NP Lorinda Creed reviewed medical records, received report from team, assessed the patient and then meet at bedside with his brother Kaito Schulenburg, and spoke by telephone with his step son Carolanne Grumbling # [phone] and his wife Molinda Bailiff # [phone] and daughter Roni Bread # [phone]  to discuss diagnosis prognosis, GOC, EOL wishes disposition and options. Left message for daughter Mingo Amber # [phone].  Spoke with daughter Priscella Mann [phone], she expressed her focus for her father is comfort and a transition with little suffering.  Per family [facility]) patient's wife is unable to physically or cognitively be prestn, he tells me she does understand the overall situation.    A detailed discussion was had today regarding advanced directives.  Concepts specific to code status, artifical feeding and hydration, continued IV antibiotics and rehospitalization was had.  The difference between a aggressive medical intervention path  and a palliative comfort care path for this patient at this time was had.  Values and goals of care important to patient and family were attempted  to be elicited.  Concept of Hospice and Palliative Care were discussed  Natural trajectory and expectations at EOL were discussed.  Questions and concerns addressed.  Family encouraged to call with questions or concerns.  PMT will continue to support holistically.    Goals of Care: 1.  Code Status: DNR/DNI-comfort is main focus of care   2. Scope of Treatment: 1. Vital Signs:daily  2. Respiratory/Oxygen:for comfort only, no BiPap 3. Nutritional Support/Tube Feeds: no artificail feeding now or in the future 4. Antibiotics:none 5. Blood Products:none 6. IVF:  KVO for meds 7. Review of Medications to be discontinued: minimize for comfort 8. Labs:none 9. Telemetry:none 10. Consults:no further diagnostics or treatment for cancer diagnosis   4. Disposition: Dependant on outcomes, expect hospital death   3. Symptom Management:   1. Anxiety/Agitation: Ativan 1 mg IV every 4 hrs prn 2. Pain/Dyspnea: Morphine 2 mg IV every 1 hr prn 3. Terminal Secretions: Scopolamine patch  4. Psychosocial:  Emotional support to family at bedside and family via telephone     Brief HPI:  [name] is a 72 y.o. male with known history of extensive stage small cell lung cancer presented with increasing shortness of breath  Family reports physical and functional decline over the past many weeks, poor tolerance of chemotherapy treatments, and making decision for full comfort care at this time   ROS: unable to illict    PMH:  Past Medical History  Diagnosis Date  . Hypertension   . ALLERGIC RHINITIS   . Malignant neoplasm of bronchus and lung, unspecified site 01/23/2012  . S/P radiation therapy  12/06/2012-12/20/2012  Mediastinum, Right Lung / 30 Gy /10 fractions  . Status post chemotherapy     2 cycles Of Carboplatin/Etoposide - discontinued due to diesease progression: 5 cycles of Cisplatin and Irinotecan: disease progression: 11 cycles of Paclitaxel   . Renal insufficiency       PSH: Past Surgical History  Procedure Laterality Date  . Wrist surgery  1961    right wrist  . Orif ankle fracture  05/07/2012    Procedure: OPEN REDUCTION INTERNAL FIXATION (ORIF) ANKLE FRACTURE;  Surgeon: Mable Paris, MD;  Location: [facility];  Service: Orthopedics;  Laterality: Right;   I have reviewed the FH and SH and  If appropriate update it with new information. No Known Allergies Scheduled Meds: . furosemide  40 mg Intravenous Q12H  . sodium chloride  3 mL Intravenous Q12H  . sodium chloride  3 mL Intravenous Q12H   Continuous Infusions:  PRN Meds:.acetaminophen, LORazepam, morphine injection, ondansetron (ZOFRAN) IV, ondansetron    BP 133/72  Pulse 146  Temp(Src) 99.1 F (37.3 C) (Core (Comment))  Resp 18  Ht 6\' 1"  (1.854 m)  Wt 93.7 kg (206 lb 9.1 oz)  BMI 27.26 kg/m2  SpO2 94%   PPS:20 %   Intake/Output Summary (Last 24 hours) at 04/05/13 1050 Last data filed at 04/05/13 0600  Gross per 24 hour  Intake     30 ml  Output    950 ml  Net   -920 ml    Physical Exam:  General: ill appearing, minimally responsive, easily agitated HEENT:  Dry mucous membranes, no exudate Chest:   Diminished in bases, scattered coarse BS CVS: tachycardic Abdomen: soft NT decreased BS Ext:  Without edema Neuro:minimlaly responsive   Labs: CBC    Component Value Date/Time   WBC 15.0* 03/23/2013 2055   WBC 6.4 03/27/2013 0953   RBC 3.12* 03/27/2013 2055   RBC 2.75* 03/27/2013 0953   HGB 10.1* 04/05/2013 2055   HGB 9.1* 03/27/2013 0953   HCT 29.9* 03/28/2013 2055   HCT 26.4* 03/27/2013 0953   PLT 302 03/17/2013 2055   PLT 146 03/27/2013 0953   MCV 95.8 04/05/2013 2055   MCV 95.9 03/27/2013 0953   MCH 32.4 03/22/2013 2055   MCH 33.1 03/27/2013 0953   MCHC 33.8 03/31/2013 2055   MCHC 34.6 03/27/2013 0953   RDW 15.8* 03/28/2013 2055   RDW 13.6 03/27/2013 0953   LYMPHSABS 0.7 03/23/2013 2055   LYMPHSABS 0.4* 03/27/2013 0953   MONOABS 1.7*  03/30/2013 2055   MONOABS 0.7 03/27/2013 0953   EOSABS 0.0 03/13/2013 2055   EOSABS 0.0 03/27/2013 0953   BASOSABS 0.1 03/15/2013 2055   BASOSABS 0.0 03/27/2013 0953    BMET    Component Value Date/Time   NA 138 03/27/2013 2055   NA 135* 03/27/2013 0953   K 4.4 03/22/2013 2055   K 4.5 03/27/2013 0953   CL 98 03/31/2013 2055   CL 105 11/20/2012 0939   CO2 21 03/30/2013 2055   CO2 25 03/27/2013 0953   GLUCOSE 164* 03/26/2013 2055   GLUCOSE 131 03/27/2013 0953   GLUCOSE 172* 11/20/2012 0939   BUN 51* 04/11/2013 2055   BUN 37.7* 03/27/2013 0953   CREATININE 3.05* 03/29/2013 2055   CREATININE 2.5* 03/27/2013 0953   CALCIUM 11.7* 04/08/2013 2055   CALCIUM 11.0* 03/27/2013 0953   GFRNONAA 19* 03/26/2013 2055   GFRAA 22* 04/11/2013 2055    CMP     Component Value Date/Time   NA 138 04/11/2013  2055   NA 135* 03/27/2013 0953   K 4.4 04-29-2013 2055   K 4.5 03/27/2013 0953   CL 98 2013/04/29 2055   CL 105 11/20/2012 0939   CO2 21 04-29-2013 2055   CO2 25 03/27/2013 0953   GLUCOSE 164* 04-29-2013 2055   GLUCOSE 131 03/27/2013 0953   GLUCOSE 172* 11/20/2012 0939   BUN 51* 04-29-13 2055   BUN 37.7* 03/27/2013 0953   CREATININE 3.05* 04/29/13 2055   CREATININE 2.5* 03/27/2013 0953   CALCIUM 11.7* April 29, 2013 2055   CALCIUM 11.0* 03/27/2013 0953   PROT 8.0 Apr 29, 2013 2055   PROT 7.6 03/27/2013 0953   ALBUMIN 2.6* 04/29/13 2055   ALBUMIN 2.6* 03/27/2013 0953   AST 39* 29-Apr-2013 2055   AST 16 03/27/2013 0953   ALT 15 Apr 29, 2013 2055   ALT 13 03/27/2013 0953   ALKPHOS 174* 04/29/13 2055   ALKPHOS 123 03/27/2013 0953   BILITOT 0.5 04-29-2013 2055   BILITOT 0.48 03/27/2013 0953   GFRNONAA 19* 29-Apr-2013 2055   GFRAA 22* 04/29/2013 2055      Time In Time Out Total Time Spent with Patient Total Overall Time  1030 1145 70 min 75 min    Greater than 50%  of this time was spent counseling and coordinating care related to the above assessment and plan.  Lorinda Creed NP  Palliative Medicine Team Team Phone # [phone] Pager [phone]  Discussed with Dr Elvera Lennox

## 2013-04-05 NOTE — Progress Notes (Signed)
Pt currently off BIPAP and on 100% NRB and tolerating well at this time.  Pt is comfort care at this point.  RT to monitor and assess as needed.

## 2013-04-05 NOTE — ED Notes (Signed)
Attempted to call report.  Nurse busy.  States he will call shortly

## 2013-04-06 DIAGNOSIS — C3490 Malignant neoplasm of unspecified part of unspecified bronchus or lung: Secondary | ICD-10-CM

## 2013-04-06 DIAGNOSIS — E872 Acidosis: Secondary | ICD-10-CM

## 2013-04-06 DIAGNOSIS — D649 Anemia, unspecified: Secondary | ICD-10-CM | POA: Diagnosis present

## 2013-04-06 DIAGNOSIS — D72829 Elevated white blood cell count, unspecified: Secondary | ICD-10-CM | POA: Diagnosis present

## 2013-04-06 DIAGNOSIS — R7989 Other specified abnormal findings of blood chemistry: Secondary | ICD-10-CM | POA: Diagnosis present

## 2013-04-06 DIAGNOSIS — R748 Abnormal levels of other serum enzymes: Secondary | ICD-10-CM | POA: Diagnosis present

## 2013-04-06 DIAGNOSIS — I5033 Acute on chronic diastolic (congestive) heart failure: Principal | ICD-10-CM | POA: Diagnosis present

## 2013-04-06 MED ORDER — ATROPINE SULFATE 1 % OP SOLN
1.0000 [drp] | Freq: Four times a day (QID) | OPHTHALMIC | Status: DC | PRN
  Administered 2013-04-06: 1 [drp] via SUBLINGUAL
  Filled 2013-04-06: qty 2

## 2013-04-10 ENCOUNTER — Other Ambulatory Visit: Payer: Medicare Other | Admitting: Lab

## 2013-04-10 ENCOUNTER — Ambulatory Visit: Payer: Medicare Other

## 2013-04-11 LAB — CULTURE, BLOOD (ROUTINE X 2): Culture: NO GROWTH

## 2013-04-13 NOTE — Progress Notes (Signed)
Observed waste of 50 mls of morphine 1mg /ml solution.

## 2013-04-13 NOTE — Discharge Summary (Signed)
Physician Death Summary  [name] NWG:[id number] DOB: August 19, 1940 DOA: 04-20-13  PCP: [name], MD  Admit date: 04-20-13 Death date: 04/22/2013, 7:30 am  Time spent: 35 minutes  Discharge Diagnoses:  Principal Problem:   Acute respiratory failure Active Problems:   Small cell lung carcinoma   HTN (hypertension)   CKD (chronic kidney disease) stage 3, GFR 30-59 ml/min   Palliative care encounter   Dyspnea   Agitation   Acute on chronic diastolic heart failure   Elevated liver enzymes   Elevated lactic acid level   Anemia   Leukocytosis, unspecified  History of present illness:  [name] is a 72 y.o. male with known history of extensive stage small cell lung cancer presented with increasing shortness of breath which acutely worsened today. Patient has been having shortness of breath for last few days and also has been feeling weak and fatigued. Last week patient did get IV fluids for dehydration. Patient denies any chest pain or productive cough fever chills. In the ER patient was found to be in acute hypoxic respiratory failure and was placed on BiPAP. Critical care was consulted but at this time after discussion with patient and patient's son the goals of care this is to make patient comfortable. No aggressive measures and for now continue with BiPAP and IV Lasix and morphine has been added. No further labs will be ordered. Patient has been having increasing pain particularly low back and also had bowel incontinence is today. Recent notes from Dr. Arbutus Ped patient's oncologist patient was advised about hospice care.  Hospital Course:  Acute hypoxic respiratory failure from acute pulmonary edema from acute on chronic diastolic heart failure. Last EF in 2013 was 60% but showed moderate aortic stenosis and diastolic dysfunction. Extensive stage small cell lung cancer. On admission patient and patient's son has opted for comfort measures. Patient was initially started on  BiPAP IV Lasix and IV morphine. No further labs ordered as per the discussion with the family and patient. Palliative was consulted and patient was transitioned to full comfort care on 10/24 and was transferred to regular floor. He passed away on 2013-04-22 at 7:30 am. History of moderate aortic stenosis per 2-D echo in 2013. Acute on chronic renal failure. History of hypertension. Elevated lactic acid Leukocytosis Anemia Elevated liver enzymes   Procedures:  none   Consultations:  Palliative    Significant Diagnostic Studies: Dg Chest 1 View  03/29/2013   CLINICAL DATA:  POST THORACENTESIS.  EXAM: CHEST - 1 VIEW  COMPARISON:  03/29/2013 11:13 A.M.  FINDINGS: EXAM LIMITED BY TECHNIQUE AND PATIENT'S HABITUS. ARTIFACT PROJECTS OVER THE LATERAL ASPECT OF THE RIGHT CHEST WITHOUT GROSS PNEUMOTHORAX NOTED.  RIGHT-SIDED PLEURAL EFFUSION REMAINS. CT DETECTED MALIGNANCY RIGHT LUNG MALIGNANCY ADEQUATELY DELINEATED ON THE PRESENT EXAM.  PULMONARY VASCULAR CONGESTION.  HEART APPEARS PROMINENT.  IMPRESSION: EXAM LIMITED BY TECHNIQUE AND PATIENT'S HABITUS. ARTIFACT PROJECTS OVER THE LATERAL ASPECT OF THE RIGHT CHEST WITHOUT GROSS PNEUMOTHORAX NOTED.  PLEASE SEE ABOVE.   Electronically Signed   By: Bridgett Larsson M.D.   On: 03/29/2013 15:07   Dg Chest 2 View  03/29/2013   CLINICAL DATA:  Cough, shortness of breath.  EXAM: CHEST  2 VIEW  COMPARISON:  May 05, 2012.  FINDINGS: There is nearly complete opacification of the right hemothorax concerning for large pleural effusion with probable associated pneumonia or atelectasis. Some mediastinal shift to the left is noted. Left lung is clear. Bony thorax is intact. No pneumothorax is seen.  IMPRESSION:  Interval development of large right pleural effusion with associated pneumonia or atelectasis. This results in mild mediastinal shift to the left.   Electronically Signed   By: Roque Lias M.D.   On: 03/29/2013 11:59   Dg Chest Port 1 View  April 19, 2013    CLINICAL DATA:  Chest pain, smoker  EXAM: PORTABLE CHEST - 1 VIEW  COMPARISON:  03/29/2013  FINDINGS: Heart is enlarged with diffuse edema pattern throughout both lungs. Moderate right effusion extending along the right lateral chest superiorly. Trachea is midline. Atherosclerosis of the aorta. Mediastinal adenopathy and right lower lobe central mass not well appreciated by plain radiography. Please refer to the prior CT 02/22/2013. Monitor leads overlie the chest.  IMPRESSION: Diffuse edema pattern with a right effusion. Pattern consistent with CHF.   Electronically Signed   By: Ruel Favors M.D.   On: 2013-04-19 21:21   US Thoracentesis Asp Pleural Space W/img Guide  03/29/2013   CLINICAL DATA:  History of extensive small cell lung cancer. History of radiation therapy to right chest. Right-sided pleural effusion with acute shortness of breath. Request therapeutic thoracentesis.  EXAM: ULTRASOUND GUIDED right THORACENTESIS  COMPARISON:  None.  FINDINGS: A total of approximately 600 mL of dark bloody pleural fluid was removed. A fluid sample was notsent for laboratory analysis.  IMPRESSION: Successful ultrasound guided right thoracentesis yielding 600 mL of pleural fluid.  Read by: Brayton El PA-C  PROCEDURE: An ultrasound guided thoracentesis was thoroughly discussed with the patient and questions answered. The benefits, risks, alternatives and complications were also discussed. The patient understands and wishes to proceed with the procedure. Written consent was obtained.  Ultrasound was performed to localize and mark an adequate pocket of fluid in the right chest. The area was then prepped and draped in the normal sterile fashion. 1% Lidocaine was used for local anesthesia. Under ultrasound guidance a 19 gauge Yueh catheter was introduced. Thoracentesis was performed. The catheter was removed and a dressing applied.  Complications:  None immediate   Electronically Signed   By: Richarda Overlie M.D.   On:  03/29/2013 14:51    Microbiology: Recent Results (from the past 240 hour(s))  CULTURE, BLOOD (ROUTINE X 2)     Status: None   Collection Time    04/19/13  9:05 PM      Result Value Range Status   Specimen Description BLOOD LEFT ARM   Final   Special Requests BOTTLES DRAWN AEROBIC AND ANAEROBIC 4CC   Final   Culture  Setup Time     Final   Value: 04/05/2013 01:45     Performed at [facility]   Culture     Final   Value:        BLOOD CULTURE RECEIVED NO GROWTH TO DATE CULTURE WILL BE HELD FOR 5 DAYS BEFORE ISSUING A FINAL NEGATIVE REPORT     Performed at [facility]   Report Status PENDING   Incomplete  CULTURE, BLOOD (ROUTINE X 2)     Status: None   Collection Time    2013-04-19  9:12 PM      Result Value Range Status   Specimen Description BLOOD LEFT HAND   Final   Special Requests BOTTLES DRAWN AEROBIC AND ANAEROBIC 4CC   Final   Culture  Setup Time     Final   Value: 04/05/2013 01:43     Performed at [facility]   Culture     Final   Value:  BLOOD CULTURE RECEIVED NO GROWTH TO DATE CULTURE WILL BE HELD FOR 5 DAYS BEFORE ISSUING A FINAL NEGATIVE REPORT     Performed at [facility]   Report Status PENDING   Incomplete  MRSA PCR SCREENING     Status: None   Collection Time    04/05/13  1:59 AM      Result Value Range Status   MRSA by PCR NEGATIVE  NEGATIVE Final   Comment:            The GeneXpert MRSA Assay (FDA     approved for NASAL specimens     only), is one component of a     comprehensive MRSA colonization     surveillance program. It is not     intended to diagnose MRSA     infection nor to guide or     monitor treatment for     MRSA infections.     Labs: Basic Metabolic Panel:  Recent Labs Lab 04/11/2013 2055  NA 138  K 4.4  CL 98  CO2 21  GLUCOSE 164*  BUN 51*  CREATININE 3.05*  CALCIUM 11.7*   Liver Function Tests:  Recent Labs Lab 04/11/2013 2055  AST 39*  ALT 15  ALKPHOS 174*  BILITOT 0.5  PROT 8.0   ALBUMIN 2.6*   CBC:  Recent Labs Lab 03/16/2013 2055  WBC 15.0*  NEUTROABS 12.5*  HGB 10.1*  HCT 29.9*  MCV 95.8  PLT 302   Cardiac Enzymes:  Recent Labs Lab 03/29/2013 2055  TROPONINI <0.30   BNP: BNP (last 3 results)  Recent Labs  03/24/2013 2055  PROBNP 2641.0*   Signed:  Pamella Pert  [facility] 04/09/2013, 11:30 AM

## 2013-04-13 NOTE — Progress Notes (Signed)
Upon entry to patient's room daughter was sitting at patients bedside crying and said her father was "gone". Assessed patient and found no signs of life. Patient has no blood pressure, is apneic, no breath sounds audible on auscultation. Patient is pulseless on auscultation and palpation. Pupils are fixed and dilated. Will notify MD.

## 2013-04-13 NOTE — Progress Notes (Signed)
Patient expired at 0730.  Wasted 50 mls of morphine 1mg /56ml IV solution.

## 2013-04-13 DEATH — deceased

## 2013-04-23 NOTE — Consult Note (Signed)
I have reviewed and discussed the care of this patient in detail with the nurse practitioner including pertinent patient records, physical exam findings and data. I agree with details of this encounter.  

## 2013-08-08 NOTE — Addendum Note (Signed)
Encounter addended by: Deirdre Evener, RN on: 08/08/2013 12:06 PM<BR>     Documentation filed: Charges VN

## 2014-02-04 ENCOUNTER — Other Ambulatory Visit: Payer: Self-pay | Admitting: Pharmacist

## 2014-06-11 IMAGING — CR DG CHEST 2V
2 series · 2 of 2 positions shown · non-contrast
Comparison: 03/09/2012, 01/27/2012, 01/18/2012

CLINICAL DATA: Lung cancer, syncope, weakness

CHEST - 2 VIEW

[w chest pa]
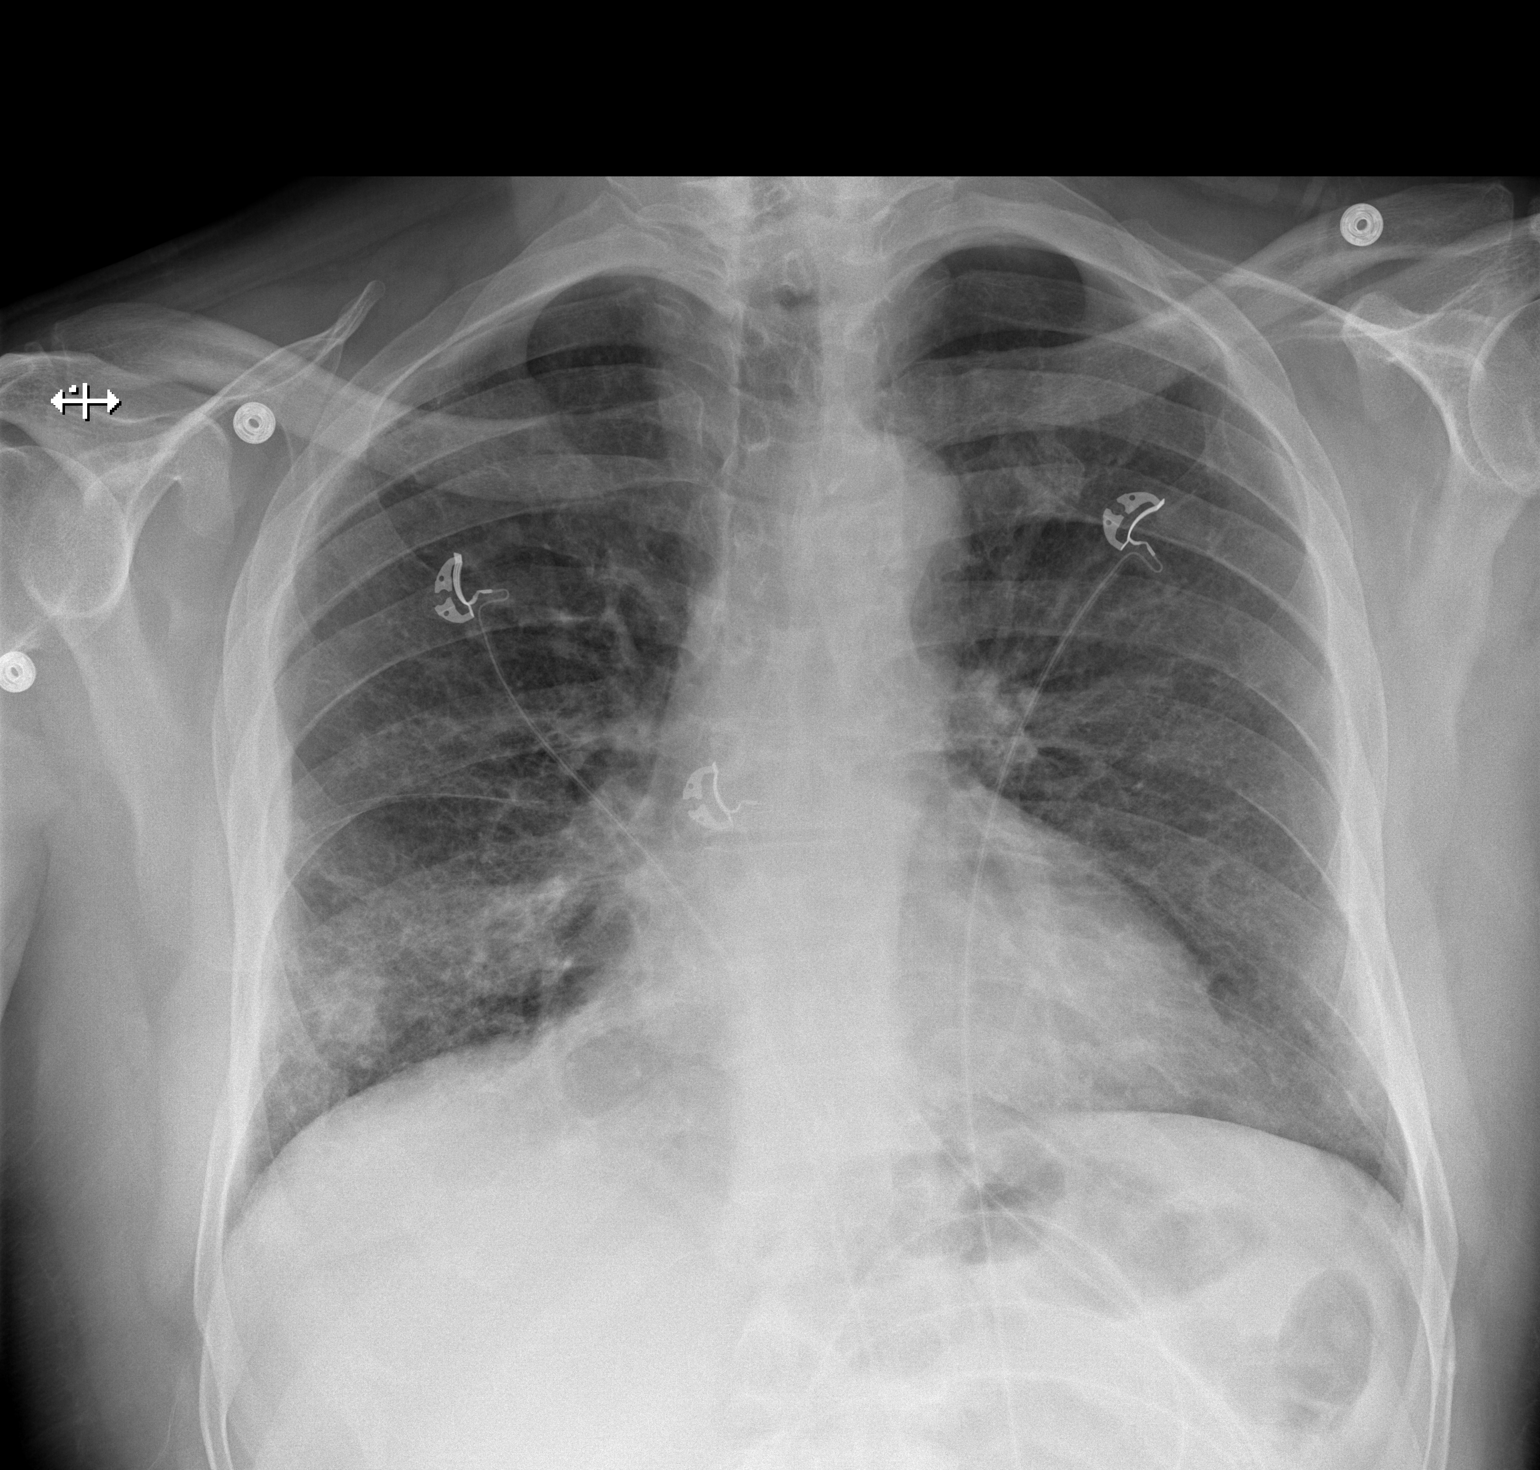

[w chest lat]
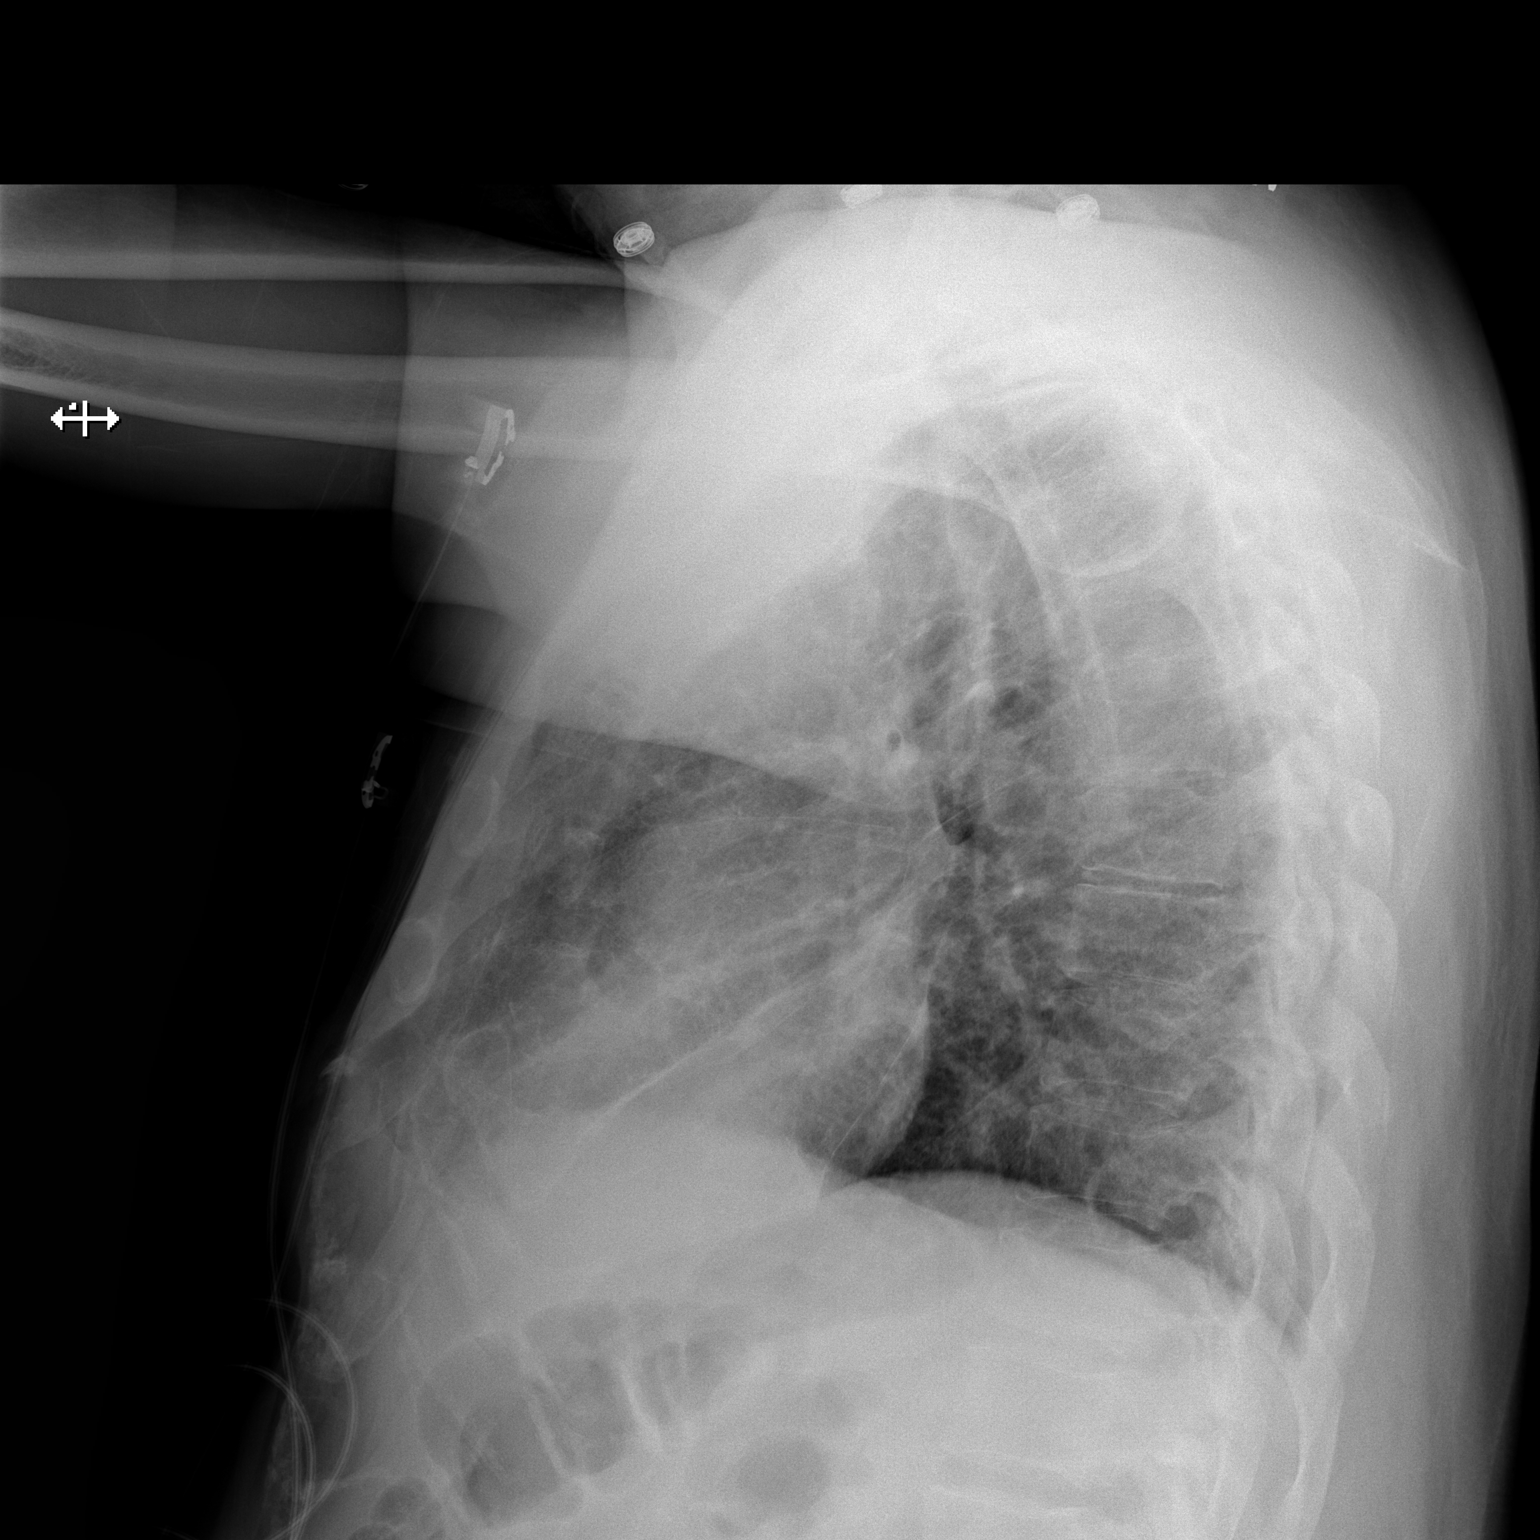

[2 of 2 positions shown; findings below may reference images not displayed]

FINDINGS: Nodular opacities in the right lower lobe noted
compatible with known lung cancer.  Compared to 01/18/2012, there
is slight improvement in the right lower lobe aeration compatible
with resolving pneumonia or postobstructive process.  No large
effusion or pneumothorax.  Left lung remains relatively clear.
Stable heart size and vascularity.  Trachea is midline.  Slight
pleural thickening on the right, stable.
IMPRESSION: Right lower lobe nodular opacities compatible with known lung
cancer.  Improving right lower lobe aeration compatible with
resolving pneumonia/post obstructive pneumonitis.

## 2014-06-13 IMAGING — RF DG ANKLE 2V *R*
1 series · 2 of 2 positions shown · non-contrast
Comparison: Right ankle radiographs - 05/05/2012

CLINICAL DATA: Right ankle plate and screw placement

DG C-ARM 1-60 MIN - NRPT [facility], RIGHT ANKLE - 2 VIEW

[Series 1: run · 2 of 2 slices shown]
[im 1/2]
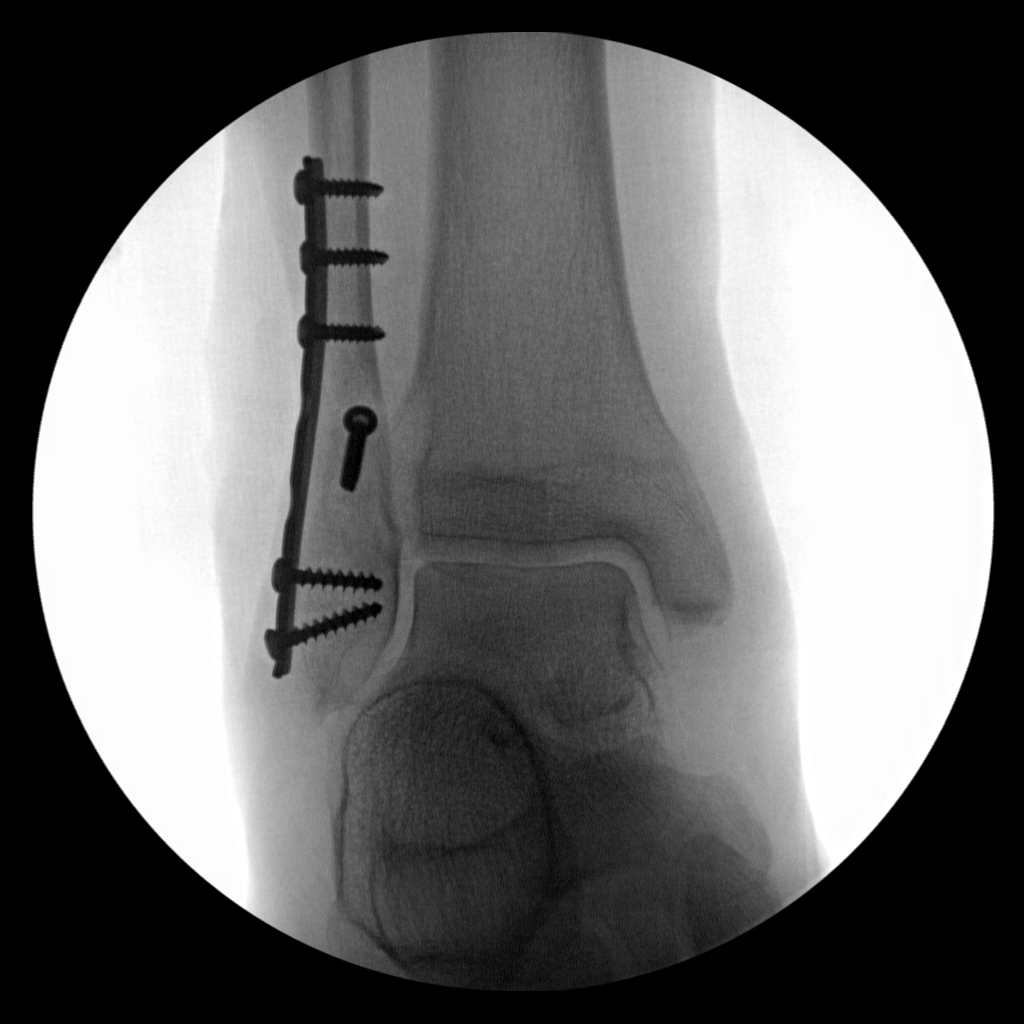
[im 2/2]
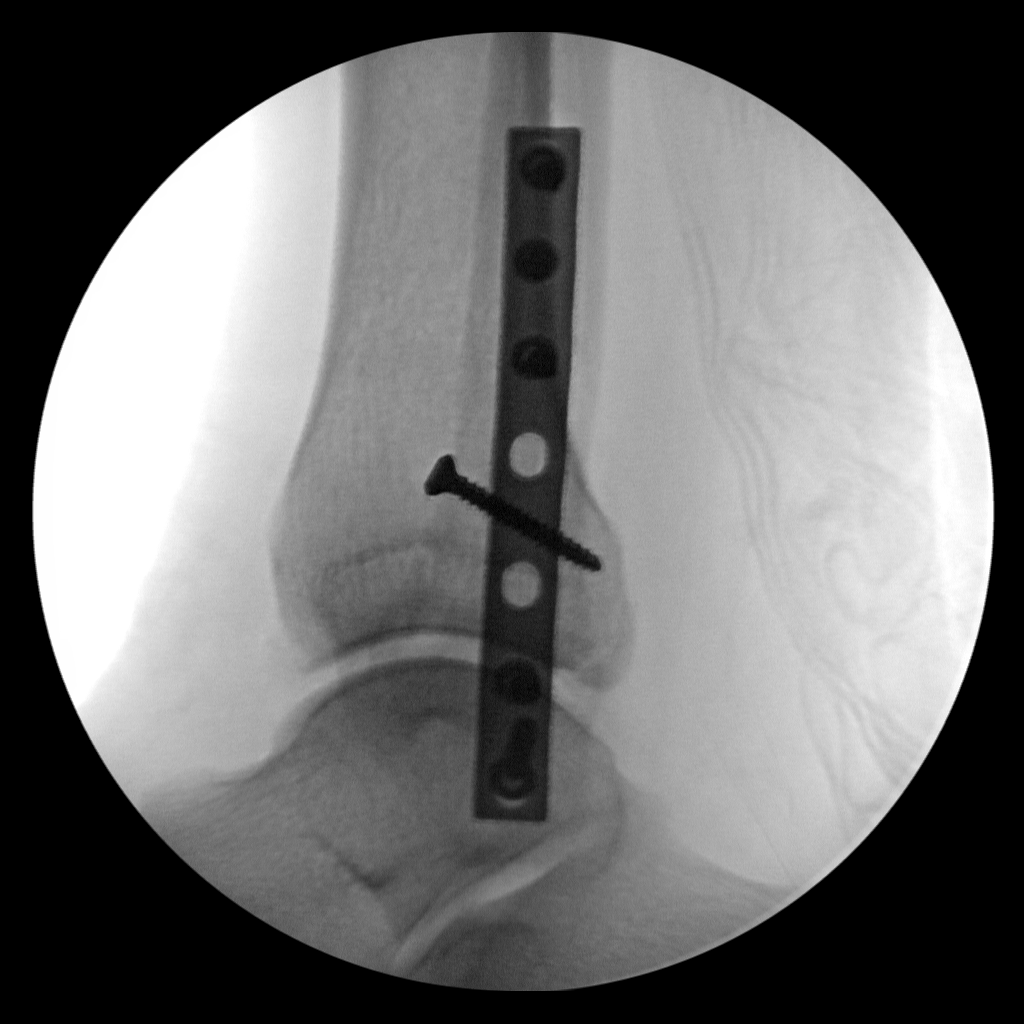

[2 of 2 positions shown; findings below may reference images not displayed]

FINDINGS: Two spot intraoperative fluoroscopic images of the right ankle are
provided for review.  The patient has undergone side plate fixation
of previously noted displaced distal fibular metadiaphysis
fracture.  There is an additional crossing cancellous screws seen
at the fracture site.  Alignment appears anatomic.  The ankle
mortise is preserved.  There is a minimal amount of soft tissue
swelling about the operative site.  No radiopaque foreign body.
IMPRESSION: Post ORIF of distal fibular fracture without complicating feature.

## 2014-09-12 ENCOUNTER — Telehealth: Payer: Self-pay

## 2014-09-12 NOTE — Telephone Encounter (Signed)
09/12/14 Disc Received from [facility] and filed on shelf.[name]

## 2014-10-03 IMAGING — CT CT ABD-PELV W/O CM
2 of 4 series · 16 of 46 positions shown, 18 images · non-contrast
Comparison: CT 06/08/2012

CT CHEST

CLINICAL DATA: Lung cancer, chemotherapy ongoing, right flank pain

CT CHEST, ABDOMEN AND PELVIS WITHOUT CONTRAST
TECHNIQUE: Multidetector CT imaging of the chest, abdomen and
pelvis was performed following the standard protocol without IV
contrast.

[Series 2: cap w/o w/o st · axial · non-contrast · 0.80mm/px · z∈[-663,-53]mm · 13 of 136 slices shown, 15 images]
[im 7/136  soft-tissue]
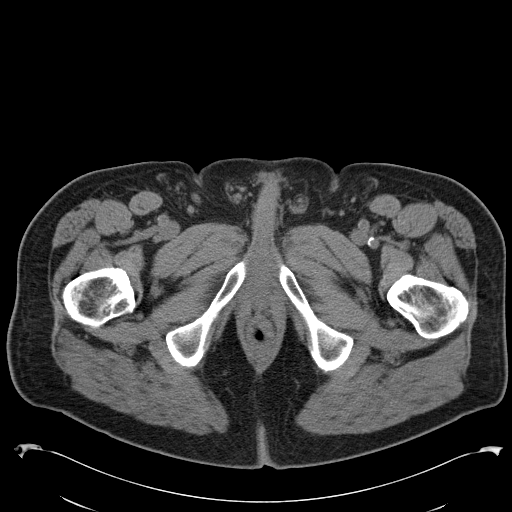
[im 7/136  bone]
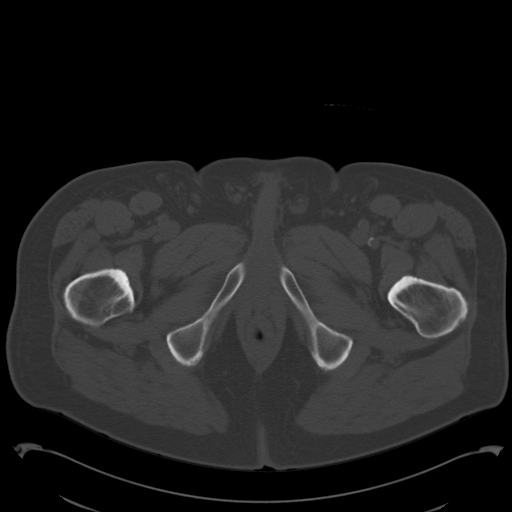
[im 20/136  soft-tissue]
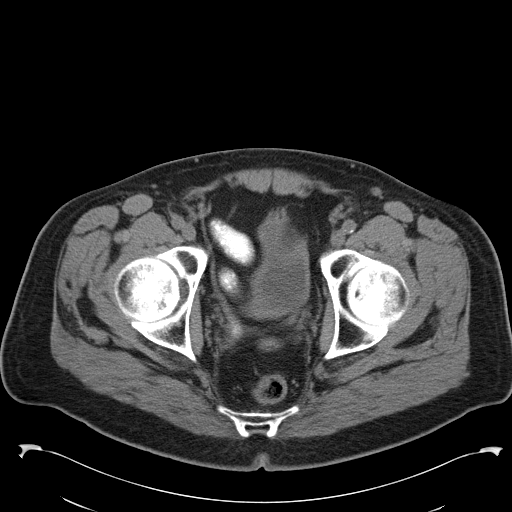
[im 26/136  soft-tissue]
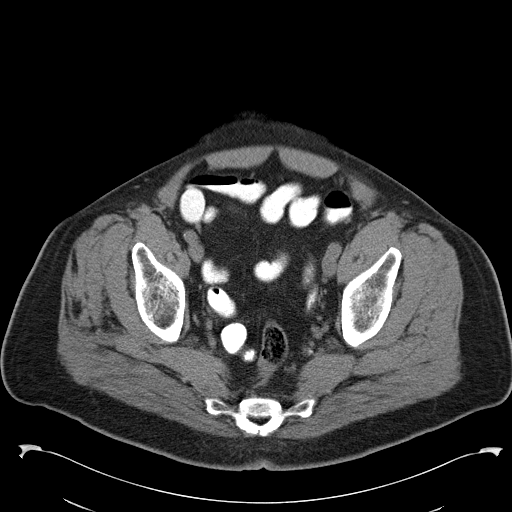
[im 39/136  soft-tissue]
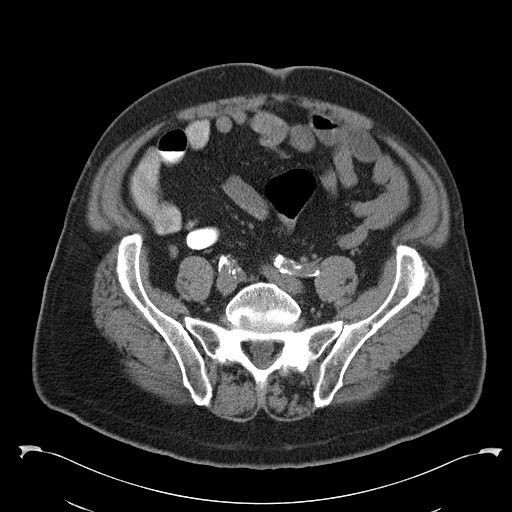
[im 46/136  soft-tissue]
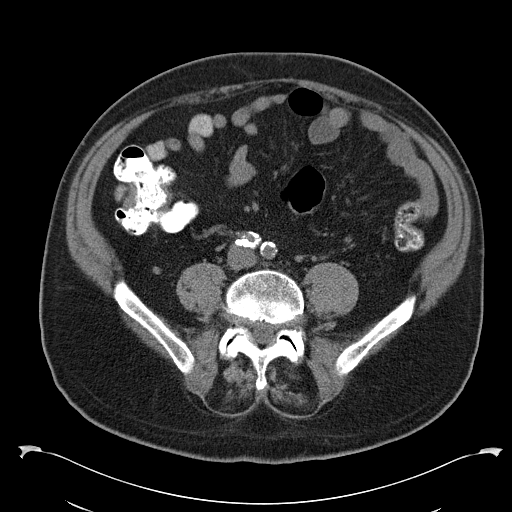
[im 58/136  soft-tissue]
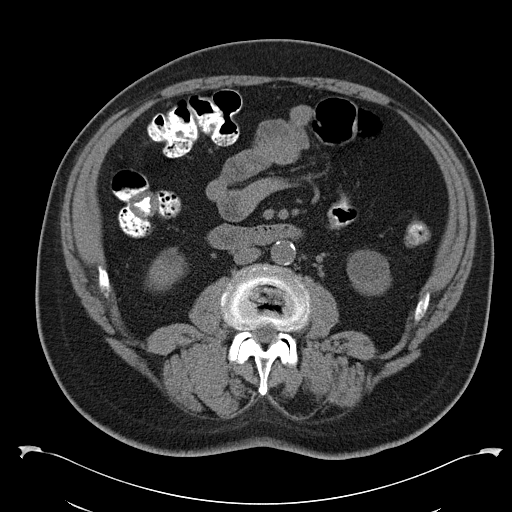
[im 71/136  soft-tissue]
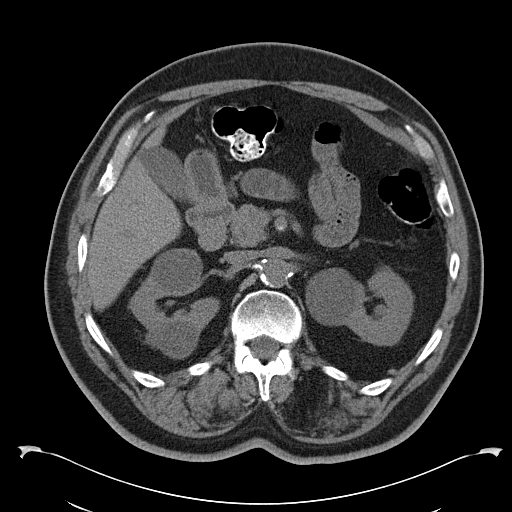
[im 78/136  soft-tissue]
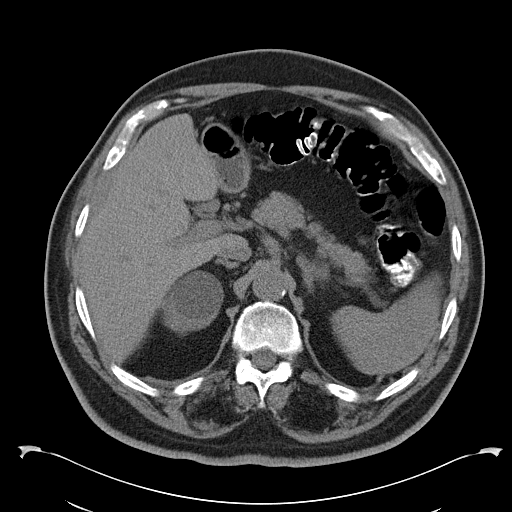
[im 91/136  soft-tissue]
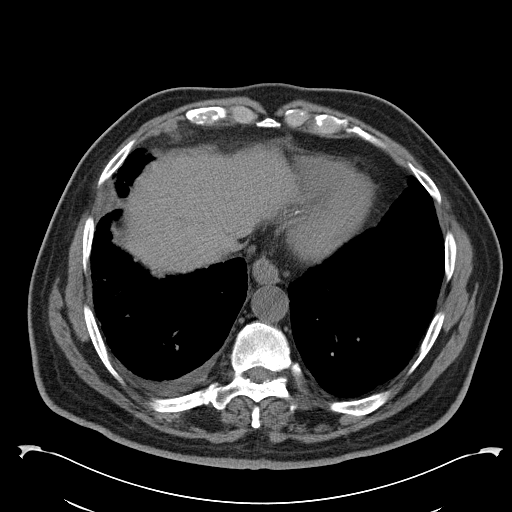
[im 91/136  bone]
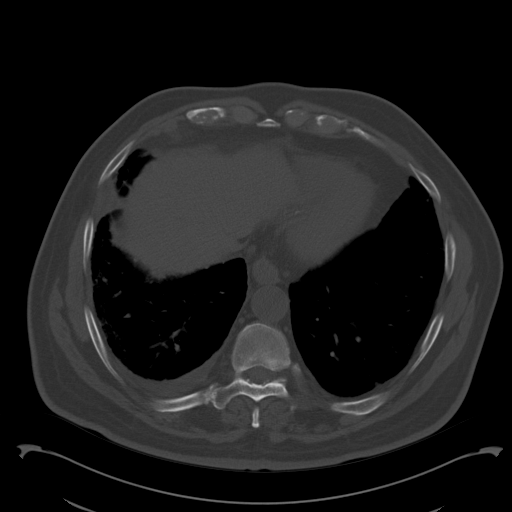
[im 97/136  soft-tissue]
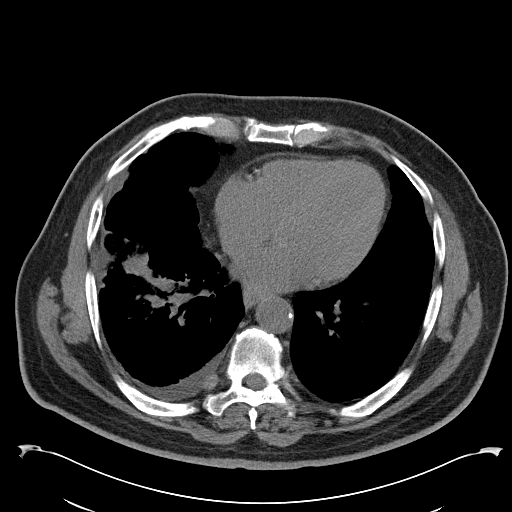
[im 110/136  soft-tissue]
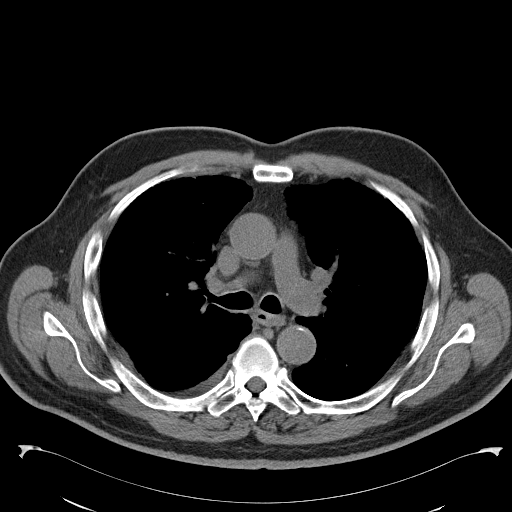
[im 116/136  soft-tissue]
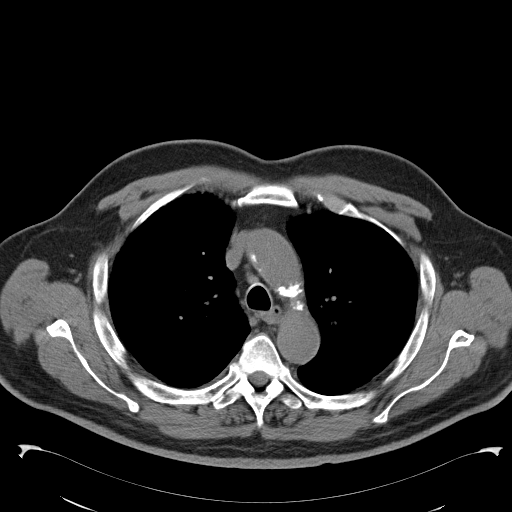
[im 129/136  soft-tissue]
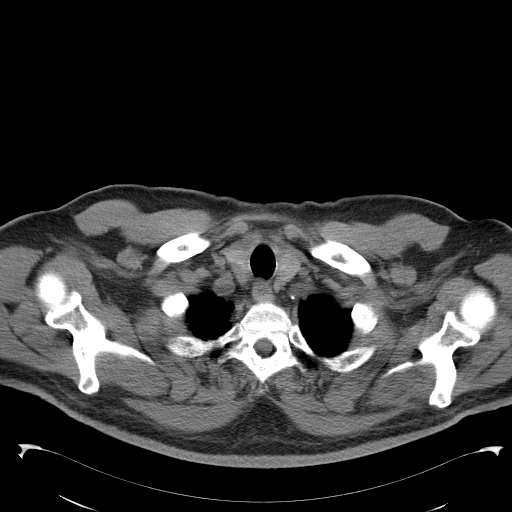

[Series 602: coronal images · coronal · 1.32mm/px · 3 of 113 slices shown]
[im 38/113  soft-tissue]
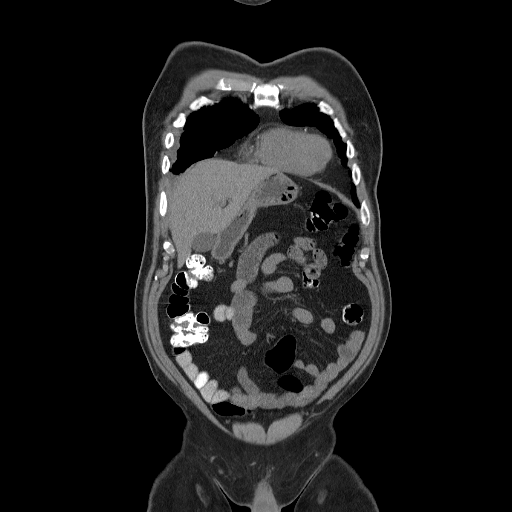
[im 50/113  soft-tissue]
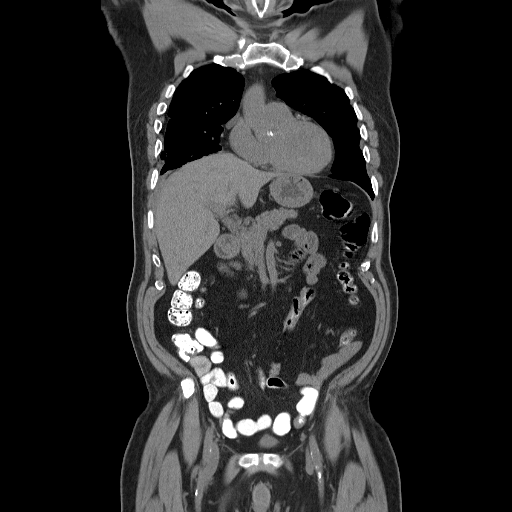
[im 63/113  soft-tissue]
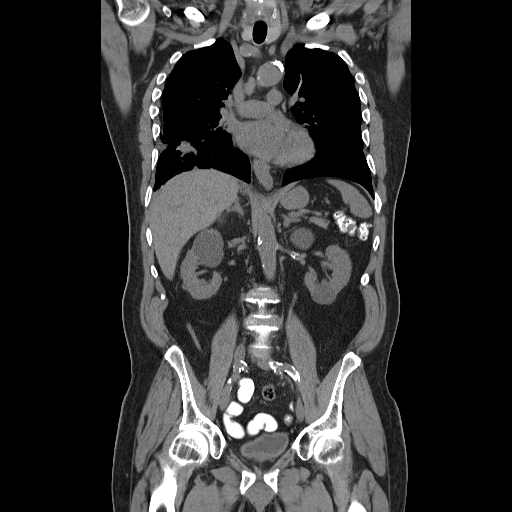

[16 of 46 positions shown; findings below may reference images not displayed]

FINDINGS: No axillary or supraclavicular lymphadenopathy.  No
mediastinal or hilar lymphadenopathy.  No pericardial fluid.

Review of the lung parenchyma demonstrates interval enlargement of
a pleural nodule within the right lateral hemithorax measuring 14
mm (image 32) increased from 9 mm on prior.  Nodular parenchymal
lesion in a branching pattern extending from the right lower lobe
measures 30 mm x 21 mm (image 41) increased from 17 x 13 mm on
prior.  This pattern is concerning for lymphangitic spread of
carcinoma.

Round nodule over the left hemi diaphragm measures 9 mm (image 45)
unchanged from 9 mm on prior.

There is interval increase in size of right lower paratracheal
lymph node measuring 17 mm short axis compared to 12 on prior.
Subcarinal lymph node adjacent to the right lower lobe bronchus
measures 19 mm increased from 9 mm on prior.  These lymph nodes are
not increased back to the level of the staging FDG PET scan
01/27/2012.
IMPRESSION: 1.  Increased linear nodular thickening extending from the right
hilum into the right lower lobe is concerning for lymphangitic
spread of carcinoma.
2. Enlarged nodular density in the right lateral pleural space is
consistent with pleural spread of carcinoma.
3..  These lesions were present on comparison PET CT scan and
hypermetabolic indicating no new disease but recurrence.
4..  Interval increase in adenopathy in the mediastinum back to the
level of the staging PET scan.

CT ABDOMEN AND PELVIS
FINDINGS: Non-IV contrast images demonstrate no focal hepatic lesion.  The
gallbladder, pancreas, spleen, adrenal glands, and kidneys are
unchanged.  There are bilateral low-density renal cysts.

The stomach, small bowel, appendix, and cecum are normal.  The
colon and rectosigmoid colon are normal.

No free fluid the pelvis.  No pelvic lymphadenopathy.
Retroperitoneal lymphadenopathy.

Review of  bone windows demonstrates no aggressive osseous lesions.
IMPRESSION: 1..  No evidence metastasis in the abdomen pelvis.
2.  Normal adrenal glands.
